# Patient Record
Sex: Female | Born: 1945
Health system: Southern US, Community
[De-identification: ages and names within clinical notes are randomized; demographics above are authoritative.]

## PROBLEM LIST (undated history)

## (undated) DIAGNOSIS — G709 Myoneural disorder, unspecified: Secondary | ICD-10-CM

## (undated) DIAGNOSIS — E785 Hyperlipidemia, unspecified: Secondary | ICD-10-CM

## (undated) DIAGNOSIS — I10 Essential (primary) hypertension: Secondary | ICD-10-CM

## (undated) DIAGNOSIS — Z87442 Personal history of urinary calculi: Secondary | ICD-10-CM

## (undated) DIAGNOSIS — K219 Gastro-esophageal reflux disease without esophagitis: Secondary | ICD-10-CM

## (undated) DIAGNOSIS — H269 Unspecified cataract: Secondary | ICD-10-CM

## (undated) DIAGNOSIS — R011 Cardiac murmur, unspecified: Secondary | ICD-10-CM

## (undated) DIAGNOSIS — M1990 Unspecified osteoarthritis, unspecified site: Secondary | ICD-10-CM

## (undated) DIAGNOSIS — D649 Anemia, unspecified: Secondary | ICD-10-CM

## (undated) DIAGNOSIS — E119 Type 2 diabetes mellitus without complications: Secondary | ICD-10-CM

## (undated) HISTORY — DX: Unspecified cataract: H26.9

## (undated) HISTORY — PX: COLONOSCOPY: SHX174

## (undated) HISTORY — DX: Cardiac murmur, unspecified: R01.1

## (undated) HISTORY — DX: Anemia, unspecified: D64.9

## (undated) HISTORY — PX: BLADDER REPAIR: SHX76

## (undated) HISTORY — DX: Type 2 diabetes mellitus without complications: E11.9

## (undated) HISTORY — DX: Myoneural disorder, unspecified: G70.9

## (undated) HISTORY — DX: Hyperlipidemia, unspecified: E78.5

## (undated) HISTORY — PX: EYE SURGERY: SHX253

## (undated) HISTORY — PX: TUBAL LIGATION: SHX77

## (undated) HISTORY — DX: Gastro-esophageal reflux disease without esophagitis: K21.9

## (undated) HISTORY — DX: Unspecified osteoarthritis, unspecified site: M19.90

## (undated) HISTORY — DX: Essential (primary) hypertension: I10

## (undated) HISTORY — PX: POLYPECTOMY: SHX149

## (undated) HISTORY — PX: CATARACT EXTRACTION W/ INTRAOCULAR LENS IMPLANT: SHX1309

---
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
## 1976-11-17 HISTORY — PX: APPENDECTOMY: SHX54

## 1976-11-17 HISTORY — PX: VAGINAL HYSTERECTOMY: SUR661

## 2000-10-15 ENCOUNTER — Other Ambulatory Visit: Admission: RE | Admit: 2000-10-15 | Discharge: 2000-10-15 | Payer: Self-pay | Admitting: Obstetrics and Gynecology

## 2002-01-20 ENCOUNTER — Other Ambulatory Visit: Admission: RE | Admit: 2002-01-20 | Discharge: 2002-01-20 | Payer: Self-pay | Admitting: Obstetrics and Gynecology

## 2009-04-26 ENCOUNTER — Encounter: Admission: RE | Admit: 2009-04-26 | Discharge: 2009-04-26 | Payer: Self-pay | Admitting: Obstetrics and Gynecology

## 2010-05-14 ENCOUNTER — Encounter: Admission: RE | Admit: 2010-05-14 | Discharge: 2010-05-14 | Payer: Self-pay | Admitting: Obstetrics and Gynecology

## 2011-06-03 ENCOUNTER — Encounter (HOSPITAL_COMMUNITY): Payer: Medicare Other

## 2011-06-03 ENCOUNTER — Other Ambulatory Visit: Payer: Self-pay | Admitting: Obstetrics and Gynecology

## 2011-06-03 LAB — CBC
HCT: 40.7 % (ref 36.0–46.0)
Hemoglobin: 13.4 g/dL (ref 12.0–15.0)
MCH: 28.4 pg (ref 26.0–34.0)
MCHC: 32.9 g/dL (ref 30.0–36.0)
MCV: 86.2 fL (ref 78.0–100.0)
Platelets: 321 10*3/uL (ref 150–400)
RBC: 4.72 MIL/uL (ref 3.87–5.11)
RDW: 13.1 % (ref 11.5–15.5)
WBC: 7.3 10*3/uL (ref 4.0–10.5)

## 2011-06-03 LAB — COMPREHENSIVE METABOLIC PANEL
AST: 11 U/L (ref 0–37)
Albumin: 3.4 g/dL — ABNORMAL LOW (ref 3.5–5.2)
GFR calc non Af Amer: >60 mL/min (ref >60)
Potassium: 4.2 mEq/L (ref 3.5–5.1)
Total Bilirubin: 0.3 mg/dL (ref 0.3–1.2)

## 2011-06-09 ENCOUNTER — Ambulatory Visit (HOSPITAL_COMMUNITY)
Admission: RE | Admit: 2011-06-09 | Discharge: 2011-06-10 | Disposition: A | Discharge status: Home / Self Care | Payer: Medicare Other | Source: Ambulatory Visit | Attending: Obstetrics and Gynecology | Admitting: Obstetrics and Gynecology

## 2011-06-09 DIAGNOSIS — F17200 Nicotine dependence, unspecified, uncomplicated: Secondary | ICD-10-CM | POA: Insufficient documentation

## 2011-06-09 DIAGNOSIS — N210 Calculus in bladder: Secondary | ICD-10-CM | POA: Insufficient documentation

## 2011-06-09 DIAGNOSIS — Z01812 Encounter for preprocedural laboratory examination: Secondary | ICD-10-CM | POA: Insufficient documentation

## 2011-06-09 DIAGNOSIS — N8111 Cystocele, midline: Secondary | ICD-10-CM | POA: Insufficient documentation

## 2011-06-09 DIAGNOSIS — N393 Stress incontinence (female) (male): Secondary | ICD-10-CM | POA: Insufficient documentation

## 2011-06-09 DIAGNOSIS — N816 Rectocele: Secondary | ICD-10-CM | POA: Insufficient documentation

## 2011-06-09 DIAGNOSIS — Z90710 Acquired absence of both cervix and uterus: Secondary | ICD-10-CM | POA: Insufficient documentation

## 2011-06-09 NOTE — H&P (Signed)
  NAMESANTANNA, Lindsay Villarreal NO.:  [id number]  MEDICAL RECORD NO.:  [id number]  LOCATION:  PADM                         FACILITY:  [facility]  PHYSICIAN:  Huel Cote, M.D. DATE OF BIRTH:  06-25-1946  DATE OF ADMISSION:  06/03/2011 DATE OF DISCHARGE:                             HISTORY & PHYSICAL   Surgery is taken place at the [facility] at 7:30 a.m. on June 09, 2011.  HISTORY OF PRESENT ILLNESS:  The patient is a 65 year old G3, P3, who is coming in for a scheduled anterior-posterior repair and placement of a suburethral sling for issues with a large cystocele and stress urinary incontinence.  The patient has been having these issues for years; however, recently stress urinary incontinence has become worse and the patient has to wear a pad daily.  She had a workup for this problem with Dr. Ihor Gully and he agrees that a sling placement is warranted.  She also has a very large cystocele causing pressure and symptoms, and is not sexually active nor plans to be in the future.  PAST MEDICAL HISTORY:  None.  PAST SURGICAL HISTORY:  Vaginal hysterectomy in 1978 for prolapse.  PAST OB HISTORY:  NSVD x3.  PAST GYN HISTORY: 1. No abnormal Pap smears. 2. Cystocele as stated.  FAMILY HISTORY:  Breast cancer in her sister in her 15s.  No colon cancer.  Mother had MI.  SOCIAL HISTORY:  She is a smoker of a half-a-pack a day and is divorced and not currently sexually active.  PHYSICAL EXAMINATION:  VITAL SIGNS:  On physical exam, her blood pressure is 130/80. CARDIAC EXAM:  Regular rate and rhythm. LUNGS:  Clear. ABDOMEN:  Soft and nontender. PELVIC EXAM:  She has a visible cystocele, bulging out of the vagina, grade 4.  External genitalia are normal.  There is some support at her vaginal cuff with mild prolapse there as well. RECTAL:  Shows a mild-to-moderate rectocele with no masses.  IMPRESSION/PLAN:  The patient was counseled as to the risks and  benefits of surgery including bleeding, infection, and possible damage to bowel or bladder.  She has had a separate workup for her stress urinary incontinence and will have a sling placed at the time of our procedure. We did discuss possible concurrent sacrospinous ligament fixation; however, given that she is not sexually active nor ever plans to be.  We discussed that we can probably be aggressive with her anterior repair and if the vagina is somewhat shortened, it will not be a problem for her and she is agreeable with this plan.  We will proceed with surgery as stated at [facility], 7:30 a.m. on June 09, 2011.     Huel Cote, M.D.     KR/MEDQ  D:  06/06/2011  T:  06/06/2011  Job:  [id number]  Electronically Signed by Huel Cote M.D. on 06/09/2011 05:37:24 PM

## 2011-06-09 NOTE — Op Note (Signed)
Lindsay Villarreal, BLOUGH NO.:  [id number]  MEDICAL RECORD NO.:  [id number]  LOCATION:  DAYL                         FACILITY:  [facility]  PHYSICIAN:  Huel Cote, M.D. DATE OF BIRTH:  1946/08/01  DATE OF PROCEDURE: DATE OF DISCHARGE:                              OPERATIVE REPORT   PREOPERATIVE DIAGNOSES: 1. Grade 4 cystocele. 2. Stress urinary incontinence. 3. Small rectocele.  POSTOPERATIVE DIAGNOSES: 1. Grade 4 cystocele. 2. Stress urinary incontinence. 3. Small rectocele.  PROCEDURE:  Anterior-posterior repair and cystoscopy, transobturator sling placement by Dr. Ihor Gully.  SURGEON:  Huel Cote, M.D. and Veverly Fells. Vernie Ammons, M.D.  ASSISTANT:  Malachi Pro. Ambrose Mantle, M.D.  ANESTHESIA:  General.  FINDINGS:  Grade 4 cystocele and a small rectocele.  SPECIMENS:  None.  COMPLICATIONS:  There were no known complications.  ESTIMATED BLOOD LOSS:  100 mL.  URINE OUTPUT:  Approximately 100 mL, clear urine.  IV FLUIDS:  Approximately 1800 mL LR.  PROCEDURE:  The patient was taken to the operating room where general anesthesia was obtained without difficulty.  She was then prepped and draped in the normal sterile fashion in dorsal lithotomy position.  A weighted speculum was then placed and attention was turned to the vaginal cuff which was grasped with Allis clamps x2.  Once these were placed, the midline was injected with a solution of 0.5% Marcaine with epinephrine all along the midline up to approximately 2 cm below the urethral meatus.  The Mayo scissors were then utilized to open a small portion of the vaginal mucosa at the vaginal cuff.  The Jorgenson scissors were then utilized to dissect along the midline of the vaginal mucosa and separate the vaginal mucosa from the underlying pubovesical fascia.  This was carried up to the level of the urethral meatus again approximately 2 cm below.  Once this was completed, the vaginal mucosa flaps were  reflected laterally and the pubovesical fascia dissected off them and the cystocele reduced medially.  With this, accomplished bilaterally.  0 Vicryl interrupted sutures were utilized in interrupted fashion in figure-of-eight formation to reapproximate the pubovesical fascia.  This was completed and the cystocele completely reduced.  The vaginal mucosa was then trimmed with the Mayo scissors and the midline closed with 2-0 Vicryl.  The first few stitches near the urethral meatus were a figure-of-eight in nature and then a running suture was placed for the posterior portion of the closure.  With this then completed, attention was turned to the posterior vaginal wall.  A very small rectocele was noted and the vaginal mucosa was grasped just inside the introitus with Allis clamps.  This was also injected with a dilute solution of 1% with epinephrine and Marcaine along the midline.  With this completed, the Mayo scissors were utilized to open a small portion of the vaginal mucosa and this was then dissected from the underlying rectovaginal fascia, carried up to the level of the vaginal cuff.  Once this was completed, the mucosa was reflected laterally and the rectovaginal fascia was dissected off it with the Jorgenson scissors. This was completed and the rectocele was reapproximated with interrupted figure-of-eight sutures of 0 Vicryl and a small  rectocele reduced.  Once this was completed, the rectal mucosa was closed with a running switched on 2-0 Vicryl.  At the conclusion of the closure, a rectal exam was performed and found to be negative with a good reduction of small cystorectocele.  The urine in the Foley was clear and at this point, the case was turned over to Dr. Ihor Gully for her obturator sling placement.     Huel Cote, M.D.     KR/MEDQ  D:  06/09/2011  T:  06/09/2011  Job:  [id number]  Electronically Signed by Huel Cote M.D. on 06/09/2011 05:37:26 PM

## 2011-06-10 LAB — CBC
HCT: 35.3 % — ABNORMAL LOW (ref 36.0–46.0)
Hemoglobin: 11.3 g/dL — ABNORMAL LOW (ref 12.0–15.0)
MCH: 27.9 pg (ref 26.0–34.0)
MCHC: 32 g/dL (ref 30.0–36.0)
MCV: 87.2 fL (ref 78.0–100.0)
Platelets: 279 10*3/uL (ref 150–400)
RBC: 4.05 MIL/uL (ref 3.87–5.11)
RDW: 13.2 % (ref 11.5–15.5)
WBC: 12.4 10*3/uL — ABNORMAL HIGH (ref 4.0–10.5)

## 2011-06-10 NOTE — Op Note (Signed)
NAMEBRINDLEY, Lindsay Villarreal                ACCOUNT NO.:  [id number]  MEDICAL RECORD NO.:  [id number]  LOCATION:  DAYL                         FACILITY:  [facility]  PHYSICIAN:  Pauline Trainer C. Vernie Ammons, M.D.  DATE OF BIRTH:  1946/07/01  DATE OF PROCEDURE:  06/09/2011 DATE OF DISCHARGE:                              OPERATIVE REPORT   PREOPERATIVE DIAGNOSES: 1. Stress urinary incontinence. 2. Bladder calculi.  POSTOPERATIVE DIAGNOSES: 1. Stress urinary incontinence. 2. Bladder calculi.  PROCEDURE: 1. Transobturator sling. 2. Cystoscopy with removal of bladder calculi.  SURGEON:  Abrial Arrighi C. Vernie Ammons, M.D.  ANESTHESIA:  General.  SPECIMENS:  Bladder calculi to patient.  DRAINS:  A 16-French Foley catheter.  BLOOD LOSS:  Minimal.  COMPLICATIONS:  None.  INDICATIONS:  The patient is a 65 year old female with significant cystocele and rectocele as well as stress urinary incontinence, documented by urodynamics.  I have discussed the procedure of transobturator sling with the patient as well as its risks, complications, and alternatives.  She understands and has elected to proceed.  DESCRIPTION OF OPERATION:  After informed consent, the patient was brought to the major OR, placed on table, administered general anesthesia and then moved to the dorsal lithotomy position.  Her genitalia and vagina was sterilely prepped and draped and a 16-French Foley catheter was placed in the bladder.  An official time-out was first performed by Dr. Senaida Ores and then she proceeded with her portion of the surgery which included anterior-posterior repair for a grade 4 cystocele and small rectocele.  This was dictated by her separately.  After she had completed her portion of the operation and closed the vaginal incision, I then proceeded with my portion of the procedure and a second time official time-out was then performed.  I injected 0.5% Marcaine with epinephrine submucosally in the midline after palpating  the midurethral region.  After allowing adequate time for epinephrine effect, a midline incision was then made over the midurethra and then the Metzenbaum scissors were used to dissect laterally to the urethra on each side.  I then was able to insinuate my index finger through the incision and could palpate the urethra with a Foley catheter in place as well as the undersurface of the symphysis pubis.  I then palpated the obturator ring and made a Keelin Neville at the medial aspect of the obturator fossa with a marking pen at the level of the clitoris. This was performed on right and left sides and then a stab incision was made in this location.  The bladder was then fully drained and the transobturator trocar was then passed through the skin incision, through the obturator fossa, and then back behind the symphysis pubis, hugging the symphysis and advanced under direct digital guidance out through the midurethral level.  This was performed first on the left and then on the right sides.  I then fixed the sling to the transobturator trocar and withdrew this back through the skin incision.  The Foley catheter was then removed and cystoscopy was performed.  The 22 French cystoscope with 70 degree lens was passed into the bladder and the bladder was fully and systematically inspected.  There were no tumors or inflammatory lesions identified.  Ureteral orifices were of normal configuration and position.  Multiple millimeters and submillimeter stones were seen on the floor of the bladder.  There is no evidence of foreign body or injury to the bladder from the anterior repair.  I used the Microvasive evacuator to then irrigate out all of the small stones.  Reinspection then revealed the bladder was free of all stones and as I inspected the bladder further, I noted no injury from the transobturator trocar and the urethra was also noted be normal, as the scope was removed.  The 16 French Foley catheter  was re-inserted in the bladder and I then placed a forceps beneath the urethra and removed the sheathing from the sling first on the left side and then on the right side.  As I removed the forceps, I noted no tension and the sling laid against the midurethra.  The wound was then irrigated with antibiotic solution and I closed the incision with a running 2-0 Vicryl suture.  A 2-inch iodoform gauze with Estrace cream was then used as a vaginal packing and the Foley catheter was connected to close system drainage.  The patient was awakened and taken to recovery room in stable and satisfactory condition.  She tolerated procedure well with no intraoperative complications.     Taressa Rauh C. Vernie Ammons, M.D.     MCO/MEDQ  D:  06/09/2011  T:  06/09/2011  Job:  [id number]  cc:   Huel Cote, M.D. Fax: [phone]  Electronically Signed by Ihor Gully M.D. on 06/10/2011 10:26:32 AM

## 2011-11-07 ENCOUNTER — Telehealth: Payer: Self-pay | Admitting: Hematology and Oncology

## 2011-11-07 NOTE — Telephone Encounter (Signed)
S/w pt re appt for 1/9 @ 1 pm w/LO.

## 2011-11-13 ENCOUNTER — Telehealth: Payer: Self-pay | Admitting: Hematology and Oncology

## 2011-11-13 NOTE — Telephone Encounter (Signed)
Referred by Dr. Rudi Heap Dx- Low Hgb

## 2011-11-18 HISTORY — PX: COLONOSCOPY: SHX174

## 2011-11-26 ENCOUNTER — Ambulatory Visit (HOSPITAL_BASED_OUTPATIENT_CLINIC_OR_DEPARTMENT_OTHER): Payer: Medicare Other

## 2011-11-26 ENCOUNTER — Telehealth: Payer: Self-pay | Admitting: Hematology and Oncology

## 2011-11-26 ENCOUNTER — Ambulatory Visit (HOSPITAL_BASED_OUTPATIENT_CLINIC_OR_DEPARTMENT_OTHER): Payer: Medicare Other | Admitting: Hematology and Oncology

## 2011-11-26 ENCOUNTER — Ambulatory Visit: Payer: Medicare Other

## 2011-11-26 ENCOUNTER — Other Ambulatory Visit: Payer: Self-pay | Admitting: Hematology and Oncology

## 2011-11-26 ENCOUNTER — Encounter: Payer: Self-pay | Admitting: Internal Medicine

## 2011-11-26 VITALS — BP 143/79 | HR 73 | Temp 98.2°F | Ht 64.5 in | Wt 168.9 lb

## 2011-11-26 DIAGNOSIS — M810 Age-related osteoporosis without current pathological fracture: Secondary | ICD-10-CM

## 2011-11-26 DIAGNOSIS — D649 Anemia, unspecified: Secondary | ICD-10-CM

## 2011-11-26 DIAGNOSIS — E785 Hyperlipidemia, unspecified: Secondary | ICD-10-CM

## 2011-11-26 DIAGNOSIS — E1169 Type 2 diabetes mellitus with other specified complication: Secondary | ICD-10-CM | POA: Insufficient documentation

## 2011-11-26 LAB — CBC WITH DIFFERENTIAL/PLATELET
Basophils Absolute: 0 10*3/uL (ref 0.0–0.1)
EOS%: 1 % (ref 0.0–7.0)
Eosinophils Absolute: 0.1 10*3/uL (ref 0.0–0.5)
MCHC: 32.2 g/dL (ref 31.5–36.0)
NEUT#: 4.9 10*3/uL (ref 1.5–6.5)
NEUT%: 64.4 % (ref 38.4–76.8)
RDW: 16.3 % — ABNORMAL HIGH (ref 11.2–14.5)
WBC: 7.6 10*3/uL (ref 3.9–10.3)

## 2011-11-26 LAB — COMPREHENSIVE METABOLIC PANEL
ALT: 12 U/L (ref 0–35)
Calcium: 9.3 mg/dL (ref 8.4–10.5)
Chloride: 101 mEq/L (ref 96–112)
Glucose, Bld: 100 mg/dL — ABNORMAL HIGH (ref 70–99)
Potassium: 3.5 mEq/L (ref 3.5–5.3)
Sodium: 137 mEq/L (ref 135–145)
Total Bilirubin: 0.2 mg/dL — ABNORMAL LOW (ref 0.3–1.2)

## 2011-11-26 LAB — URINALYSIS, MICROSCOPIC - CHCC
Bilirubin (Urine): NEGATIVE
Glucose: NEGATIVE g/dL
Leukocyte Esterase: NEGATIVE
pH: 6 (ref 4.6–8.0)

## 2011-11-26 LAB — MORPHOLOGY
PLT EST: ADEQUATE
RBC Comments: NORMAL

## 2011-11-26 NOTE — Telephone Encounter (Signed)
gv pt appt for 1/29. Per LO 1/29 @ 9:15 am w/RJ not 1/15. Also gv pt appts for colonoscopy and nurse visit @ [facility] for 1/14 and 1/24.

## 2011-11-26 NOTE — Progress Notes (Signed)
CC:   Lindsay Villarreal, M.D. Lindsay Floor, NP  REFERRING PHYSICIAN:  Ernestina Villarreal, M.D.  IDENTIFYING STATEMENT:  The patient is a 66 year old woman seen at request of Dr. Christell Constant with anemia.  HISTORY OF PRESENT ILLNESS:  The patient states that 6 weeks ago that she was told that she had anemia. On 10/29/2011, office visit notes reports a CBC results as follows: White blood cells 6.4, hemoglobin 10.6, hematocrit 34.2, and platelets 348.  The patient was subsequently placed on prescription iron which she has tolerated with very minimal complaints.  She has noted declining energy levels over the last year, but is able to carry on with her activities of daily living.  She denies weight loss.  She denies adenopathy.  She denies noting rectal bleeding or hematuria.  She states she eats a well-balanced diet.  Review of CBCs 9 months ago.  05/24/2011 white count was 7.3, hemoglobin 13.4, hematocrit 40.7, and platelets 321.  PAST MEDICAL HISTORY: 1. GERD. 2. History of hyperlipidemia. 3. History of right cataract surgery. 4. Status post hysterectomy.  ALLERGIES:  Codeine derivatives.  MEDICATIONS:  Omeprazole 40 mg daily, Lipitor 40 mg daily, and ferrous fumarate 1 tablets daily.  SOCIAL HISTORY:  The patient is divorced with 3 children.  She lives in [place], [place].  She is a smoker and smoked half-a-pack a day for 50 years.  She denies alcohol use.  She is a retired Arboriculturist.  FAMILY HISTORY:  The patient's father had stomach cancer.  The patient's sister had breast cancer.  There is no family history for colon cancer or anemia.  HEALTH MAINTENANCE:  Her last colonoscopy was roughly 6-7 years ago.  REVIEW OF SYSTEMS:  Constitutional: Denies fever, chills, night sweats, anorexia, weight loss, or pain.  GI: Denies nausea, vomiting, abdominal pain, diarrhea, melena, or hematochezia.  Cardiovascular: Denies chest pain, PND, orthopnea, or ankle swelling.   Respirations: Denies cough, hemoptysis, wheeze, or shortness of breath.  Skin: No bruising or bleeding.  Musculoskeletal: Denies joint aches or muscle pains. Neurologic: Denies headache, vision changes, or extremity weakness.  The rest of the review of systems negative.  PHYSICAL EXAM:  Vitals: Pulse 73, blood pressure 143/79, temperature 98.2, respirations 20, and weight 168 pounds.  HEENT: Head is atraumatic, normocephalic.  Extraocular muscles intact.  Sclerae anicteric.  Mouth: Moist without thrush.  Neck:  Supple.  Chest:  Clear to percussion and auscultation.  CVS: 1st and 2nd heart sounds present. No added sounds or murmurs.  Abdomen:  Soft, nontender.  No hepatosplenomegaly.  Bowel sounds present.  Lymph nodes: No adenopathy. Extremities: No calf tenderness.  Pulses present and symmetrical.  CNS: Nonfocal.  IMPRESSION AND PLAN:  Lindsay Villarreal is a pleasant 66 year old woman who was recently found to be anemic. She was placed on prescription iron 6 weeks ago.  Her last colonoscopy was 6 years ago.  She denies history for overt blood loss.  She is not that symptomatic.  She will receive an anemia workup which will include repeating her CBC and differential.  We will review morphology.  We will obtain iron studies.  We will rule out hemolysis with Coombs and haptoglobin.  We will rule out plasma cell dyscrasia.  She also requires a urinalysis.  She has not had a colonoscopy in a while. She has seen the Bowel/GI in the past and thus will assist her with a referral.  She is agreeable to this.  In the interim, I have asked that she continue  with iron which she is tolerating well.  She follows up to discuss results and any additional recommendations if needed.  She had a number of questions which I answered.  Spent more than half the time discussing and coordinating care.    ______________________________ Laurice Record, M.D. LIO/MEDQ  D:  11/26/2011  T:  11/26/2011  Job:   [id number]

## 2011-11-26 NOTE — Progress Notes (Signed)
Dr.     Rudi Heap      -      Primary @ [facility]  [facility]icine. Dr.     Huel Cote       -        GYN. Dr.    Raylene Miyamoto        -      Urologist.   [facility]    Pharmacy   In    [place].  Cell      Phone       [phone]  Leave  Messages  On   HOME   Voice  Mail      -    No  Cell    Voice   Mail.

## 2011-11-26 NOTE — Progress Notes (Signed)
This office note has been dictated.

## 2011-12-01 ENCOUNTER — Ambulatory Visit (AMBULATORY_SURGERY_CENTER): Payer: Medicare Other | Admitting: *Deleted

## 2011-12-01 ENCOUNTER — Telehealth: Payer: Self-pay | Admitting: *Deleted

## 2011-12-01 VITALS — Ht 64.0 in | Wt 170.0 lb

## 2011-12-01 DIAGNOSIS — Z1211 Encounter for screening for malignant neoplasm of colon: Secondary | ICD-10-CM | POA: Diagnosis not present

## 2011-12-01 LAB — SPEP & IFE WITH QIG
Albumin ELP: 54.4 % — ABNORMAL LOW (ref 55.8–66.1)
Alpha-2-Globulin: 14.9 % — ABNORMAL HIGH (ref 7.1–11.8)
Beta 2: 5.4 % (ref 3.2–6.5)
Beta Globulin: 6.9 % (ref 4.7–7.2)
Gamma Globulin: 14.2 % (ref 11.1–18.8)

## 2011-12-01 LAB — HAPTOGLOBIN: Haptoglobin: 261 mg/dL — ABNORMAL HIGH (ref 30–200)

## 2011-12-01 LAB — DIRECT ANTIGLOBULIN TEST (NOT AT ARMC)
DAT (Complement): NEGATIVE
DAT IgG: NEGATIVE

## 2011-12-01 MED ORDER — PEG-KCL-NACL-NASULF-NA ASC-C 100 G PO SOLR: ORAL | Status: DC

## 2011-12-01 NOTE — Telephone Encounter (Signed)
Pt instructed to stop Iron supplement 12/04/11

## 2011-12-11 ENCOUNTER — Encounter: Payer: Self-pay | Admitting: Internal Medicine

## 2011-12-11 ENCOUNTER — Ambulatory Visit (AMBULATORY_SURGERY_CENTER): Payer: Medicare Other | Admitting: Internal Medicine

## 2011-12-11 VITALS — BP 145/77 | HR 74 | Temp 96.5°F | Resp 96 | Ht 64.0 in | Wt 170.0 lb

## 2011-12-11 DIAGNOSIS — D126 Benign neoplasm of colon, unspecified: Secondary | ICD-10-CM | POA: Diagnosis not present

## 2011-12-11 DIAGNOSIS — Z1211 Encounter for screening for malignant neoplasm of colon: Secondary | ICD-10-CM | POA: Diagnosis not present

## 2011-12-11 MED ORDER — SODIUM CHLORIDE 0.9 % IV SOLN: 500.0000 mL | INTRAVENOUS | Status: DC

## 2011-12-11 NOTE — Patient Instructions (Signed)
Green and blue discharge instructions reviewed with patient and care partner.  Impression/recommendations:  Polyps (handout given) Hemorrhoids (handout given)  Repeat colonoscopy in 3 years.  Resume medications as you were taking them prior to your procedure.

## 2011-12-11 NOTE — Progress Notes (Signed)
Patient did not experience any of the following events: a burn prior to discharge; a fall within the facility; wrong site/side/patient/procedure/implant event; or a hospital transfer or hospital admission upon discharge from the facility. (G8907) Patient did not have preoperative order for IV antibiotic SSI prophylaxis. (G8918)  

## 2011-12-11 NOTE — Op Note (Signed)
[facility] [address] [place], [place]  [address]  COLONOSCOPY PROCEDURE REPORT  PATIENT:  Lindsay Villarreal, Lindsay Villarreal  MR#:  [id number] BIRTHDATE:  [birth year], 65 yrs. old  GENDER:  female ENDOSCOPIST:  Wilhemina Bonito. Eda Keys, MD REF. BY:  Arlan Organ, M.D. PROCEDURE DATE:  12/11/2011 PROCEDURE:  Colonoscopy with snare polypectomy x 8 ASA CLASS:  Class II INDICATIONS:  Routine Risk Screening (negative exam 2003); normocytic anemia MEDICATIONS:   MAC sedation, administered by CRNA, propofol (Diprivan) 420 mcg IV  DESCRIPTION OF PROCEDURE:   After the risks benefits and alternatives of the procedure were thoroughly explained, informed consent was obtained.  Digital rectal exam was performed and revealed no abnormalities.   The LB CF-H180AL [id number] endoscope was introduced through the anus and advanced to the cecum, which was identified by both the appendix and ileocecal valve, without limitations.  The quality of the prep was excellent, using MoviPrep.  The instrument was then slowly withdrawn as the colon was fully examined. <<PROCEDUREIMAGES>>  FINDINGS:  There were multiple polyps, all < 6mm, identified and removed from the ascending (1), transverse (5), descending (1) and sigmoid colon (1). Polyps were snared without cautery. Retrieval was successful.  Otherwise normal colonoscopy without other polyps, masses, vascular ectasias, or inflammatory changes. Retroflexed views in the rectum revealed internal hemorrhoids. The time to cecum =  3:03  minutes. The scope was then withdrawn in 21:35  minutes from the cecum and the procedure completed.  COMPLICATIONS:  None  ENDOSCOPIC IMPRESSION: 1) Polyps, multiple ascending colon to sigmoid colon - removed 2) Otherwise normal colonoscopy 3) Internal hemorrhoids  RECOMMENDATIONS: 1) Repeat Colonoscopy in 3 years.  ______________________________ Wilhemina Bonito. Eda Keys, MD  CC:  Arlan Organ, MD; Rudi Heap, MD; The  Patient  n. eSIGNED:   Wilhemina Bonito. Eda Keys at 12/11/2011 10:15 AM  Paulla Dolly, [phone]

## 2011-12-12 ENCOUNTER — Telehealth: Payer: Self-pay | Admitting: *Deleted

## 2011-12-12 NOTE — Telephone Encounter (Signed)

## 2011-12-16 ENCOUNTER — Ambulatory Visit (HOSPITAL_BASED_OUTPATIENT_CLINIC_OR_DEPARTMENT_OTHER): Payer: Medicare Other | Admitting: Lab

## 2011-12-16 ENCOUNTER — Ambulatory Visit (HOSPITAL_BASED_OUTPATIENT_CLINIC_OR_DEPARTMENT_OTHER): Payer: Medicare Other | Admitting: Physician Assistant

## 2011-12-16 ENCOUNTER — Encounter: Payer: Self-pay | Admitting: Internal Medicine

## 2011-12-16 VITALS — BP 139/77 | HR 77 | Temp 97.7°F | Ht 64.0 in | Wt 170.4 lb

## 2011-12-16 DIAGNOSIS — E785 Hyperlipidemia, unspecified: Secondary | ICD-10-CM | POA: Diagnosis not present

## 2011-12-16 DIAGNOSIS — D649 Anemia, unspecified: Secondary | ICD-10-CM

## 2011-12-16 DIAGNOSIS — F17200 Nicotine dependence, unspecified, uncomplicated: Secondary | ICD-10-CM | POA: Diagnosis not present

## 2011-12-16 LAB — IRON AND TIBC
%SAT: 13 % — ABNORMAL LOW (ref 20–55)
Iron: 49 ug/dL (ref 42–145)
UIBC: 334 ug/dL (ref 125–400)

## 2011-12-16 LAB — FERRITIN: Ferritin: 26 ng/mL (ref 10–291)

## 2011-12-16 NOTE — Progress Notes (Signed)
CC:   Ernestina Penna, M.D. Paulene Floor, NP  IDENTIFYING STATEMENT:  Lindsay Villarreal is a 66 year old white female with anemia who presents for followup.  INTERIM HISTORY:  Ms. Delira reports since her last clinic visit on July 27, 2011 she did have a colonoscopy under the care of Dr. Yancey Flemings.  This was on December 11, 2011.  The colonoscopy revealed multiple polyps in the ascending colon to sigmoid colon which were removed, otherwise normal colonoscopy.  There were also internal hemorrhoids. Recommendation was for repeat colonoscopy in 3 years time.  The patient states that she had to stop taking her oral iron for a week prior to this procedure, but she has resumed iron 1 tab p.o. daily and is tolerating this without any issues with nausea, abdominal pain or constipation.  She has not noted any rectal bleeding.  She does report overall normal energy level and has had no fevers, chills, or night sweats.  She does report occasional dyspnea with exertion, but states that she does continue to smoke approximately 1/2 pack of cigarettes per day.  She has had normal appetite, no dysuria, no frequency or hematuria.  No alteration in sensation or balance or swelling of extremities.  Current medications are reviewed and recorded.  PHYSICAL EXAM:  Temperature is 97.7, heart rate 77, respirations 20, blood pressure 139/77, weight 170.4 pounds.  General:  This is a well- developed, well-nourished white female, in no acute distress.  HEENT: Sclerae nonicteric.  There is no oral thrush or mucositis.  Skin:  No rashes or lesions.  Lymph:  No cervical, supraclavicular, axillary, or inguinal lymphadenopathy.  Cardiac:  Regular rate and rhythm without murmurs or gallops.  Peripheral pulses are 2+.  Chest:  Lungs clear to auscultation.  Abdomen:  Positive bowel sounds, soft, nontender, nondistended.  No organomegaly.  Extremities:  Without edema, cyanosis or calf tenderness.  Neurologic:  Alert and  oriented x3.  Strength, sensation, and coordination all grossly intact.  LABORATORY STUDIES:  Laboratory data from December 16, 2011 CBC with diff revealed white blood count of 7.6, hemoglobin 12.2, hematocrit 37.9, platelets of 346, ANC of 4.9, and MCV of 81.  Chemistries revealed a sodium of 137, potassium 3.5, chloride 101, BUN of 9, creatinine 0.67, glucose of 100, bilirubin 0.2, alkaline phosphatase 110, AST 13, ALT 12, total protein 7.3, albumin 3.7, and calcium of 9.3.  Quantitative immunoglobulins revealed an IgA of 264, IgG of 1150, IgM of 65.  M spike was not detected.  ISE interpretation:  No monoclonal protein identified.  Haptoglobin of 261.  IMPRESSION/PLAN: 1. Lindsay Villarreal is a 66 year old white female referred to our office     by Dr. Christell Constant for evaluation of anemia.  The patient has recently     had a colonoscopy which did not reveal any evidence of malignancy.     She is currently on oral iron 1 tab p.o. daily although she has     missed a week of this to prepare for her colonoscopy.  Per Dr.     Dalene Carrow, we will obtain a ferritin and iron IBC today and the     patient will be contacted with results.  She will also be advised     if she needs to take more iron than just 1 tab p.o. daily, but for     now, she is to continue on current dose. 2. Also per Dr. Dalene Carrow, the patient will be scheduled for followup in     6  months' time.  A few days before this we     will reassess CBC with diff, CMET, ferritin, iron IBC.  Patient is     advised to call in the interim if any questions or problems.     Patient is also counseled on benefit of cigarette smoking     cessation.    ______________________________ Michail Sermon, MSN, ANP, BC RH/MEDQ  D:  12/16/2011  T:  12/16/2011  Job:  [id number]

## 2011-12-16 NOTE — Progress Notes (Signed)
This office note has been dictated.

## 2011-12-29 DIAGNOSIS — J069 Acute upper respiratory infection, unspecified: Secondary | ICD-10-CM | POA: Diagnosis not present

## 2011-12-29 DIAGNOSIS — R070 Pain in throat: Secondary | ICD-10-CM | POA: Diagnosis not present

## 2012-02-02 DIAGNOSIS — T148 Other injury of unspecified body region: Secondary | ICD-10-CM | POA: Diagnosis not present

## 2012-02-02 DIAGNOSIS — T148XXA Other injury of unspecified body region, initial encounter: Secondary | ICD-10-CM | POA: Diagnosis not present

## 2012-02-02 DIAGNOSIS — W57XXXA Bitten or stung by nonvenomous insect and other nonvenomous arthropods, initial encounter: Secondary | ICD-10-CM | POA: Diagnosis not present

## 2012-04-26 DIAGNOSIS — Z961 Presence of intraocular lens: Secondary | ICD-10-CM | POA: Diagnosis not present

## 2012-04-26 DIAGNOSIS — H2510 Age-related nuclear cataract, unspecified eye: Secondary | ICD-10-CM | POA: Diagnosis not present

## 2012-05-06 DIAGNOSIS — H2510 Age-related nuclear cataract, unspecified eye: Secondary | ICD-10-CM | POA: Diagnosis not present

## 2012-05-24 DIAGNOSIS — H2510 Age-related nuclear cataract, unspecified eye: Secondary | ICD-10-CM | POA: Diagnosis not present

## 2012-05-24 DIAGNOSIS — IMO0002 Reserved for concepts with insufficient information to code with codable children: Secondary | ICD-10-CM | POA: Diagnosis not present

## 2012-06-04 ENCOUNTER — Other Ambulatory Visit: Payer: Medicare Other | Admitting: Lab

## 2012-06-04 ENCOUNTER — Telehealth: Payer: Self-pay | Admitting: Hematology and Oncology

## 2012-06-04 ENCOUNTER — Ambulatory Visit (HOSPITAL_BASED_OUTPATIENT_CLINIC_OR_DEPARTMENT_OTHER): Payer: Medicare Other | Admitting: Lab

## 2012-06-04 DIAGNOSIS — D649 Anemia, unspecified: Secondary | ICD-10-CM | POA: Diagnosis not present

## 2012-06-04 LAB — CBC WITH DIFFERENTIAL/PLATELET
BASO%: 1.1 % (ref 0.0–2.0)
EOS%: 1 % (ref 0.0–7.0)
MCH: 28.6 pg (ref 25.1–34.0)
MCHC: 33.2 g/dL (ref 31.5–36.0)
RDW: 13.7 % (ref 11.2–14.5)
lymph#: 1.9 10*3/uL (ref 0.9–3.3)

## 2012-06-04 LAB — BASIC METABOLIC PANEL
Chloride: 103 mEq/L (ref 96–112)
Creatinine, Ser: 0.69 mg/dL (ref 0.50–1.10)
Potassium: 4.4 mEq/L (ref 3.5–5.3)

## 2012-06-04 LAB — IRON AND TIBC
Iron: 69 ug/dL (ref 42–145)
UIBC: 313 ug/dL (ref 125–400)

## 2012-06-04 NOTE — Telephone Encounter (Signed)
Pt came in and a new sch was printed for her

## 2012-06-08 ENCOUNTER — Ambulatory Visit: Payer: Medicare Other | Admitting: Hematology and Oncology

## 2012-06-14 ENCOUNTER — Telehealth: Payer: Self-pay | Admitting: Hematology and Oncology

## 2012-06-14 NOTE — Telephone Encounter (Signed)
per email from thu moved f/u from 7/30 to 8/2. S/w pt she is aware.

## 2012-06-15 ENCOUNTER — Ambulatory Visit: Payer: Medicare Other | Admitting: Family

## 2012-06-15 ENCOUNTER — Other Ambulatory Visit: Payer: Medicare Other | Admitting: Lab

## 2012-06-18 ENCOUNTER — Ambulatory Visit (HOSPITAL_BASED_OUTPATIENT_CLINIC_OR_DEPARTMENT_OTHER): Payer: Medicare Other | Admitting: Family

## 2012-06-18 ENCOUNTER — Telehealth: Payer: Self-pay | Admitting: Hematology and Oncology

## 2012-06-18 ENCOUNTER — Encounter: Payer: Self-pay | Admitting: Family

## 2012-06-18 VITALS — BP 146/83 | HR 80 | Temp 98.9°F | Resp 18 | Ht 64.0 in | Wt 174.2 lb

## 2012-06-18 DIAGNOSIS — D649 Anemia, unspecified: Secondary | ICD-10-CM

## 2012-06-18 MED ORDER — FERROUS FUMARATE 325 (106 FE) MG PO TABS: 1.0000 | ORAL_TABLET | Freq: Every day | ORAL | Status: DC

## 2012-06-18 NOTE — Patient Instructions (Addendum)
Patient ID: Lindsay Villarreal,   DOB: 06-28-1946,  MRN: [id number]   [facility] Discharge Instructions  RECOMMENDATIONS MAD BY THE CONSULTANT AND ANY TEST RESULT(S) WILL BE FORWARDED TO YOU REFERRING DOCTOR   EXAM FINDINGS BY NURSE PRACTITIONER TODAY TO REPORT TO THE CLINIC OR PRIMARY PROVIDER: N/A   Your Current Medications Are: Current Outpatient Prescriptions  Medication Sig Dispense Refill  . ferrous fumarate (HEMOCYTE - 106 MG FE) 325 (106 FE) MG TABS Take 1 tablet by mouth daily.       Current Facility-Administered Medications  Medication Dose Route Frequency Provider Last Rate Last Dose  . 0.9 %  sodium chloride infusion  500 mL Intravenous Continuous Hilarie Fredrickson, MD         INSTRUCTIONS GIVEN, DISCUSSED AND FOLLOW-UP: Take all medications as prescribed Please follow up with Dr. Christell Constant or Paulene Floor, NP regarding your medications, a physical and possibly a chest x-ray. Work on smoking cessation please.  I acknowledge that I have been informed and understand all the instructions given to me and have received a copy.  I do not have any further questions at this time, but I understand that I may call the [facility] at [phone] during business hours should I have any further questions or need assistance in obtaining follow-up care.   06/18/2012, 1:41 PM

## 2012-06-18 NOTE — Progress Notes (Signed)
Patient ID: Lindsay Villarreal, female   DOB: 21-Nov-1945, 66 y.o.   MRN: [id number] CSN: [id number]  Identifying Statement: Lindsay Villarreal is a 66 y.o. Caucasian female who presents for anemia follow-up.  CC: Lindsay Villarreal, M.D.  Lindsay Floor, NP  Interval History: Lindsay Villarreal reports since her last clinic visit on December 16, 2011 she has experienced and increase in energy.  The patient states that she has not had any unusual bleeding or bruising.  When reviewing the patient's medications with her, she stated that she has not taken her iron pills in 3 months because she ran out of the medication.  The patient has been non-compliant with other medications as well that were prescribed by her PCP.  The patient states that she has had a mammogram and a colonoscopy this year, both of which were negative. (NOTE:  Colonoscopy results are listed below, mammogram results have not been received).  The patient also states that she received both the pneumonia and influenza vaccines in 2012.  The patient stated that she had right eye cataract surgery in July, 2013.  The patient and I discussed smoking cessation and I let the patient know that there are numerous resources available to her when she is ready to quit smoking.  The patient denies any other symptomatology or complaints.   Medications: Current Outpatient Prescriptions  Medication Sig Dispense Refill  . ferrous fumarate (HEMOCYTE - 106 MG FE) 325 (106 FE) MG TABS Take 1 tablet (106 mg of iron total) by mouth daily.  30 each  6  . DISCONTD: ferrous fumarate (HEMOCYTE - 106 MG FE) 325 (106 FE) MG TABS Take 1 tablet by mouth daily.       Current Facility-Administered Medications  Medication Dose Route Frequency Provider Last Rate Last Dose  . 0.9 %  sodium chloride infusion  500 mL Intravenous Continuous Hilarie Fredrickson, MD        Allergies: Allergies  Allergen Reactions  . Codeine Other (See Comments)    Hallucinations.     Family History: Family  History  Problem Relation Age of Onset  . Stomach cancer Father   . Pancreatic cancer Father   . Liver cancer Father   . Other Sister 45    Cancer of Bile Duct  . Colon cancer Neg Hx   . Breast cancer Sister     Social History: History  Substance Use Topics  . Smoking status: Current Everyday Smoker -- 0.5 packs/day  . Smokeless tobacco: Never Used  . Alcohol Use: No    Review of Systems: 10 point review of systems was completed and is negative except as noted above.   Physical Exam: Blood pressure 146/83, pulse 80, temperature 98.9 F (37.2 C), temperature source Oral, resp. rate 18, height 5\' 4"  (1.626 m), weight 174 lb 3.2 oz (79.017 kg).  General appearance: Alert, cooperative, well nourished, well developed, no apparent distress Head: Normocephalic, without obvious abnormality, atraumatic Eyes: Conjunctivae/corneas clear,PERRLA, EOMI  Nose: Nares, septum and mucosa are normal, no drainage or sinus tenderness Neck: No adenopathy, supple, symmetrical, trachea midline, skin around thyroid area has adipose, but thyroid is not enlarged, no tenderness Resp: Diminished breath sounds LLL, CTA Cardio: Regular rate and rhythm, S1, S2 normal, no murmur, click, rub or gallop GI: Soft, non-tender, distended, hypoactive bowel sounds, no organomegaly Extremities: Extremities normal, atraumatic, no cyanosis or edema   Laboratory Data: Sodium 138, potassium 4.4, chloride 103, CO2 27, BUN 10, creatinine 0.69, calcium 9.3, glucose 172,  iron 69, U. IBC 313, TIBC 382, percent sat 18 ferritin 47, WBC 7.0, RBCs 4.79, hemoglobin 13.7, hematocrit 41.3, MCV 86.1, MCH 28.6, MCHC 33.2, RDW 13.7, platelets 295, ANC 4.6  Impression/Plan: Lindsay Villarreal is a 66 year old Caucasian female referred to our office by Dr. Christell Constant for evaluation of anemia. The patient has recently had a colonoscopy which did not reveal any evidence of malignancy, but polyps were removed from the ascend, transverse, descending  and sigmoid colon.  All of the polyps were less than 6 mm. The patient was given a prescription for oral iron 1 tab p.o. Daily.  The patient was asked to take an OTC stool softener and/or Miralax as directed if she experiences constipation while on PO iron supplementation.  The patient has been asked to visit her PCP for a physical, to discuss her running out of her medications and to get a chest x-ray.  The patient is scheduled to follow up in our office in 6 months' time and we will reassess CBC with diff, BMP, Ferritin, Iron/TIBC at that time.  The patient is advised to call in the interim if any questions or problems. Lindsay Villarreal was also counseled on benefit of cigarette smoking cessation.   Lindsay Selway  NP-C 06/18/2012, 1:59 PM

## 2012-06-18 NOTE — Telephone Encounter (Signed)
appts made and printed for pt aom °

## 2012-07-28 DIAGNOSIS — R7989 Other specified abnormal findings of blood chemistry: Secondary | ICD-10-CM | POA: Diagnosis not present

## 2012-07-28 DIAGNOSIS — D510 Vitamin B12 deficiency anemia due to intrinsic factor deficiency: Secondary | ICD-10-CM | POA: Diagnosis not present

## 2012-07-28 DIAGNOSIS — I10 Essential (primary) hypertension: Secondary | ICD-10-CM | POA: Diagnosis not present

## 2012-07-28 DIAGNOSIS — D649 Anemia, unspecified: Secondary | ICD-10-CM | POA: Diagnosis not present

## 2012-07-28 DIAGNOSIS — Z124 Encounter for screening for malignant neoplasm of cervix: Secondary | ICD-10-CM | POA: Diagnosis not present

## 2012-07-28 DIAGNOSIS — E785 Hyperlipidemia, unspecified: Secondary | ICD-10-CM | POA: Diagnosis not present

## 2012-07-28 DIAGNOSIS — Z0000 Encounter for general adult medical examination without abnormal findings: Secondary | ICD-10-CM | POA: Diagnosis not present

## 2012-08-05 DIAGNOSIS — J019 Acute sinusitis, unspecified: Secondary | ICD-10-CM | POA: Diagnosis not present

## 2012-08-05 DIAGNOSIS — E119 Type 2 diabetes mellitus without complications: Secondary | ICD-10-CM | POA: Diagnosis not present

## 2012-09-20 DIAGNOSIS — M545 Low back pain: Secondary | ICD-10-CM | POA: Diagnosis not present

## 2012-10-21 DIAGNOSIS — J209 Acute bronchitis, unspecified: Secondary | ICD-10-CM | POA: Diagnosis not present

## 2012-11-01 DIAGNOSIS — E785 Hyperlipidemia, unspecified: Secondary | ICD-10-CM | POA: Diagnosis not present

## 2012-11-01 DIAGNOSIS — E119 Type 2 diabetes mellitus without complications: Secondary | ICD-10-CM | POA: Diagnosis not present

## 2012-11-20 ENCOUNTER — Telehealth: Payer: Self-pay | Admitting: *Deleted

## 2012-11-20 NOTE — Telephone Encounter (Signed)
Per patient reassignment, I have called and spoke to the patient. Patient aware that Dr. Dalene Carrow will no longer be with the practice. I have explained to the patient that Dr. Dalene Carrow recommands that Dr. Cyndie Chime takes over her care. Patient agrees to see Dr. Cyndie Chime. Appts made and given to the patient. Old appts canceled. JMW

## 2012-11-22 ENCOUNTER — Encounter: Payer: Self-pay | Admitting: Oncology

## 2012-12-01 DIAGNOSIS — M810 Age-related osteoporosis without current pathological fracture: Secondary | ICD-10-CM | POA: Diagnosis not present

## 2012-12-01 LAB — HM DEXA SCAN

## 2012-12-03 DIAGNOSIS — Z1231 Encounter for screening mammogram for malignant neoplasm of breast: Secondary | ICD-10-CM | POA: Diagnosis not present

## 2012-12-21 ENCOUNTER — Other Ambulatory Visit (HOSPITAL_BASED_OUTPATIENT_CLINIC_OR_DEPARTMENT_OTHER): Payer: Medicare Other | Admitting: Lab

## 2012-12-21 ENCOUNTER — Ambulatory Visit (HOSPITAL_BASED_OUTPATIENT_CLINIC_OR_DEPARTMENT_OTHER): Payer: Medicare Other | Admitting: Oncology

## 2012-12-21 VITALS — BP 128/67 | HR 76 | Temp 97.7°F | Resp 20 | Ht 64.0 in | Wt 157.9 lb

## 2012-12-21 DIAGNOSIS — D649 Anemia, unspecified: Secondary | ICD-10-CM

## 2012-12-21 LAB — CBC WITH DIFFERENTIAL/PLATELET
BASO%: 0.6 % (ref 0.0–2.0)
EOS%: 0.8 % (ref 0.0–7.0)
HGB: 13.1 g/dL (ref 11.6–15.9)
MCH: 28.3 pg (ref 25.1–34.0)
MCHC: 32.9 g/dL (ref 31.5–36.0)
MONO#: 0.6 10*3/uL (ref 0.1–0.9)
RDW: 13.7 % (ref 11.2–14.5)
WBC: 8.9 10*3/uL (ref 3.9–10.3)
lymph#: 2.7 10*3/uL (ref 0.9–3.3)

## 2012-12-21 LAB — IRON AND TIBC
Iron: 96 ug/dL (ref 42–145)
TIBC: 374 ug/dL (ref 250–470)
UIBC: 278 ug/dL (ref 125–400)

## 2012-12-21 LAB — BASIC METABOLIC PANEL (CC13)
BUN: 14.4 mg/dL (ref 7.0–26.0)
Calcium: 9.6 mg/dL (ref 8.4–10.4)
Creatinine: 0.8 mg/dL (ref 0.6–1.1)
Glucose: 99 mg/dl (ref 70–99)

## 2012-12-21 NOTE — Progress Notes (Signed)
Hematology and Oncology Follow Up Visit  Lindsay Villarreal [id number] [birth year] 67 y.o. 12/21/2012 6:06 PM   Principle Diagnosis: Encounter Diagnosis  Name Primary?  Lindsay Villarreal Anemia, unspecified Yes     Interim History:  Followup visit for this pleasant 67 year old woman formerly followed in this office by Dr.Odogwu for normochromic anemia felt to be iron deficiency. Her baseline hemoglobin recorded on 06/03/2011 was 13.4 with MCV 86.2. Hemoglobin fell to 10.7 g on 10/14/2011 with repeat value of 10.6,MCV 83.6 on 10/29/2011. Stools were negative for occult blood. She first saw my partner on 12/16/2011. A number of additional studies were done. Serum iron was normal at 49, TIBC 383, percent saturation 13, and ferritin was low normal at 26. Serum protein electrophoresis showed normal quantitative immunoglobulins and no M protein. There was trace blood in the urine. There was no evidence for a hemolytic process. Coombs test was negative. Haptoglobin 261. She was started on iron supplements and referred for a colonoscopy which was done on 12/11/2011. She had multiple benign tubular adenomas which were removed. Internal hemorrhoids which were not bleeding. No other pathology. Followup labs after 6 months on iron replacement done 06/04/2012 now showed hemoglobin 13.7, hematocrit 41.3, MCV 86. Blood counts today 12/21/2012 hemoglobin 13.1, hematocrit 39.7, MCV 85.9, white count 8900, 62 neutrophils, 30 lymphocytes, 6 monocytes, platelet count 328,000. Serum iron 96 with a ferritin of 122.  Abbreviated past history: She has had a previous hysterectomy without oophorectomy. She has type 2 diabetes. Hyperlipidemia.  Social history: She worked for many years with [employer]. She had extensive exposure to insecticides [place] farm at a time when insecticides were sprayed by airplane. She is an ongoing cigarette smoker and admits that she enjoys smoking and will probably not stopp. She has 3 sisters. One  died of bile duct cancer. She has 2 children 2 sons one with diabetes and one daughter.   Medications: reviewed  Allergies:  Allergies  Allergen Reactions  . Codeine Other (See Comments)    Hallucinations.    Review of Systems: Constitutional:   No constitutional symptoms Respiratory: No cough or dyspnea Cardiovascular:  No chest pain or palpitations Gastrointestinal: No abdominal pain, no hematochezia, no melena Genito-Urinary: No hematuria, no vaginal bleeding Musculoskeletal: No muscle bone or joint pain, Neurologic: No headache or change in vision, no paresthesias Skin: No rash or ecchymosis Remaining ROS negative.  Physical Exam: Blood pressure 128/67, pulse 76, temperature 97.7 F (36.5 C), temperature source Oral, resp. rate 20, height 5\' 4"  (1.626 m), weight 157 lb 14.4 oz (71.623 kg). Wt Readings from Last 3 Encounters:  12/21/12 157 lb 14.4 oz (71.623 kg)  06/18/12 174 lb 3.2 oz (79.017 kg)  12/16/11 170 lb 6.4 oz (77.293 kg)     General appearance: Well-nourished Caucasian woman HENNT: Pharynx no erythema or exudate Lymph nodes: No lymphadenopathy Breasts: Lungs: Clear to auscultation resonant to percussion Heart: Regular rhythm no murmur Abdomen: Soft, nontender, no mass, no organomegaly Extremities: No edema, no calf tenderness Vascular: No cyanosis Neurologic: No focal deficit, sensation intact to vibration by tuning fork exam over the fingertips Skin: No rash or ecchymosis   Lab Results: Lab Results  Component Value Date   WBC 8.9 12/21/2012   HGB 13.1 12/21/2012   HCT 39.7 12/21/2012   MCV 85.9 12/21/2012   PLT 328 12/21/2012     Chemistry      Component Value Date/Time   NA 140 12/21/2012 1128   NA 138 06/04/2012 0909  K 4.0 12/21/2012 1128   K 4.4 06/04/2012 0909   CL 103 12/21/2012 1128   CL 103 06/04/2012 0909   CO2 26 12/21/2012 1128   CO2 27 06/04/2012 0909   BUN 14.4 12/21/2012 1128   BUN 10 06/04/2012 0909   CREATININE 0.8 12/21/2012 1128   CREATININE  0.69 06/04/2012 0909      Component Value Date/Time   CALCIUM 9.6 12/21/2012 1128   CALCIUM 9.3 06/04/2012 0909   ALKPHOS 110 11/26/2011 1456   AST 13 11/26/2011 1456   ALT 12 11/26/2011 1456   BILITOT 0.2* 11/26/2011 1456     Impression and Plan: Chronic, mild, stable, normochromic anemia with transient fall in hemoglobin due to to iron depletion and now corrected with oral iron supplements. No additional pathology.  Recommendation: I told her to stay on one iron tablet daily. She was at a multivitamin and this is fine. She has not been a formal followup visit in this office.    CC:. Dr. Rudi Heap   Levert Feinstein, MD 2/4/20146:06 PM

## 2012-12-21 NOTE — Patient Instructions (Signed)
Stay on 1 iron pill daily OK to take a multivitamin Return appointment only as needed if blood counts change in the future

## 2012-12-22 ENCOUNTER — Other Ambulatory Visit: Payer: Self-pay | Admitting: *Deleted

## 2012-12-22 ENCOUNTER — Other Ambulatory Visit: Payer: Medicare Other | Admitting: Lab

## 2012-12-22 DIAGNOSIS — D649 Anemia, unspecified: Secondary | ICD-10-CM

## 2012-12-22 DIAGNOSIS — M810 Age-related osteoporosis without current pathological fracture: Secondary | ICD-10-CM | POA: Diagnosis not present

## 2012-12-22 MED ORDER — POLYSACCHARIDE IRON COMPLEX 150 MG PO CAPS: 150.0000 mg | ORAL_CAPSULE | Freq: Every day | ORAL | Status: DC

## 2012-12-24 ENCOUNTER — Ambulatory Visit: Payer: Medicare Other | Admitting: Hematology and Oncology

## 2013-02-18 ENCOUNTER — Telehealth: Payer: Self-pay | Admitting: Nurse Practitioner

## 2013-02-18 ENCOUNTER — Other Ambulatory Visit: Payer: Self-pay | Admitting: Nurse Practitioner

## 2013-02-18 NOTE — Telephone Encounter (Signed)
PLEASE ADVISE.

## 2013-02-18 NOTE — Telephone Encounter (Signed)
Need chart

## 2013-03-17 ENCOUNTER — Other Ambulatory Visit: Payer: Self-pay | Admitting: Nurse Practitioner

## 2013-03-18 NOTE — Telephone Encounter (Signed)
LAST OV 12/13. LAST AIC 6.7

## 2013-03-31 NOTE — Telephone Encounter (Signed)
rx approved per Paulene Floor NP on 02/18/13

## 2013-04-22 ENCOUNTER — Other Ambulatory Visit: Payer: Self-pay | Admitting: Family Medicine

## 2013-05-05 DIAGNOSIS — L989 Disorder of the skin and subcutaneous tissue, unspecified: Secondary | ICD-10-CM | POA: Diagnosis not present

## 2013-05-05 DIAGNOSIS — L919 Hypertrophic disorder of the skin, unspecified: Secondary | ICD-10-CM | POA: Diagnosis not present

## 2013-05-05 DIAGNOSIS — L909 Atrophic disorder of skin, unspecified: Secondary | ICD-10-CM | POA: Diagnosis not present

## 2013-05-05 DIAGNOSIS — D239 Other benign neoplasm of skin, unspecified: Secondary | ICD-10-CM | POA: Diagnosis not present

## 2013-05-05 DIAGNOSIS — I781 Nevus, non-neoplastic: Secondary | ICD-10-CM | POA: Diagnosis not present

## 2013-05-05 DIAGNOSIS — L821 Other seborrheic keratosis: Secondary | ICD-10-CM | POA: Diagnosis not present

## 2013-05-05 DIAGNOSIS — D485 Neoplasm of uncertain behavior of skin: Secondary | ICD-10-CM | POA: Diagnosis not present

## 2013-05-06 DIAGNOSIS — D2370 Other benign neoplasm of skin of unspecified lower limb, including hip: Secondary | ICD-10-CM | POA: Diagnosis not present

## 2013-05-06 DIAGNOSIS — L821 Other seborrheic keratosis: Secondary | ICD-10-CM | POA: Diagnosis not present

## 2013-05-30 ENCOUNTER — Other Ambulatory Visit: Payer: Self-pay | Admitting: *Deleted

## 2013-05-30 MED ORDER — METFORMIN HCL 500 MG PO TABS: 500.0000 mg | ORAL_TABLET | Freq: Two times a day (BID) | ORAL | Status: DC

## 2013-05-30 MED ORDER — SIMVASTATIN 20 MG PO TABS: 20.0000 mg | ORAL_TABLET | Freq: Every day | ORAL | Status: DC

## 2013-05-30 MED ORDER — LISINOPRIL 5 MG PO TABS: 5.0000 mg | ORAL_TABLET | Freq: Every day | ORAL | Status: DC

## 2013-05-30 NOTE — Telephone Encounter (Signed)
LAST OV 10/21/12. LAST LABS 11/01/12. NTBS

## 2013-05-31 ENCOUNTER — Telehealth: Payer: Self-pay | Admitting: Nurse Practitioner

## 2013-05-31 MED ORDER — SIMVASTATIN 20 MG PO TABS: 20.0000 mg | ORAL_TABLET | Freq: Every day | ORAL | Status: DC

## 2013-05-31 MED ORDER — METFORMIN HCL 500 MG PO TABS: 500.0000 mg | ORAL_TABLET | Freq: Two times a day (BID) | ORAL | Status: DC

## 2013-05-31 MED ORDER — LISINOPRIL 5 MG PO TABS: 5.0000 mg | ORAL_TABLET | Freq: Every day | ORAL | Status: DC

## 2013-05-31 NOTE — Telephone Encounter (Signed)
Ok to rf for #30- has apt this thurs. With mmm

## 2013-06-02 ENCOUNTER — Encounter: Payer: Self-pay | Admitting: Nurse Practitioner

## 2013-06-02 ENCOUNTER — Ambulatory Visit (INDEPENDENT_AMBULATORY_CARE_PROVIDER_SITE_OTHER): Payer: Medicare Other | Admitting: Nurse Practitioner

## 2013-06-02 VITALS — BP 131/80 | HR 65 | Temp 97.0°F | Ht 64.0 in | Wt 152.0 lb

## 2013-06-02 DIAGNOSIS — E785 Hyperlipidemia, unspecified: Secondary | ICD-10-CM | POA: Diagnosis not present

## 2013-06-02 DIAGNOSIS — I10 Essential (primary) hypertension: Secondary | ICD-10-CM

## 2013-06-02 DIAGNOSIS — E119 Type 2 diabetes mellitus without complications: Secondary | ICD-10-CM | POA: Insufficient documentation

## 2013-06-02 DIAGNOSIS — E1169 Type 2 diabetes mellitus with other specified complication: Secondary | ICD-10-CM | POA: Insufficient documentation

## 2013-06-02 DIAGNOSIS — D649 Anemia, unspecified: Secondary | ICD-10-CM | POA: Diagnosis not present

## 2013-06-02 DIAGNOSIS — E118 Type 2 diabetes mellitus with unspecified complications: Secondary | ICD-10-CM | POA: Insufficient documentation

## 2013-06-02 LAB — COMPLETE METABOLIC PANEL WITH GFR
ALT: 10 U/L (ref 0–35)
AST: 11 U/L (ref 0–37)
Creat: 0.73 mg/dL (ref 0.50–1.10)
GFR, Est African American: >89 mL/min
Total Bilirubin: 0.3 mg/dL (ref 0.3–1.2)

## 2013-06-02 LAB — POCT CBC
HCT, POC: 37.7 % (ref 37.7–47.9)
Hemoglobin: 13.3 g/dL (ref 12.2–16.2)
MCH, POC: 29.9 pg (ref 27–31.2)
MCHC: 35.2 g/dL (ref 31.8–35.4)
POC Granulocyte: 6.2 (ref 2–6.9)

## 2013-06-02 LAB — POCT UA - MICROALBUMIN: Microalbumin Ur, POC: 20 mg/L

## 2013-06-02 LAB — POCT GLYCOSYLATED HEMOGLOBIN (HGB A1C): Hemoglobin A1C: 5.8

## 2013-06-02 MED ORDER — POLYSACCHARIDE IRON COMPLEX 150 MG PO CAPS: 150.0000 mg | ORAL_CAPSULE | Freq: Every day | ORAL | Status: DC

## 2013-06-02 MED ORDER — SIMVASTATIN 20 MG PO TABS: 20.0000 mg | ORAL_TABLET | Freq: Every day | ORAL | Status: DC

## 2013-06-02 MED ORDER — LISINOPRIL 5 MG PO TABS: 5.0000 mg | ORAL_TABLET | Freq: Every day | ORAL | Status: DC

## 2013-06-02 MED ORDER — METFORMIN HCL 500 MG PO TABS: 500.0000 mg | ORAL_TABLET | Freq: Two times a day (BID) | ORAL | Status: DC

## 2013-06-02 NOTE — Addendum Note (Signed)
Addended by: Tommas Olp on: 06/02/2013 10:31 AM   Modules accepted: Orders

## 2013-06-02 NOTE — Progress Notes (Signed)
Subjective:    Patient ID: Lindsay Villarreal, female    DOB: 1946-01-24, 67 y.o.   MRN: [id number]  Hyperlipidemia This is a chronic problem. The problem is controlled. Recent lipid tests were reviewed and are high. Exacerbating diseases include diabetes. There are no known factors aggravating her hyperlipidemia. Pertinent negatives include no focal sensory loss, focal weakness, leg pain or shortness of breath. Current antihyperlipidemic treatment includes statins. The current treatment provides moderate improvement of lipids. There are no compliance problems.  Risk factors for coronary artery disease include diabetes mellitus, post-menopausal and hypertension.  Diabetes She presents for her follow-up diabetic visit. She has type 2 diabetes mellitus. No MedicAlert identification noted. The initial diagnosis of diabetes was made 8 months ago. Her disease course has been fluctuating. There are no hypoglycemic associated symptoms. Pertinent negatives for hypoglycemia include no headaches or sweats. There are no diabetic associated symptoms. Pertinent negatives for diabetes include no blurred vision. There are no hypoglycemic complications. Symptoms are improving. There are no diabetic complications. Risk factors for coronary artery disease include dyslipidemia, hypertension and post-menopausal. Current diabetic treatment includes oral agent (monotherapy). She is compliant with treatment most of the time. Her weight is stable. She is following a diabetic diet. When asked about meal planning, she reported none. She has not had a previous visit with a dietician. She participates in exercise every other day. Her home blood glucose trend is decreasing rapidly. Her breakfast blood glucose is taken between 8-9 am. Her breakfast blood glucose range is generally 90-110 mg/dl. Her highest blood glucose is 110-130 mg/dl. Her overall blood glucose range is 90-110 mg/dl. An ACE inhibitor/angiotensin II receptor blocker is being  taken. She does not see a podiatrist.Eye exam is current (6 months ago).  Hypertension This is a chronic problem. The current episode started more than 1 year ago. The problem has been resolved since onset. The problem is controlled. Pertinent negatives include no anxiety, blurred vision, headaches, neck pain, orthopnea, palpitations, peripheral edema, shortness of breath or sweats. There are no associated agents to hypertension. Risk factors for coronary artery disease include diabetes mellitus, post-menopausal state and dyslipidemia. Past treatments include ACE inhibitors. The current treatment provides significant improvement. There are no compliance problems.   anemia Currently on nuiron- was released by hematologist- just said for [place] to keep watch on l;abs.   Review of Systems  HENT: Negative for neck pain.   Eyes: Negative for blurred vision.  Respiratory: Negative for shortness of breath.   Cardiovascular: Negative for palpitations and orthopnea.  Neurological: Negative for focal weakness and headaches.  All other systems reviewed and are negative.       Objective:   Physical Exam  Constitutional: She is oriented to person, place, and time. She appears well-developed and well-nourished.  HENT:  Nose: Nose normal.  Mouth/Throat: Oropharynx is clear and moist.  Eyes: EOM are normal.  Neck: Trachea normal, normal range of motion and full passive range of motion without pain. Neck supple. No JVD present. Carotid bruit is not present. No thyromegaly present.  Cardiovascular: Normal rate, regular rhythm, normal heart sounds and intact distal pulses.  Exam reveals no gallop and no friction rub.   No murmur heard. Pulmonary/Chest: Effort normal and breath sounds normal.  Abdominal: Soft. Bowel sounds are normal. She exhibits no distension and no mass. There is no tenderness.  Musculoskeletal: Normal range of motion.  Lymphadenopathy:    She has no cervical adenopathy.  Neurological:  She is alert and oriented to  person, place, and time. She has normal reflexes.  Skin: Skin is warm and dry.  Psychiatric: She has a normal mood and affect. Her behavior is normal. Judgment and thought content normal.    BP 131/80  Pulse 65  Temp(Src) 97 F (36.1 C) (Oral)  Ht 5\' 4"  (1.626 m)  Wt 152 lb (68.947 kg)  BMI 26.08 kg/m2 Results for orders placed in visit on 06/02/13  POCT GLYCOSYLATED HEMOGLOBIN (HGB A1C)      Result Value Range   Hemoglobin A1C 5.8%           Assessment & Plan:  1. Other and unspecified hyperlipidemia Low fat diet and exercise - NMR Lipoprofile with Lipids - simvastatin (ZOCOR) 20 MG tablet; Take 1 tablet (20 mg total) by mouth daily.  Dispense: 30 tablet; Refill: 5  2. Type II or unspecified type diabetes mellitus without mention of complication, not stated as uncontrolled Count carbs - POCT glycosylated hemoglobin (Hb A1C) - metFORMIN (GLUCOPHAGE) 500 MG tablet; Take 1 tablet (500 mg total) by mouth 2 (two) times daily.  Dispense: 60 tablet; Refill: 5  3. Hypertension Low NA+ diet - COMPLETE METABOLIC PANEL WITH GFR - lisinopril (PRINIVIL,ZESTRIL) 5 MG tablet; Take 1 tablet (5 mg total) by mouth daily.  Dispense: 30 tablet; Refill: 5  4. Anemia, unspecified Continue iron tablets - iron polysaccharides (NU-IRON) 150 MG capsule; Take 1 capsule (150 mg total) by mouth daily.  Dispense: 30 capsule; Refill: 11  Mary-Margaret Daphine Deutscher, FNP

## 2013-06-02 NOTE — Patient Instructions (Signed)
Diets for Diabetes, Food Labeling Look at food labels to help you decide how much of a product you can eat. You will want to check the amount of total carbohydrate in a serving to see how the food fits into your meal plan. In the list of ingredients, the ingredient present in the largest amount by weight must be listed first, followed by the other ingredients in descending order. STANDARD OF IDENTITY Most products have a list of ingredients. However, foods that the Food and Drug Administration (FDA) has given a standard of identity do not need a list of ingredients. A standard of identity means that a food must contain certain ingredients if it is called a particular name. Examples are mayonnaise, peanut butter, ketchup, jelly, and cheese. LABELING TERMS There are many terms found on food labels. Some of these terms have specific definitions. Some terms are regulated by the FDA, and the FDA has clearly specified how they can be used. Others are not regulated or well-defined and can be misleading and confusing. SPECIFICALLY DEFINED TERMS Nutritive Sweetener.  A sweetener that contains calories,such as table sugar or honey. Nonnutritive Sweetener.  A sweetener with few or no calories,such as saccharin, aspartame, sucralose, and cyclamate. LABELING TERMS REGULATED BY THE FDA Free.  The product contains only a tiny or small amount of fat, cholesterol, sodium, sugar, or calories. For example, a "fat-free" product will contain less than 0.5 g of fat per serving. Low.  A food described as "low" in fat, saturated fat, cholesterol, sodium, or calories could be eaten fairly often without exceeding dietary guidelines. For example, "low in fat" means no more than 3 g of fat per serving. Lean.  "Lean" and "extra lean" are U.S. Department of Agriculture (USDA) terms for use on meat and poultry products. "Lean" means the product contains less than 10 g of fat, 4 g of saturated fat, and 95 mg of cholesterol  per serving. "Lean" is not as low in fat as a product labeled "low." Extra Lean.  "Extra lean" means the product contains less than 5 g of fat, 2 g of saturated fat, and 95 mg of cholesterol per serving. While "extra lean" has less fat than "lean," it is still higher in fat than a product labeled "low." Reduced, Less, Fewer.  A diet product that contains 25% less of a nutrient or calories than the regular version. For example, hot dogs might be labeled "25% less fat than our regular hot dogs." Light/Lite.  A diet product that contains  fewer calories or  the fat of the original. For example, "light in sodium" means a product with  the usual sodium. More.  One serving contains at least 10% more of the daily value of a vitamin, mineral, or fiber than usual. Good Source Of.  One serving contains 10% to 19% of the daily value for a particular vitamin, mineral, or fiber. Excellent Source Of.  One serving contains 20% or more of the daily value for a particular nutrient. Other terms used might be "high in" or "rich in." Enriched or Fortified.  The product contains added vitamins, minerals, or protein. Nutrition labeling must be used on enriched or fortified foods. Imitation.  The product has been altered so that it is lower in protein, vitamins, or minerals than the usual food,such as imitation peanut butter. Total Fat.  The number listed is the total of all fat found in a serving of the product. Under total fat, food labels must list saturated fat and   trans fat, which are associated with raising bad cholesterol and an increased risk of heart blood vessel disease. Saturated Fat.  Mainly fats from animal-based sources. Some examples are red meat, cheese, cream, whole milk, and coconut oil. Trans Fat.  Found in some fried snack foods, packaged foods, and fried restaurant foods. It is recommended you eat as close to 0 g of trans fat as possible, since it raises bad cholesterol and lowers  good cholesterol. Polyunsaturated and Monounsaturated Fats.  More healthful fats. These fats are from plant sources. Total Carbohydrate.  The number of carbohydrate grams in a serving of the product. Under total carbohydrate are listed the other carbohydrate sources, such as dietary fiber and sugars. Dietary Fiber.  A carbohydrate from plant sources. Sugars.  Sugars listed on the label contain all naturally occurring sugars as well as added sugars. LABELING TERMS NOT REGULATED BY THE FDA Sugarless.  Table sugar (sucrose) has not been added. However, the manufacturer may use another form of sugar in place of sucrose to sweeten the product. For example, sugar alcohols are used to sweeten foods. Sugar alcohols are a form of sugar but are not table sugar. If a product contains sugar alcohols in place of sucrose, it can still be labeled "sugarless." Low Salt, Salt-Free, Unsalted, No Salt, No Salt Added, Without Added Salt.  Food that is usually processed with salt has been made without salt. However, the food may contain sodium-containing additives, such as preservatives, leavening agents, or flavorings. Natural.  This term has no legal meaning. Organic.  Foods that are certified as organic have been inspected and approved by the USDA to ensure they are produced without pesticides, fertilizers containing synthetic ingredients, bioengineering, or ionizing radiation. Document Released: 11/06/2003 Document Revised: 01/26/2012 Document Reviewed: 05/24/2009 [employer] Patient Information 2014 [employer].  

## 2013-06-03 LAB — NMR LIPOPROFILE WITH LIPIDS
Cholesterol, Total: 134 mg/dL (ref <200)
HDL Particle Number: 28.3 umol/L — ABNORMAL LOW (ref >=30.5)
LDL (calc): 74 mg/dL (ref <100)
LDL Particle Number: 1190 nmol/L — ABNORMAL HIGH (ref <1000)
LDL Size: 20 nm — ABNORMAL LOW (ref >20.5)
LP-IR Score: 71 — ABNORMAL HIGH (ref <=45)
Large VLDL-P: 3.2 nmol/L — ABNORMAL HIGH (ref <=2.7)
Small LDL Particle Number: 827 nmol/L — ABNORMAL HIGH (ref <=527)
VLDL Size: 47.7 nm — ABNORMAL HIGH (ref <=46.6)

## 2013-06-03 LAB — MICROALBUMIN, URINE: Microalb, Ur: 2.08 mg/dL — ABNORMAL HIGH (ref 0.00–1.89)

## 2013-07-06 ENCOUNTER — Telehealth: Payer: Self-pay | Admitting: Pharmacist

## 2013-07-06 NOTE — Telephone Encounter (Signed)
prolia injection due appt made for 07/13/13. Patient aware

## 2013-07-07 NOTE — Telephone Encounter (Signed)
Prolia ordered. I will let you know when it arrives.

## 2013-07-08 NOTE — Telephone Encounter (Signed)
Prolia in lab fridge

## 2013-07-11 DIAGNOSIS — E119 Type 2 diabetes mellitus without complications: Secondary | ICD-10-CM | POA: Diagnosis not present

## 2013-07-11 DIAGNOSIS — Z961 Presence of intraocular lens: Secondary | ICD-10-CM | POA: Diagnosis not present

## 2013-07-11 DIAGNOSIS — H5200 Hypermetropia, unspecified eye: Secondary | ICD-10-CM | POA: Diagnosis not present

## 2013-07-11 DIAGNOSIS — H5210 Myopia, unspecified eye: Secondary | ICD-10-CM | POA: Diagnosis not present

## 2013-07-11 LAB — HM DIABETES EYE EXAM

## 2013-07-13 ENCOUNTER — Ambulatory Visit (INDEPENDENT_AMBULATORY_CARE_PROVIDER_SITE_OTHER): Payer: Medicare Other | Admitting: Pharmacist

## 2013-07-13 DIAGNOSIS — M810 Age-related osteoporosis without current pathological fracture: Secondary | ICD-10-CM | POA: Diagnosis not present

## 2013-07-13 MED ORDER — DENOSUMAB 60 MG/ML ~~LOC~~ SOLN
60.0000 mg | Freq: Once | SUBCUTANEOUS | Status: AC
  Administered 2013-07-13: 60 mg via SUBCUTANEOUS

## 2013-07-13 NOTE — Progress Notes (Signed)
Patient ID: Lindsay Villarreal, female   DOB: [birth year], 67 y.o.   MRN: [id number] [facility]   HPI: Patient with osteoporosis here today for Prolia injection . She has had 1 Prolia injection previously with no adverse effects noted.                                                               Calcium Assessment Calcium Intake  # of servings/day  Calcium mg  Milk (8 oz) 0  x  300  = 0  Yogurt (4 oz) 1 x  200 = 200  Cheese (1 oz) 0 x  200 = 0  Other Calcium sources   250mg   Ca supplement 0 = 0   Estimated calcium intake per day 450mg      Assessment: osteoporosis  Recommendations: 1. Prolia 60mg  given SQ in left arm. 2.  recommend calcium 1200mg  daily through supplementation or diet.  3.  recommend weight bearing exercise - 30 minutes at least 4 days  per week.   4.  Counseled and educated about fall risk and prevention.  Recheck DEXA:  1 year  Time spent counseling patient:  20 minutes

## 2013-08-31 ENCOUNTER — Other Ambulatory Visit: Payer: Self-pay | Admitting: Family Medicine

## 2013-09-09 ENCOUNTER — Encounter: Payer: Self-pay | Admitting: Nurse Practitioner

## 2013-09-09 ENCOUNTER — Ambulatory Visit (INDEPENDENT_AMBULATORY_CARE_PROVIDER_SITE_OTHER): Payer: Medicare Other | Admitting: Nurse Practitioner

## 2013-09-09 VITALS — BP 133/76 | HR 68 | Temp 97.4°F | Ht 64.0 in | Wt 150.0 lb

## 2013-09-09 DIAGNOSIS — E538 Deficiency of other specified B group vitamins: Secondary | ICD-10-CM | POA: Diagnosis not present

## 2013-09-09 DIAGNOSIS — I10 Essential (primary) hypertension: Secondary | ICD-10-CM

## 2013-09-09 DIAGNOSIS — Z23 Encounter for immunization: Secondary | ICD-10-CM

## 2013-09-09 DIAGNOSIS — M810 Age-related osteoporosis without current pathological fracture: Secondary | ICD-10-CM | POA: Diagnosis not present

## 2013-09-09 DIAGNOSIS — E785 Hyperlipidemia, unspecified: Secondary | ICD-10-CM

## 2013-09-09 DIAGNOSIS — E119 Type 2 diabetes mellitus without complications: Secondary | ICD-10-CM | POA: Diagnosis not present

## 2013-09-09 DIAGNOSIS — D649 Anemia, unspecified: Secondary | ICD-10-CM | POA: Diagnosis not present

## 2013-09-09 LAB — POCT GLYCOSYLATED HEMOGLOBIN (HGB A1C): Hemoglobin A1C: 6

## 2013-09-09 NOTE — Progress Notes (Signed)
Subjective:    Patient ID: Lindsay Villarreal, female    DOB: [birth year], 67 y.o.   MRN: [id number]  Hyperlipidemia This is a chronic problem. The problem is controlled. Recent lipid tests were reviewed and are normal. Exacerbating diseases include diabetes. Factors aggravating her hyperlipidemia include smoking. Pertinent negatives include no focal sensory loss, focal weakness, leg pain or shortness of breath. Current antihyperlipidemic treatment includes statins. The current treatment provides moderate improvement of lipids. There are no compliance problems.  Risk factors for coronary artery disease include diabetes mellitus, post-menopausal and hypertension.  Diabetes She presents for her follow-up diabetic visit. She has type 2 diabetes mellitus. No MedicAlert identification noted. The initial diagnosis of diabetes was made 8 months ago. Her disease course has been fluctuating. There are no hypoglycemic associated symptoms. Pertinent negatives for hypoglycemia include no headaches or sweats. There are no diabetic associated symptoms. Pertinent negatives for diabetes include no blurred vision. There are no hypoglycemic complications. Symptoms are improving. There are no diabetic complications. Risk factors for coronary artery disease include dyslipidemia, hypertension and post-menopausal. Current diabetic treatment includes oral agent (monotherapy). She is compliant with treatment all of the time. Her weight is stable. She is following a diabetic diet. When asked about meal planning, she reported none. She has not had a previous visit with a dietician. She participates in exercise every other day. Her home blood glucose trend is fluctuating minimally. Her breakfast blood glucose is taken between 8-9 am. Her breakfast blood glucose range is generally 90-110 mg/dl. Her highest blood glucose is 110-130 mg/dl. Her overall blood glucose range is 90-110 mg/dl. An ACE inhibitor/angiotensin II receptor blocker is being  taken. She does not see a podiatrist.Eye exam is current (6 months ago).  Hypertension This is a chronic problem. The current episode started more than 1 year ago. The problem has been resolved since onset. The problem is controlled. Pertinent negatives include no anxiety, blurred vision, headaches, neck pain, orthopnea, palpitations, peripheral edema, shortness of breath or sweats. There are no associated agents to hypertension. Risk factors for coronary artery disease include diabetes mellitus, post-menopausal state, dyslipidemia and smoking/tobacco exposure. Past treatments include ACE inhibitors. The current treatment provides significant improvement. There are no compliance problems.  There is no history of a thyroid problem. There is no history of sleep apnea.  Anemia Currently on nuiron- was released by hematologist- just said for [place] to keep watch on labs. Pt denies fatigue. Occasionally constipation.    Review of Systems  Eyes: Negative for blurred vision.  Respiratory: Negative for shortness of breath.   Cardiovascular: Negative for palpitations and orthopnea.  Musculoskeletal: Negative for neck pain.  Neurological: Negative for focal weakness and headaches.  All other systems reviewed and are negative.       Objective:   Physical Exam  Vitals reviewed. Constitutional: She is oriented to person, place, and time. She appears well-developed and well-nourished.  HENT:  Nose: Nose normal.  Mouth/Throat: Oropharynx is clear and moist.  Eyes: EOM are normal.  Neck: Trachea normal, normal range of motion and full passive range of motion without pain. Neck supple. No JVD present. Carotid bruit is not present. No thyromegaly present.  Cardiovascular: Normal rate, regular rhythm, normal heart sounds and intact distal pulses.  Exam reveals no gallop and no friction rub.   No murmur heard. Pulmonary/Chest: Effort normal and breath sounds normal.  Abdominal: Soft. Bowel sounds are normal.  She exhibits no distension and no mass. There is no tenderness.  Musculoskeletal:  Normal range of motion.  Lymphadenopathy:    She has no cervical adenopathy.  Neurological: She is alert and oriented to person, place, and time. She has normal reflexes.  Skin: Skin is warm and dry.  Psychiatric: She has a normal mood and affect. Her behavior is normal. Judgment and thought content normal.    BP 133/76  Pulse 68  Temp(Src) 97.4 F (36.3 C) (Oral)  Ht 5\' 4"  (1.626 m)  Wt 150 lb (68.04 kg)  BMI 25.73 kg/m2 Results for orders placed in visit on 09/09/13  POCT GLYCOSYLATED HEMOGLOBIN (HGB A1C)      Result Value Range   Hemoglobin A1C 6.0           Assessment & Plan:   1. Osteoporosis   2. Hyperlipidemia   3. Anemia, unspecified   4. Type II or unspecified type diabetes mellitus without mention of complication, not stated as uncontrolled   5. Hypertension    Orders Placed This Encounter  Procedures  . CMP14+EGFR  . NMR, lipoprofile  . Anemia Profile B  . POCT glycosylated hemoglobin (Hb A1C)   No orders of the defined types were placed in this encounter.    Continue all meds Labs pending Diet and exercise encouraged Health maintenance reviewed Follow up in 3 months Flu shot today  Mary-Margaret Daphine Deutscher, FNP

## 2013-09-09 NOTE — Patient Instructions (Signed)

## 2013-09-10 LAB — ANEMIA PROFILE B
Basophils Absolute: 0 10*3/uL (ref 0.0–0.2)
Eosinophils Absolute: 0.1 10*3/uL (ref 0.0–0.4)
Ferritin: 68 ng/mL (ref 15–150)
Immature Granulocytes: 0 %
Iron Saturation: 19 % (ref 15–55)
Iron: 64 ug/dL (ref 35–155)
Lymphocytes Absolute: 2.4 10*3/uL (ref 0.7–3.1)
MCH: 28.3 pg (ref 26.6–33.0)
MCHC: 32.9 g/dL (ref 31.5–35.7)
MCV: 86 fL (ref 79–97)
Monocytes Absolute: 0.5 10*3/uL (ref 0.1–0.9)
Monocytes: 7 %
Neutrophils Absolute: 4.2 10*3/uL (ref 1.4–7.0)
Neutrophils Relative %: 58 %
Platelets: 294 10*3/uL (ref 150–379)
RBC: 4.49 x10E6/uL (ref 3.77–5.28)
RDW: 13.7 % (ref 12.3–15.4)
TIBC: 344 ug/dL (ref 250–450)
UIBC: 280 ug/dL (ref 150–375)
WBC: 7.2 10*3/uL (ref 3.4–10.8)

## 2013-09-10 LAB — CMP14+EGFR
ALT: 9 IU/L (ref 0–32)
AST: 10 IU/L (ref 0–40)
Albumin: 4.1 g/dL (ref 3.6–4.8)
BUN: 17 mg/dL (ref 8–27)
CO2: 26 mmol/L (ref 18–29)
Calcium: 9 mg/dL (ref 8.6–10.2)
Chloride: 102 mmol/L (ref 97–108)
Creatinine, Ser: 0.67 mg/dL (ref 0.57–1.00)
GFR calc Af Amer: 105 mL/min/{1.73_m2} (ref >59)
Globulin, Total: 1.9 g/dL (ref 1.5–4.5)
Potassium: 4.3 mmol/L (ref 3.5–5.2)
Sodium: 142 mmol/L (ref 134–144)
Total Bilirubin: 0.3 mg/dL (ref 0.0–1.2)

## 2013-09-10 LAB — NMR, LIPOPROFILE
HDL Cholesterol by NMR: 37 mg/dL — ABNORMAL LOW (ref >=40)
HDL Particle Number: 32 umol/L (ref >=30.5)
LDL Particle Number: 891 nmol/L (ref <1000)
LDLC SERPL CALC-MCNC: 43 mg/dL (ref <100)

## 2013-09-15 ENCOUNTER — Other Ambulatory Visit (INDEPENDENT_AMBULATORY_CARE_PROVIDER_SITE_OTHER): Payer: Self-pay

## 2013-09-15 DIAGNOSIS — Z1212 Encounter for screening for malignant neoplasm of rectum: Secondary | ICD-10-CM

## 2013-12-21 ENCOUNTER — Telehealth: Payer: Self-pay | Admitting: Nurse Practitioner

## 2013-12-21 NOTE — Telephone Encounter (Signed)
prolia not due until 01/13/14.  We are waiting on insurance verification before scheduling appt.  Will call patient when insurance info known.

## 2013-12-29 ENCOUNTER — Other Ambulatory Visit: Payer: Self-pay | Admitting: Oncology

## 2014-01-11 ENCOUNTER — Ambulatory Visit (INDEPENDENT_AMBULATORY_CARE_PROVIDER_SITE_OTHER): Payer: Medicare Other | Admitting: Nurse Practitioner

## 2014-01-11 ENCOUNTER — Encounter: Payer: Self-pay | Admitting: Nurse Practitioner

## 2014-01-11 VITALS — BP 135/76 | HR 76 | Temp 97.0°F | Ht 64.0 in | Wt 152.0 lb

## 2014-01-11 DIAGNOSIS — I10 Essential (primary) hypertension: Secondary | ICD-10-CM | POA: Diagnosis not present

## 2014-01-11 DIAGNOSIS — D649 Anemia, unspecified: Secondary | ICD-10-CM | POA: Diagnosis not present

## 2014-01-11 DIAGNOSIS — E531 Pyridoxine deficiency: Secondary | ICD-10-CM | POA: Diagnosis not present

## 2014-01-11 DIAGNOSIS — E538 Deficiency of other specified B group vitamins: Secondary | ICD-10-CM | POA: Diagnosis not present

## 2014-01-11 DIAGNOSIS — E119 Type 2 diabetes mellitus without complications: Secondary | ICD-10-CM | POA: Diagnosis not present

## 2014-01-11 DIAGNOSIS — E785 Hyperlipidemia, unspecified: Secondary | ICD-10-CM

## 2014-01-11 DIAGNOSIS — Z79899 Other long term (current) drug therapy: Secondary | ICD-10-CM | POA: Diagnosis not present

## 2014-01-11 LAB — POCT GLYCOSYLATED HEMOGLOBIN (HGB A1C)

## 2014-01-11 MED ORDER — LISINOPRIL 5 MG PO TABS: 5.0000 mg | ORAL_TABLET | Freq: Every day | ORAL | Status: DC

## 2014-01-11 MED ORDER — SIMVASTATIN 20 MG PO TABS: 20.0000 mg | ORAL_TABLET | Freq: Every day | ORAL | Status: DC

## 2014-01-11 MED ORDER — METFORMIN HCL 500 MG PO TABS: 500.0000 mg | ORAL_TABLET | Freq: Two times a day (BID) | ORAL | Status: DC

## 2014-01-11 MED ORDER — POLYSACCHARIDE IRON COMPLEX 150 MG PO CAPS: 150.0000 mg | ORAL_CAPSULE | Freq: Every day | ORAL | Status: DC

## 2014-01-11 NOTE — Patient Instructions (Signed)

## 2014-01-11 NOTE — Progress Notes (Signed)
Subjective:    Patient ID: Lindsay Villarreal, female    DOB: 1946-11-11, 68 y.o.   MRN: [id number]  Patient here today for follow up of chronic medical problems- no complaints- no changes  Hyperlipidemia This is a chronic problem. The problem is controlled. Recent lipid tests were reviewed and are normal. Exacerbating diseases include diabetes. Factors aggravating her hyperlipidemia include smoking. Pertinent negatives include no focal sensory loss, focal weakness, leg pain or shortness of breath. Current antihyperlipidemic treatment includes statins. The current treatment provides moderate improvement of lipids. There are no compliance problems.  Risk factors for coronary artery disease include diabetes mellitus, post-menopausal and hypertension.  Diabetes She presents for her follow-up diabetic visit. She has type 2 diabetes mellitus. No MedicAlert identification noted. The initial diagnosis of diabetes was made 8 months ago. Her disease course has been fluctuating. There are no hypoglycemic associated symptoms. Pertinent negatives for hypoglycemia include no headaches or sweats. There are no diabetic associated symptoms. Pertinent negatives for diabetes include no blurred vision. There are no hypoglycemic complications. Symptoms are improving. There are no diabetic complications. Risk factors for coronary artery disease include dyslipidemia, hypertension and post-menopausal. Current diabetic treatment includes oral agent (monotherapy). She is compliant with treatment all of the time. Her weight is stable. She is following a diabetic diet. When asked about meal planning, she reported none. She has not had a previous visit with a dietician. She participates in exercise every other day. Her home blood glucose trend is fluctuating minimally. Her breakfast blood glucose is taken between 8-9 am. Her breakfast blood glucose range is generally 90-110 mg/dl. Her highest blood glucose is 110-130 mg/dl. Her overall  blood glucose range is 90-110 mg/dl. An ACE inhibitor/angiotensin II receptor blocker is being taken. She does not see a podiatrist.Eye exam is current (6 months ago).  Hypertension This is a chronic problem. The current episode started more than 1 year ago. The problem has been resolved since onset. The problem is controlled. Pertinent negatives include no anxiety, blurred vision, headaches, neck pain, orthopnea, palpitations, peripheral edema, shortness of breath or sweats. There are no associated agents to hypertension. Risk factors for coronary artery disease include diabetes mellitus, post-menopausal state, dyslipidemia and smoking/tobacco exposure. Past treatments include ACE inhibitors. The current treatment provides significant improvement. There are no compliance problems.  There is no history of a thyroid problem. There is no history of sleep apnea.  Anemia Currently on nuiron- was released by hematologist- just said for [place] to keep watch on labs. Pt denies fatigue. Occasionally constipation.    Review of Systems  Eyes: Negative for blurred vision.  Respiratory: Negative for shortness of breath.   Cardiovascular: Negative for palpitations and orthopnea.  Musculoskeletal: Negative for neck pain.  Neurological: Negative for focal weakness and headaches.  All other systems reviewed and are negative.       Objective:   Physical Exam  Vitals reviewed. Constitutional: She is oriented to person, place, and time. She appears well-developed and well-nourished.  HENT:  Nose: Nose normal.  Mouth/Throat: Oropharynx is clear and moist.  Eyes: EOM are normal.  Neck: Trachea normal, normal range of motion and full passive range of motion without pain. Neck supple. No JVD present. Carotid bruit is not present. No thyromegaly present.  Cardiovascular: Normal rate, regular rhythm and intact distal pulses.  Exam reveals no gallop and no friction rub.   Murmur (2/6 systolic heard loudest at pulmonic  valve) heard. Pulmonary/Chest: Effort normal and breath sounds normal.  Abdominal:  Soft. Bowel sounds are normal. She exhibits no distension and no mass. There is no tenderness.  Musculoskeletal: Normal range of motion.  Lymphadenopathy:    She has no cervical adenopathy.  Neurological: She is alert and oriented to person, place, and time. She has normal reflexes.  Skin: Skin is warm and dry.  Psychiatric: She has a normal mood and affect. Her behavior is normal. Judgment and thought content normal.   BP 135/76  Pulse 76  Temp(Src) 97 F (36.1 C) (Oral)  Ht '5\' 4"'  (1.626 m)  Wt 152 lb (68.947 kg)  BMI 26.08 kg/m2 Results for orders placed in visit on 01/11/14  POCT GLYCOSYLATED HEMOGLOBIN (HGB A1C)      Result Value Ref Range   Hemoglobin A1C 6.3%           Assessment & Plan:    1. Hyperlipidemia   2. Type II or unspecified type diabetes mellitus without mention of complication, not stated as uncontrolled   3. Hypertension   4. Anemia   5. Anemia, unspecified   6. Other and unspecified hyperlipidemia    Orders Placed This Encounter  Procedures  . CMP14+EGFR  . NMR, lipoprofile  . Anemia Profile B  . POCT glycosylated hemoglobin (Hb A1C)   Meds ordered this encounter  Medications  . aspirin 81 MG tablet    Sig: Take 81 mg by mouth daily.  . iron polysaccharides (NU-IRON) 150 MG capsule    Sig: Take 1 capsule (150 mg total) by mouth daily.    Dispense:  30 capsule    Refill:  11    Order Specific Question:  Supervising Provider    Answer:  Chipper Herb [[id number]]  . lisinopril (PRINIVIL,ZESTRIL) 5 MG tablet    Sig: Take 1 tablet (5 mg total) by mouth daily.    Dispense:  30 tablet    Refill:  5    Order Specific Question:  Supervising Provider    Answer:  Chipper Herb [[id number]]  . metFORMIN (GLUCOPHAGE) 500 MG tablet    Sig: Take 1 tablet (500 mg total) by mouth 2 (two) times daily.    Dispense:  60 tablet    Refill:  5    Order Specific Question:   Supervising Provider    Answer:  Chipper Herb [[id number]]  . simvastatin (ZOCOR) 20 MG tablet    Sig: Take 1 tablet (20 mg total) by mouth daily.    Dispense:  30 tablet    Refill:  5    Order Specific Question:  Supervising Provider    Answer:  Chipper Herb [[id number]]    Labs pending Health maintenance reviewed Diet and exercise encouraged Continue all meds Follow up  In 3 months   [place], FNP

## 2014-01-13 LAB — ANEMIA PROFILE B
BASOS ABS: 0 10*3/uL (ref 0.0–0.2)
Basos: 0 %
EOS: 1 %
Eosinophils Absolute: 0.1 10*3/uL (ref 0.0–0.4)
FERRITIN: 73 ng/mL (ref 15–150)
Folate: 15.8 ng/mL (ref >3.0)
HEMATOCRIT: 40.3 % (ref 34.0–46.6)
Hemoglobin: 13.5 g/dL (ref 11.1–15.9)
IMMATURE GRANULOCYTES: 0 %
Immature Grans (Abs): 0 10*3/uL (ref 0.0–0.1)
Iron Saturation: 20 % (ref 15–55)
Iron: 74 ug/dL (ref 35–155)
LYMPHS ABS: 2.9 10*3/uL (ref 0.7–3.1)
Lymphs: 35 %
MCH: 28.8 pg (ref 26.6–33.0)
MCHC: 33.5 g/dL (ref 31.5–35.7)
MCV: 86 fL (ref 79–97)
MONOCYTES: 7 %
Monocytes Absolute: 0.5 10*3/uL (ref 0.1–0.9)
Neutrophils Absolute: 4.7 10*3/uL (ref 1.4–7.0)
Neutrophils Relative %: 57 %
PLATELETS: 332 10*3/uL (ref 150–379)
RBC: 4.68 x10E6/uL (ref 3.77–5.28)
RDW: 13.6 % (ref 12.3–15.4)
RETIC CT PCT: 0.8 % (ref 0.6–2.6)
TIBC: 366 ug/dL (ref 250–450)
UIBC: 292 ug/dL (ref 150–375)
Vitamin B-12: 172 pg/mL — ABNORMAL LOW (ref 211–946)
WBC: 8.2 10*3/uL (ref 3.4–10.8)

## 2014-01-13 LAB — CMP14+EGFR
ALT: 11 IU/L (ref 0–32)
AST: 10 IU/L (ref 0–40)
Albumin/Globulin Ratio: 2.1 (ref 1.1–2.5)
Albumin: 4.5 g/dL (ref 3.6–4.8)
Alkaline Phosphatase: 66 IU/L (ref 39–117)
BILIRUBIN TOTAL: 0.3 mg/dL (ref 0.0–1.2)
BUN/Creatinine Ratio: 27 — ABNORMAL HIGH (ref 11–26)
BUN: 20 mg/dL (ref 8–27)
CALCIUM: 10.4 mg/dL — AB (ref 8.7–10.3)
CO2: 25 mmol/L (ref 18–29)
Chloride: 97 mmol/L (ref 97–108)
Creatinine, Ser: 0.73 mg/dL (ref 0.57–1.00)
GFR calc non Af Amer: 85 mL/min/{1.73_m2} (ref >59)
GFR, EST AFRICAN AMERICAN: 99 mL/min/{1.73_m2} (ref >59)
GLUCOSE: 102 mg/dL — AB (ref 65–99)
Globulin, Total: 2.1 g/dL (ref 1.5–4.5)
POTASSIUM: 4.6 mmol/L (ref 3.5–5.2)
Sodium: 139 mmol/L (ref 134–144)
Total Protein: 6.6 g/dL (ref 6.0–8.5)

## 2014-01-13 LAB — NMR, LIPOPROFILE
Cholesterol: 122 mg/dL (ref <200)
HDL Cholesterol by NMR: 41 mg/dL (ref >=40)
HDL Particle Number: 32 umol/L (ref >=30.5)
LDL PARTICLE NUMBER: 921 nmol/L (ref <1000)
LDL SIZE: 20 nm — AB (ref >20.5)
LDLC SERPL CALC-MCNC: 42 mg/dL (ref <100)
LP-IR Score: 85 — ABNORMAL HIGH (ref <=45)
Small LDL Particle Number: 653 nmol/L — ABNORMAL HIGH (ref <=527)
Triglycerides by NMR: 193 mg/dL — ABNORMAL HIGH (ref <150)

## 2014-01-26 ENCOUNTER — Ambulatory Visit (INDEPENDENT_AMBULATORY_CARE_PROVIDER_SITE_OTHER): Payer: Medicare Other | Admitting: Pharmacist

## 2014-01-26 DIAGNOSIS — M810 Age-related osteoporosis without current pathological fracture: Secondary | ICD-10-CM | POA: Diagnosis not present

## 2014-01-26 DIAGNOSIS — E538 Deficiency of other specified B group vitamins: Secondary | ICD-10-CM

## 2014-01-26 MED ORDER — DENOSUMAB 60 MG/ML ~~LOC~~ SOLN
60.0000 mg | Freq: Once | SUBCUTANEOUS | Status: AC
  Administered 2014-01-26: 60 mg via SUBCUTANEOUS

## 2014-01-26 NOTE — Progress Notes (Signed)
Patient ID: Lindsay Villarreal, female   DOB: [birth year], 68 y.o.   MRN: [id number]  [facility]   HPI: Patient with osteoporosis here today for Prolia injection . She has had 2 Prolia injection previously with no adverse effects noted.   Last prolia injection 06/2013   Calcium Assessment Calcium Intake  # of servings/day  Calcium mg  Milk (8 oz) 0  x  300  = 0  Yogurt (4 oz) 1 x  200 = 200  Cheese (1 oz) 0 x  200 = 0  Other Calcium sources   250mg   Ca supplement 1000mg  = 1000mg    Estimated calcium intake per day 1450mg      Assessment: osteoporosis  Recommendations: 1. Prolia 60mg  given SQ in left arm. 2.  Continue calcium 1200mg  daily through supplementation or diet.  3.  recommend weight bearing exercise - 30 minutes at least 4 days  per week.   4.  Counseled and educated about fall risk and prevention.  Recheck DEXA:  1 year  Time spent counseling patient:  20 minutes  Cherre Robins, PharmD, CPP

## 2014-02-06 DIAGNOSIS — Z1231 Encounter for screening mammogram for malignant neoplasm of breast: Secondary | ICD-10-CM | POA: Diagnosis not present

## 2014-03-08 ENCOUNTER — Ambulatory Visit: Payer: Medicare Other

## 2014-03-08 ENCOUNTER — Other Ambulatory Visit (INDEPENDENT_AMBULATORY_CARE_PROVIDER_SITE_OTHER): Payer: Medicare Other

## 2014-03-08 DIAGNOSIS — E531 Pyridoxine deficiency: Secondary | ICD-10-CM | POA: Diagnosis not present

## 2014-03-08 DIAGNOSIS — E538 Deficiency of other specified B group vitamins: Secondary | ICD-10-CM | POA: Diagnosis not present

## 2014-03-08 NOTE — Progress Notes (Signed)
Pt came in for lab  only 

## 2014-03-09 LAB — ANEMIA PROFILE B
BASOS: 0 %
Basophils Absolute: 0 10*3/uL (ref 0.0–0.2)
EOS: 1 %
Eosinophils Absolute: 0.1 10*3/uL (ref 0.0–0.4)
Ferritin: 55 ng/mL (ref 15–150)
Folate: 11 ng/mL (ref >3.0)
HEMATOCRIT: 39.2 % (ref 34.0–46.6)
HEMOGLOBIN: 13.1 g/dL (ref 11.1–15.9)
Immature Grans (Abs): 0 10*3/uL (ref 0.0–0.1)
Immature Granulocytes: 0 %
Iron Saturation: 17 % (ref 15–55)
Iron: 56 ug/dL (ref 35–155)
LYMPHS ABS: 2.6 10*3/uL (ref 0.7–3.1)
Lymphs: 39 %
MCH: 28.7 pg (ref 26.6–33.0)
MCHC: 33.4 g/dL (ref 31.5–35.7)
MCV: 86 fL (ref 79–97)
MONOCYTES: 6 %
MONOS ABS: 0.4 10*3/uL (ref 0.1–0.9)
NEUTROS ABS: 3.6 10*3/uL (ref 1.4–7.0)
Neutrophils Relative %: 54 %
Platelets: 269 10*3/uL (ref 150–379)
RBC: 4.57 x10E6/uL (ref 3.77–5.28)
RDW: 13.5 % (ref 12.3–15.4)
Retic Ct Pct: 0.8 % (ref 0.6–2.6)
TIBC: 332 ug/dL (ref 250–450)
UIBC: 276 ug/dL (ref 150–375)
Vitamin B-12: 887 pg/mL (ref 211–946)
WBC: 6.6 10*3/uL (ref 3.4–10.8)

## 2014-03-15 ENCOUNTER — Encounter: Payer: Self-pay | Admitting: Pharmacist

## 2014-05-12 DIAGNOSIS — L820 Inflamed seborrheic keratosis: Secondary | ICD-10-CM | POA: Diagnosis not present

## 2014-05-12 DIAGNOSIS — L821 Other seborrheic keratosis: Secondary | ICD-10-CM | POA: Diagnosis not present

## 2014-05-12 DIAGNOSIS — D485 Neoplasm of uncertain behavior of skin: Secondary | ICD-10-CM | POA: Diagnosis not present

## 2014-05-12 DIAGNOSIS — D239 Other benign neoplasm of skin, unspecified: Secondary | ICD-10-CM | POA: Diagnosis not present

## 2014-05-12 DIAGNOSIS — L819 Disorder of pigmentation, unspecified: Secondary | ICD-10-CM | POA: Diagnosis not present

## 2014-05-15 DIAGNOSIS — L819 Disorder of pigmentation, unspecified: Secondary | ICD-10-CM | POA: Diagnosis not present

## 2014-07-17 ENCOUNTER — Other Ambulatory Visit: Payer: Self-pay | Admitting: Nurse Practitioner

## 2014-07-21 ENCOUNTER — Ambulatory Visit (INDEPENDENT_AMBULATORY_CARE_PROVIDER_SITE_OTHER): Payer: Medicare Other | Admitting: Nurse Practitioner

## 2014-07-21 ENCOUNTER — Encounter: Payer: Self-pay | Admitting: Nurse Practitioner

## 2014-07-21 VITALS — BP 153/75 | HR 64 | Temp 96.9°F | Ht 64.0 in | Wt 155.0 lb

## 2014-07-21 DIAGNOSIS — Z6826 Body mass index (BMI) 26.0-26.9, adult: Secondary | ICD-10-CM

## 2014-07-21 DIAGNOSIS — M810 Age-related osteoporosis without current pathological fracture: Secondary | ICD-10-CM

## 2014-07-21 DIAGNOSIS — E785 Hyperlipidemia, unspecified: Secondary | ICD-10-CM | POA: Diagnosis not present

## 2014-07-21 DIAGNOSIS — D649 Anemia, unspecified: Secondary | ICD-10-CM

## 2014-07-21 DIAGNOSIS — E1142 Type 2 diabetes mellitus with diabetic polyneuropathy: Secondary | ICD-10-CM

## 2014-07-21 DIAGNOSIS — Z713 Dietary counseling and surveillance: Secondary | ICD-10-CM

## 2014-07-21 DIAGNOSIS — E1149 Type 2 diabetes mellitus with other diabetic neurological complication: Secondary | ICD-10-CM

## 2014-07-21 DIAGNOSIS — I10 Essential (primary) hypertension: Secondary | ICD-10-CM | POA: Diagnosis not present

## 2014-07-21 LAB — POCT GLYCOSYLATED HEMOGLOBIN (HGB A1C): Hemoglobin A1C: 6.3

## 2014-07-21 LAB — POCT UA - MICROALBUMIN: Microalbumin Ur, POC: 20 mg/L

## 2014-07-21 MED ORDER — GLUCOSE BLOOD VI STRP: ORAL_STRIP | Status: DC

## 2014-07-21 MED ORDER — GABAPENTIN 100 MG PO CAPS: 100.0000 mg | ORAL_CAPSULE | Freq: Three times a day (TID) | ORAL | Status: DC

## 2014-07-21 MED ORDER — ONETOUCH DELICA LANCETS 33G MISC: Status: DC

## 2014-07-21 NOTE — Progress Notes (Signed)
Subjective:    Patient ID: Lindsay Villarreal, female    DOB: March 01, 1946, 68 y.o.   MRN: [id number]  Patient here today for follow up of chronic medical problems- Her only complaint today is burning of her feet- so bad sometimes that she cannot stand it.  Hyperlipidemia This is a chronic problem. The problem is controlled. Recent lipid tests were reviewed and are normal. Exacerbating diseases include diabetes. Factors aggravating her hyperlipidemia include smoking. Pertinent negatives include no focal sensory loss, focal weakness, leg pain or shortness of breath. Current antihyperlipidemic treatment includes statins. The current treatment provides moderate improvement of lipids. There are no compliance problems.  Risk factors for coronary artery disease include diabetes mellitus, post-menopausal and hypertension.  Diabetes She presents for her follow-up diabetic visit. She has type 2 diabetes mellitus. No MedicAlert identification noted. The initial diagnosis of diabetes was made 8 months ago. Her disease course has been fluctuating. There are no hypoglycemic associated symptoms. Pertinent negatives for hypoglycemia include no headaches or sweats. There are no diabetic associated symptoms. Pertinent negatives for diabetes include no blurred vision. There are no hypoglycemic complications. Symptoms are improving. There are no diabetic complications. Risk factors for coronary artery disease include dyslipidemia, hypertension and post-menopausal. Current diabetic treatment includes oral agent (monotherapy). She is compliant with treatment all of the time. Her weight is stable. She is following a diabetic diet. When asked about meal planning, she reported none. She has not had a previous visit with a dietician. She participates in exercise every other day. Her home blood glucose trend is fluctuating minimally. Her breakfast blood glucose is taken between 8-9 am. Her breakfast blood glucose range is generally 90-110  mg/dl. Her highest blood glucose is 110-130 mg/dl. Her overall blood glucose range is 90-110 mg/dl. An ACE inhibitor/angiotensin II receptor blocker is being taken. She does not see a podiatrist.Eye exam is current (6 months ago).  Hypertension This is a chronic problem. The current episode started more than 1 year ago. The problem has been resolved since onset. The problem is controlled. Pertinent negatives include no anxiety, blurred vision, headaches, neck pain, orthopnea, palpitations, peripheral edema, shortness of breath or sweats. There are no associated agents to hypertension. Risk factors for coronary artery disease include diabetes mellitus, post-menopausal state, dyslipidemia and smoking/tobacco exposure. Past treatments include ACE inhibitors. The current treatment provides significant improvement. There are no compliance problems.  There is no history of a thyroid problem. There is no history of sleep apnea.  Anemia Currently on nuiron- was released by hematologist- just said for [place] to keep watch on labs. Pt denies fatigue. Occasionally constipation.  Osteoporosis Prolia every 6 months- no side effects     Review of Systems  Eyes: Negative for blurred vision.  Respiratory: Negative for shortness of breath.   Cardiovascular: Negative for palpitations and orthopnea.  Musculoskeletal: Negative for neck pain.  Neurological: Negative for focal weakness and headaches.  All other systems reviewed and are negative.      Objective:   Physical Exam  Vitals reviewed. Constitutional: She is oriented to person, place, and time. She appears well-developed and well-nourished.  HENT:  Nose: Nose normal.  Mouth/Throat: Oropharynx is clear and moist.  Eyes: EOM are normal.  Neck: Trachea normal, normal range of motion and full passive range of motion without pain. Neck supple. No JVD present. Carotid bruit is not present. No thyromegaly present.  Cardiovascular: Normal rate, regular rhythm  and intact distal pulses.  Exam reveals no gallop and no  friction rub.   Murmur (2/6 systolic heard loudest at pulmonic valve) heard. Pulmonary/Chest: Effort normal and breath sounds normal.  Abdominal: Soft. Bowel sounds are normal. She exhibits no distension and no mass. There is no tenderness.  Musculoskeletal: Normal range of motion.  Lymphadenopathy:    She has no cervical adenopathy.  Neurological: She is alert and oriented to person, place, and time. She has normal reflexes.  Skin: Skin is warm and dry.  Psychiatric: She has a normal mood and affect. Her behavior is normal. Judgment and thought content normal.   BP 153/75  Pulse 64  Temp(Src) 96.9 F (36.1 C) (Oral)  Ht '5\' 4"'  (1.626 m)  Wt 155 lb (70.308 kg)  BMI 26.59 kg/m2        Assessment & Plan:    1. Hyperlipidemia   2. Essential hypertension   3. Type 2 diabetes mellitus with diabetic polyneuropathy   4. Osteoporosis   5. Anemia, unspecified   6. Diabetic polyneuropathy associated with type 2 diabetes mellitus   7. BMI 26.0-26.9,adult   8. Weight loss counseling, encounter for     Orders Placed This Encounter  Procedures  . CMP14+EGFR  . NMR, lipoprofile  . POCT glycosylated hemoglobin (Hb A1C)  . POCT UA - Microalbumin   Meds ordered this encounter  Medications  . cholecalciferol (VITAMIN D) 1000 UNITS tablet    Sig: Take 1,000 Units by mouth daily.  [name] thiamine (VITAMIN B-1) 100 MG tablet    Sig: Take 100 mg by mouth daily.  . calcium carbonate 200 MG capsule    Sig: Take 250 mg by mouth 2 (two) times daily with a meal.  . denosumab (PROLIA) 60 MG/ML SOLN injection    Sig: Inject 60 mg into the skin every 6 (six) months. Administer in upper arm, thigh, or abdomen  . ONETOUCH DELICA LANCETS 54D MISC    Sig: USE TO TEST BLOOD GLUCOSE EVERY DAY OR AS DIRECTED    Dispense:  100 each    Refill:  2    Dx 250.00    Order Specific Question:  Supervising Provider    Answer:  Chipper Herb [[id number]]  .  glucose blood (ONE TOUCH ULTRA TEST) test strip    Sig: USE ONE  EVERY DAY AS DIRECTED    Dispense:  100 each    Refill:  2    Dx 250.00    Order Specific Question:  Supervising Provider    Answer:  Chipper Herb [[id number]]  . gabapentin (NEURONTIN) 100 MG capsule    Sig: Take 1 capsule (100 mg total) by mouth 3 (three) times daily.    Dispense:  90 capsule    Refill:  3    Order Specific Question:  Supervising Provider    Answer:  Chipper Herb [[id number]]   Added gabapentin 110m TID Discussed weight management for patient with BMI> 25 Labs pending Health maintenance reviewed Diet and exercise encouraged Continue all meds Follow up  In 3 months   [place] FNP

## 2014-07-21 NOTE — Patient Instructions (Signed)
Diabetes and Foot Care Diabetes may cause you to have problems because of poor blood supply (circulation) to your feet and legs. This may cause the skin on your feet to become thinner, break easier, and heal more slowly. Your skin may become dry, and the skin may peel and crack. You may also have nerve damage in your legs and feet causing decreased feeling in them. You may not notice minor injuries to your feet that could lead to infections or more serious problems. Taking care of your feet is one of the most important things you can do for yourself.  HOME CARE INSTRUCTIONS  Wear shoes at all times, even in the house. Do not go barefoot. Bare feet are easily injured.  Check your feet daily for blisters, cuts, and redness. If you cannot see the bottom of your feet, use a mirror or ask someone for help.  Wash your feet with warm water (do not use hot water) and mild soap. Then pat your feet and the areas between your toes until they are completely dry. Do not soak your feet as this can dry your skin.  Apply a moisturizing lotion or petroleum jelly (that does not contain alcohol and is unscented) to the skin on your feet and to dry, brittle toenails. Do not apply lotion between your toes.  Trim your toenails straight across. Do not dig under them or around the cuticle. File the edges of your nails with an emery board or nail file.  Do not cut corns or calluses or try to remove them with medicine.  Wear clean socks or stockings every day. Make sure they are not too tight. Do not wear knee-high stockings since they may decrease blood flow to your legs.  Wear shoes that fit properly and have enough cushioning. To break in new shoes, wear them for just a few hours a day. This prevents you from injuring your feet. Always look in your shoes before you put them on to be sure there are no objects inside.  Do not cross your legs. This may decrease the blood flow to your feet.  If you find a minor scrape,  cut, or break in the skin on your feet, keep it and the skin around it clean and dry. These areas may be cleansed with mild soap and water. Do not cleanse the area with peroxide, alcohol, or iodine.  When you remove an adhesive bandage, be sure not to damage the skin around it.  If you have a wound, look at it several times a day to make sure it is healing.  Do not use heating pads or hot water bottles. They may burn your skin. If you have lost feeling in your feet or legs, you may not know it is happening until it is too late.  Make sure your health care provider performs a complete foot exam at least annually or more often if you have foot problems. Report any cuts, sores, or bruises to your health care provider immediately. SEEK MEDICAL CARE IF:   You have an injury that is not healing.  You have cuts or breaks in the skin.  You have an ingrown nail.  You notice redness on your legs or feet.  You feel burning or tingling in your legs or feet.  You have pain or cramps in your legs and feet.  Your legs or feet are numb.  Your feet always feel cold. SEEK IMMEDIATE MEDICAL CARE IF:   There is increasing redness,   swelling, or pain in or around a wound.  There is a red line that goes up your leg.  Pus is coming from a wound.  You develop a fever or as directed by your health care provider.  You notice a bad smell coming from an ulcer or wound. Document Released: 10/31/2000 Document Revised: 07/06/2013 Document Reviewed: 04/12/2013 Exit[employer] Patient Information 2015 [employer]. This information is not intended to replace advice given to you by your health care provider. Make sure you discuss any questions you have with your health care provider.  

## 2014-07-21 NOTE — Addendum Note (Signed)
Addended by: Earlene Plater on: 07/21/2014 10:17 AM   Modules accepted: Orders

## 2014-07-22 LAB — NMR, LIPOPROFILE
Cholesterol: 114 mg/dL (ref 100–199)
HDL CHOLESTEROL BY NMR: 35 mg/dL — AB (ref >39)
HDL PARTICLE NUMBER: 30.6 umol/L (ref >=30.5)
LDL Particle Number: 713 nmol/L (ref <1000)
LDL Size: 19.9 nm (ref >20.5)
LDLC SERPL CALC-MCNC: 46 mg/dL (ref 0–99)
LP-IR Score: 76 — ABNORMAL HIGH (ref <=45)
SMALL LDL PARTICLE NUMBER: 507 nmol/L (ref <=527)
Triglycerides by NMR: 165 mg/dL — ABNORMAL HIGH (ref 0–149)

## 2014-07-22 LAB — CMP14+EGFR
ALK PHOS: 59 IU/L (ref 39–117)
ALT: 12 IU/L (ref 0–32)
AST: 11 IU/L (ref 0–40)
Albumin/Globulin Ratio: 1.9 (ref 1.1–2.5)
Albumin: 4 g/dL (ref 3.6–4.8)
BUN / CREAT RATIO: 20 (ref 11–26)
BUN: 15 mg/dL (ref 8–27)
CALCIUM: 9.6 mg/dL (ref 8.7–10.3)
CO2: 26 mmol/L (ref 18–29)
CREATININE: 0.76 mg/dL (ref 0.57–1.00)
Chloride: 99 mmol/L (ref 97–108)
GFR, EST AFRICAN AMERICAN: 93 mL/min/{1.73_m2} (ref >59)
GFR, EST NON AFRICAN AMERICAN: 81 mL/min/{1.73_m2} (ref >59)
GLOBULIN, TOTAL: 2.1 g/dL (ref 1.5–4.5)
Glucose: 132 mg/dL — ABNORMAL HIGH (ref 65–99)
Potassium: 4.3 mmol/L (ref 3.5–5.2)
SODIUM: 139 mmol/L (ref 134–144)
Total Bilirubin: 0.2 mg/dL (ref 0.0–1.2)
Total Protein: 6.1 g/dL (ref 6.0–8.5)

## 2014-07-22 LAB — MICROALBUMIN, URINE: Microalbumin, Urine: 10.4 ug/mL (ref 0.0–17.0)

## 2014-08-09 ENCOUNTER — Telehealth: Payer: Self-pay | Admitting: Pharmacist

## 2014-08-09 NOTE — Telephone Encounter (Signed)
Patient is due prolia - ordered.   appt made for 08/14/14 at 3:40pm.  Tried to call to notify patient - no answer. Left message to call office.

## 2014-08-11 NOTE — Telephone Encounter (Signed)
Pt aware if prolia appt on 08/14/14 at 3:40 pm.  rs

## 2014-08-14 ENCOUNTER — Ambulatory Visit (INDEPENDENT_AMBULATORY_CARE_PROVIDER_SITE_OTHER): Payer: Medicare Other | Admitting: Pharmacist

## 2014-08-14 ENCOUNTER — Encounter: Payer: Self-pay | Admitting: Pharmacist

## 2014-08-14 VITALS — Ht 64.0 in | Wt 154.5 lb

## 2014-08-14 DIAGNOSIS — M810 Age-related osteoporosis without current pathological fracture: Secondary | ICD-10-CM | POA: Diagnosis not present

## 2014-08-14 DIAGNOSIS — Z23 Encounter for immunization: Secondary | ICD-10-CM | POA: Diagnosis not present

## 2014-08-14 MED ORDER — DENOSUMAB 60 MG/ML ~~LOC~~ SOLN
60.0000 mg | Freq: Once | SUBCUTANEOUS | Status: AC
  Administered 2014-08-14: 60 mg via SUBCUTANEOUS

## 2014-08-14 NOTE — Progress Notes (Signed)
Patient ID: Lindsay Villarreal, female   DOB: 08/12/1946, 68 y.o.   MRN: [id number]  [facility]   HPI: Patient with osteoporosis here today for Prolia injection . She has had 3 Prolia injectiond previously with no adverse effects noted.   Last prolia injection 01/2014   Calcium Assessment Calcium Intake  # of servings/day  Calcium mg  Milk (8 oz) 0  x  300  = 0  Yogurt (4 oz) 1 x  200 = 200  Cheese (1 oz) 0 x  200 = 0  Other Calcium sources   250mg   Ca supplement 1000mg  = 1000mg    Estimated calcium intake per day 1450mg    Last Dexa 12/01/2012 Lowest T-Score was -2.9 at [facility] spine  Assessment: osteoporosis  Recommendations: 1. Prolia 60mg  given SQ in left arm. 2.  Continue calcium 1200mg  daily through supplementation or diet.  3.  recommend weight bearing exercise - 30 minutes at least 4 days  per week.   4.  Counseled and educated about fall risk and prevention. 5.   Influenza vaccine given in office today   Recheck DEXA:  January 2016  Time spent counseling patient:  20 minutes  Cherre Robins, PharmD, CPP

## 2014-10-30 ENCOUNTER — Ambulatory Visit (INDEPENDENT_AMBULATORY_CARE_PROVIDER_SITE_OTHER): Payer: Medicare Other | Admitting: Nurse Practitioner

## 2014-10-30 ENCOUNTER — Encounter: Payer: Self-pay | Admitting: Nurse Practitioner

## 2014-10-30 VITALS — BP 124/71 | HR 75 | Temp 97.2°F | Ht 64.0 in | Wt 152.0 lb

## 2014-10-30 DIAGNOSIS — E785 Hyperlipidemia, unspecified: Secondary | ICD-10-CM

## 2014-10-30 DIAGNOSIS — E118 Type 2 diabetes mellitus with unspecified complications: Secondary | ICD-10-CM

## 2014-10-30 DIAGNOSIS — M810 Age-related osteoporosis without current pathological fracture: Secondary | ICD-10-CM

## 2014-10-30 DIAGNOSIS — I10 Essential (primary) hypertension: Secondary | ICD-10-CM | POA: Diagnosis not present

## 2014-10-30 DIAGNOSIS — D649 Anemia, unspecified: Secondary | ICD-10-CM

## 2014-10-30 DIAGNOSIS — E1142 Type 2 diabetes mellitus with diabetic polyneuropathy: Secondary | ICD-10-CM

## 2014-10-30 DIAGNOSIS — Z23 Encounter for immunization: Secondary | ICD-10-CM | POA: Diagnosis not present

## 2014-10-30 LAB — POCT GLYCOSYLATED HEMOGLOBIN (HGB A1C): HEMOGLOBIN A1C: 6.5

## 2014-10-30 NOTE — Addendum Note (Signed)
Addended by: Rolena Infante on: 10/30/2014 01:01 PM   Modules accepted: Orders

## 2014-10-30 NOTE — Patient Instructions (Signed)

## 2014-10-30 NOTE — Progress Notes (Signed)
Subjective:    Patient ID: Lindsay Villarreal, female    DOB: 25-Apr-1946, 68 y.o.   MRN: [id number]  Patient here today for follow up of chronic medical problems-   Hyperlipidemia This is a chronic problem. The current episode started more than 1 year ago. The problem is controlled. Recent lipid tests were reviewed and are normal. Exacerbating diseases include diabetes. She has no history of hypothyroidism or obesity. Pertinent negatives include no shortness of breath. Current antihyperlipidemic treatment includes statins. The current treatment provides moderate improvement of lipids. There are no compliance problems.  Risk factors for coronary artery disease include diabetes mellitus, dyslipidemia, hypertension and post-menopausal.  Diabetes She presents for her follow-up diabetic visit. She has type 2 diabetes mellitus. No MedicAlert identification noted. Her disease course has been stable. Pertinent negatives for hypoglycemia include no headaches. There are no hypoglycemic complications. Symptoms are stable. Current diabetic treatment includes oral agent (monotherapy). Her weight is stable. She is following a diabetic diet. When asked about meal planning, she reported none. She has not had a previous visit with a dietitian. She rarely participates in exercise. Her breakfast blood glucose is taken between 9-10 am. Her breakfast blood glucose range is generally 110-130 mg/dl. Her overall blood glucose range is 110-130 mg/dl. An ACE inhibitor/angiotensin II receptor blocker is being taken. She does not see a podiatrist.Eye exam is not current.  Hypertension This is a chronic problem. The current episode started more than 1 year ago. The problem is controlled. Pertinent negatives include no headaches, neck pain, palpitations or shortness of breath. Risk factors for coronary artery disease include diabetes mellitus, dyslipidemia and post-menopausal state. Past treatments include ACE inhibitors. The current  treatment provides significant improvement. There are no compliance problems.   Anemia Currently on nuiron- was released by hematologist- just said for [place] to keep watch on labs. Pt denies fatigue. Occasionally constipation.  Osteoporosis Prolia every 6 months- no side effects Diabetic neuropathy neurotin really helping a lot with the burning sensation bil feet    Review of Systems  Constitutional: Negative.   HENT: Negative.   Respiratory: Negative for shortness of breath.   Cardiovascular: Negative for palpitations.  Musculoskeletal: Negative for neck pain.  Neurological: Negative for headaches.  All other systems reviewed and are negative.      Objective:   Physical Exam  Constitutional: She is oriented to person, place, and time. She appears well-developed and well-nourished.  HENT:  Nose: Nose normal.  Mouth/Throat: Oropharynx is clear and moist.  Eyes: EOM are normal.  Neck: Trachea normal, normal range of motion and full passive range of motion without pain. Neck supple. No JVD present. Carotid bruit is not present. No thyromegaly present.  Cardiovascular: Normal rate, regular rhythm, normal heart sounds and intact distal pulses.  Exam reveals no gallop and no friction rub.   No murmur heard. Pulmonary/Chest: Effort normal and breath sounds normal.  Abdominal: Soft. Bowel sounds are normal. She exhibits no distension and no mass. There is no tenderness.  Musculoskeletal: Normal range of motion.  Lymphadenopathy:    She has no cervical adenopathy.  Neurological: She is alert and oriented to person, place, and time. She has normal reflexes.  Skin: Skin is warm and dry.  Psychiatric: She has a normal mood and affect. Her behavior is normal. Judgment and thought content normal.   BP 124/71 mmHg  Pulse 75  Temp(Src) 97.2 F (36.2 C) (Oral)  Ht '5\' 4"'  (1.626 m)  Wt 152 lb (68.947 kg)  BMI 26.08 kg/m2   Results for orders placed or performed in visit on 10/30/14  POCT  glycosylated hemoglobin (Hb A1C)  Result Value Ref Range   Hemoglobin A1C 6.5%         Assessment & Plan:   1. Hyperlipidemia   2. Essential hypertension   3. Diabetic polyneuropathy associated with type 2 diabetes mellitus   4. Diabetes mellitus type 2 with complications   5. Osteoporosis   6. Anemia, unspecified anemia type    Orders Placed This Encounter  Procedures  . CMP14+EGFR  . NMR, lipoprofile  . Anemia Profile B  . POCT glycosylated hemoglobin (Hb A1C)   Health maintenance reviewed Low fta diet and erxercise Follow up in 3 months  [place], FNP

## 2014-10-31 LAB — NMR, LIPOPROFILE
Cholesterol: 120 mg/dL (ref 100–199)
HDL Cholesterol by NMR: 35 mg/dL — ABNORMAL LOW (ref >39)
HDL PARTICLE NUMBER: 32.3 umol/L (ref >=30.5)
LDL Particle Number: 677 nmol/L (ref <1000)
LDL SIZE: 19.6 nm (ref >20.5)
LDL-C: 10 mg/dL (ref 0–99)
LP-IR Score: 74 — ABNORMAL HIGH (ref <=45)
SMALL LDL PARTICLE NUMBER: 510 nmol/L (ref <=527)
TRIGLYCERIDES BY NMR: 373 mg/dL — AB (ref 0–149)

## 2014-10-31 LAB — ANEMIA PROFILE B
BASOS: 0 %
Basophils Absolute: 0 10*3/uL (ref 0.0–0.2)
EOS: 1 %
Eosinophils Absolute: 0.1 10*3/uL (ref 0.0–0.4)
FERRITIN: 72 ng/mL (ref 15–150)
Folate: 8.7 ng/mL (ref >3.0)
HEMATOCRIT: 39.3 % (ref 34.0–46.6)
HEMOGLOBIN: 13.1 g/dL (ref 11.1–15.9)
Immature Grans (Abs): 0 10*3/uL (ref 0.0–0.1)
Immature Granulocytes: 0 %
Iron Saturation: 29 % (ref 15–55)
Iron: 95 ug/dL (ref 35–155)
LYMPHS ABS: 2.9 10*3/uL (ref 0.7–3.1)
Lymphs: 36 %
MCH: 29.2 pg (ref 26.6–33.0)
MCHC: 33.3 g/dL (ref 31.5–35.7)
MCV: 88 fL (ref 79–97)
Monocytes Absolute: 0.6 10*3/uL (ref 0.1–0.9)
Monocytes: 8 %
Neutrophils Absolute: 4.3 10*3/uL (ref 1.4–7.0)
Neutrophils Relative %: 55 %
PLATELETS: 285 10*3/uL (ref 150–379)
RBC: 4.49 x10E6/uL (ref 3.77–5.28)
RDW: 13.6 % (ref 12.3–15.4)
Retic Ct Pct: 0.9 % (ref 0.6–2.6)
TIBC: 330 ug/dL (ref 250–450)
UIBC: 235 ug/dL (ref 150–375)
Vitamin B-12: >1999 pg/mL — ABNORMAL HIGH (ref 211–946)
WBC: 7.9 10*3/uL (ref 3.4–10.8)

## 2014-10-31 LAB — CMP14+EGFR
A/G RATIO: 2.3 (ref 1.1–2.5)
ALT: 10 IU/L (ref 0–32)
AST: 10 IU/L (ref 0–40)
Albumin: 4.1 g/dL (ref 3.6–4.8)
Alkaline Phosphatase: 53 IU/L (ref 39–117)
BUN/Creatinine Ratio: 27 — ABNORMAL HIGH (ref 11–26)
BUN: 16 mg/dL (ref 8–27)
CALCIUM: 9.3 mg/dL (ref 8.7–10.3)
CO2: 25 mmol/L (ref 18–29)
CREATININE: 0.6 mg/dL (ref 0.57–1.00)
Chloride: 105 mmol/L (ref 97–108)
GFR, EST AFRICAN AMERICAN: 108 mL/min/{1.73_m2} (ref >59)
GFR, EST NON AFRICAN AMERICAN: 94 mL/min/{1.73_m2} (ref >59)
GLOBULIN, TOTAL: 1.8 g/dL (ref 1.5–4.5)
Glucose: 103 mg/dL — ABNORMAL HIGH (ref 65–99)
Potassium: 4.4 mmol/L (ref 3.5–5.2)
SODIUM: 142 mmol/L (ref 134–144)
Total Protein: 5.9 g/dL — ABNORMAL LOW (ref 6.0–8.5)

## 2014-11-02 ENCOUNTER — Other Ambulatory Visit: Payer: Self-pay | Admitting: Nurse Practitioner

## 2014-11-02 MED ORDER — FENOFIBRATE 160 MG PO TABS: 160.0000 mg | ORAL_TABLET | Freq: Every day | ORAL | Status: DC

## 2014-12-06 ENCOUNTER — Ambulatory Visit: Payer: Medicare Other

## 2014-12-06 ENCOUNTER — Other Ambulatory Visit: Payer: Medicare Other

## 2014-12-30 ENCOUNTER — Other Ambulatory Visit: Payer: Self-pay | Admitting: Nurse Practitioner

## 2015-01-04 ENCOUNTER — Other Ambulatory Visit: Payer: Self-pay | Admitting: Nurse Practitioner

## 2015-01-10 ENCOUNTER — Ambulatory Visit (INDEPENDENT_AMBULATORY_CARE_PROVIDER_SITE_OTHER): Payer: Medicare Other

## 2015-01-10 ENCOUNTER — Ambulatory Visit (INDEPENDENT_AMBULATORY_CARE_PROVIDER_SITE_OTHER): Payer: Medicare Other | Admitting: Pharmacist

## 2015-01-10 ENCOUNTER — Encounter: Payer: Self-pay | Admitting: Pharmacist

## 2015-01-10 VITALS — Ht 64.0 in | Wt 151.0 lb

## 2015-01-10 DIAGNOSIS — M810 Age-related osteoporosis without current pathological fracture: Secondary | ICD-10-CM | POA: Diagnosis not present

## 2015-01-10 NOTE — Progress Notes (Signed)
Patient ID: Lindsay Villarreal, female   DOB: Apr 16, 1946, 69 y.o.   MRN: [id number]   [facility] Current Height: Height: 5\' 4"  (162.6 cm)      Max Lifetime Height:  5' 5.5" Current Weight: Weight: 151 lb (68.493 kg)       Ethnicity:Caucasian    HPI: Patient with osteoporosis.  Started Prolia injections 12/22/2012.  She has tolerated well.    Back Pain?  Yes       Kyphosis?  No Prior fracture?  No Med(s) for Osteoporosis/Osteopenia:  Prolia 60mg  injected SQ q 6 months Med(s) previously tried for Osteoporosis/Osteopenia:  Fosamax and actonel both made bones and legs hurt                                                             PMH: Age at menopause:  50;s Hysterectomy?  Yes Oophorectomy?  No HRT? Yes - Former.  Type/duration: premarin for 5 years Steroid Use?  No Thyroid med?  No History of cancer?  No History of digestive disorders (ie Crohn's)?  No Current or previous eating disorders?  No Last Vitamin D Result:  30 (11/2012 Last GFR Result:  94 (10/30/2014)   FH/SH: Family history of osteoporosis?  Yes - mother Parent with history of hip fracture?  No Family history of breast cancer?  Yes - sister Exercise?  Yes - walking when its warm Smoking?  Yes   Alcohol?  No    Calcium Assessment Calcium Intake  # of servings/day  Calcium mg  Milk (8 oz) 1  x  300  = 300mg   Yogurt (4 oz) 0 x  200 = 0  Cheese (1 oz) 0 x  200 = 0  Other Calcium sources   250mg   Ca supplement 1000mg  = 1000mg    Estimated calcium intake per day 1550mg     DEXA Results Date of Test T-Score for [facility] Sp[facility] L1-L4 T-Score for Total Left Hip T-Score for Total Right Hip  01/10/2015 -2.3 -0.5 -0.5  12/01/2012 -2.9 -0.8 -0.6  07/25/2009 -2.8 -0.6 -0.8        Assessment: Osteoporosis with improved BMD since starting Prolia  Recommendations: 1.  Continue  Prolia 60mg  SQ q 6 months - next due at end of March 2016 2.  continue calcium 1200mg  daily through supplementation or diet.  3.  recommend  weight bearing exercise - 30 minutes at least 4 days per week.  Discussed various exercises as well as [employer] and Balance Classes to the [facility].  Counseled and educated about fall risk and prevention.  Recheck DEXA:  2 years  Time spent counseling patient:  15 minutes   Cherre Robins, PharmD, CPP

## 2015-01-22 ENCOUNTER — Other Ambulatory Visit: Payer: Self-pay | Admitting: Nurse Practitioner

## 2015-02-08 ENCOUNTER — Telehealth: Payer: Self-pay | Admitting: *Deleted

## 2015-02-12 ENCOUNTER — Ambulatory Visit (INDEPENDENT_AMBULATORY_CARE_PROVIDER_SITE_OTHER): Payer: Medicare Other | Admitting: Pharmacist

## 2015-02-12 ENCOUNTER — Encounter: Payer: Self-pay | Admitting: Pharmacist

## 2015-02-12 VITALS — BP 104/70 | HR 75 | Ht 63.5 in | Wt 155.5 lb

## 2015-02-12 DIAGNOSIS — E1143 Type 2 diabetes mellitus with diabetic autonomic (poly)neuropathy: Secondary | ICD-10-CM

## 2015-02-12 DIAGNOSIS — M810 Age-related osteoporosis without current pathological fracture: Secondary | ICD-10-CM | POA: Diagnosis not present

## 2015-02-12 DIAGNOSIS — Z86010 Personal history of colonic polyps: Secondary | ICD-10-CM

## 2015-02-12 DIAGNOSIS — Z0000 Encounter for general adult medical examination without abnormal findings: Secondary | ICD-10-CM

## 2015-02-12 DIAGNOSIS — E1140 Type 2 diabetes mellitus with diabetic neuropathy, unspecified: Secondary | ICD-10-CM | POA: Insufficient documentation

## 2015-02-12 MED ORDER — DENOSUMAB 60 MG/ML ~~LOC~~ SOLN
60.0000 mg | Freq: Once | SUBCUTANEOUS | Status: AC
  Administered 2015-02-12: 60 mg via SUBCUTANEOUS

## 2015-02-12 NOTE — Progress Notes (Signed)
Patient ID: [name], female   DOB: [birth year], 69 y.o.   MRN: [id number]    Subjective:   MAKITA BLOW is a 69 y.o. female who presents for an Initial Medicare Annual Wellness Visit.  She also has osteoporosis and is due her Prolia injection today.  Her last Dexa was 01/10/2015.  Also when reviewing her medications she is not taking fenofibrate and was to sure what this medication was prescribed for.  I reviewed her labs from 10/2014 and it appears that fenofibrate was started 10/2014 after her last lipid panel showed elevated TG.  Patient is pretty sure she was not fasting when those labs were drawn.    Current Medications (verified) Outpatient Encounter Prescriptions as of 02/12/2015  Medication Sig  . aspirin 81 MG tablet Take 81 mg by mouth daily.  [name] b complex vitamins capsule Take 1 capsule by mouth daily.  . calcium carbonate (OS-CAL) 600 MG TABS tablet Take 600 mg by mouth daily with breakfast.  . denosumab (PROLIA) 60 MG/ML SOLN injection Inject 60 mg into the skin every 6 (six) months. Administer in upper arm, thigh, or abdomen  . ferrous sulfate 325 (65 FE) MG EC tablet Take 325 mg by mouth daily.  [name] gabapentin (NEURONTIN) 100 MG capsule TAKE ONE CAPSULE BY MOUTH THREE TIMES DAILY  . glucose blood (ONE TOUCH ULTRA TEST) test strip USE ONE  EVERY DAY AS DIRECTED  . lisinopril (PRINIVIL,ZESTRIL) 5 MG tablet TAKE ONE TABLET BY MOUTH ONCE DAILY  . metFORMIN (GLUCOPHAGE) 500 MG tablet TAKE ONE TABLET BY MOUTH TWICE DAILY  . ONETOUCH DELICA LANCETS 26O MISC USE TO TEST BLOOD GLUCOSE EVERY DAY OR AS DIRECTED  . simvastatin (ZOCOR) 20 MG tablet TAKE ONE TABLET BY MOUTH ONCE DAILY  . [DISCONTINUED] calcium carbonate 200 MG capsule Take 250 mg by mouth 2 (two) times daily with a meal.  . [DISCONTINUED] cholecalciferol (VITAMIN D) 1000 UNITS tablet Take 1,000 Units by mouth daily.  . fenofibrate 160 MG tablet Take 1 tablet (160 mg total) by mouth daily. (Patient not taking: Reported on  02/12/2015)  . [DISCONTINUED] iron polysaccharides (NU-IRON) 150 MG capsule Take 1 capsule (150 mg total) by mouth daily. (Patient not taking: Reported on 02/12/2015)  . [DISCONTINUED] thiamine (VITAMIN B-1) 100 MG tablet Take 100 mg by mouth daily.  . [EXPIRED] denosumab (PROLIA) injection 60 mg   . [DISCONTINUED] 0.9 %  sodium chloride infusion     Allergies (verified) Codeine   History: Past Medical History  Diagnosis Date  . Anemia   . Cataract   . GERD (gastroesophageal reflux disease)   . Hyperlipidemia   . Heart murmur   . Osteoporosis   . Diabetes mellitus without complication   . Neuromuscular disorder    Past Surgical History  Procedure Laterality Date  . Cataract extraction w/ intraocular lens implant  2010 (left), 2013 (right)  . Appendectomy  1978  . Eye surgery    . Tubal ligation    . Vaginal hysterectomy  1978  . Bladder repair     Family History  Problem Relation Age of Onset  . Stomach cancer Father   . Pancreatic cancer Father   . Liver cancer Father   . Other Sister 14    Cancer of Bile Duct  . Breast cancer Sister   . Diabetes Sister   . Colon cancer Neg Hx   . Brain cancer Sister 19  . Heart attack Mother   . Heart failure Mother   .  Sarcoidosis Sister    Social History   Occupational History  . Not on file.   Social History Main Topics  . Smoking status: Current Every Day Smoker -- 0.50 packs/day  . Smokeless tobacco: Never Used  . Alcohol Use: No  . Drug Use: No  . Sexual Activity: No    Do you feel safe at home?  Yes  Dietary issues and exercise activities: Current Exercise Habits:: Home exercise routine, Type of exercise: walking, Time (Minutes): 10, Frequency (Times/Week): 1, Weekly Exercise (Minutes/Week): 10, Intensity: Mild  Current Dietary habits:  Tries to limit sugar intake and limit CHOs in diet.   Objective:    Today's Vitals   02/12/15 1101  BP: 104/70  Pulse: 75  Height: 5' 3.5" (1.613 m)  Weight: 155 lb 8 oz  (70.534 kg)  PainSc: 1    Body mass index is 27.11 kg/(m^2).  Activities of Daily Living In your present state of health, do you have any difficulty performing the following activities: 02/12/2015 07/21/2014  Is the patient deaf or have difficulty hearing? N N  Hearing N N  Vision N N  Difficulty concentrating or making decisions N N  Walking or climbing stairs? N N  Doing errands, shopping? N N  Preparing Food and eating ? N -  Using the Toilet? N -  In the past six months, have you accidently leaked urine? N -  Do you have problems with loss of bowel control? N -  Managing your Medications? N -  Managing your Finances? N -  Housekeeping or managing your Housekeeping? N -    Are there smokers in your home (other than you)? No   Cardiac Risk Factors include: advanced age (>94men, >43 women);diabetes mellitus;dyslipidemia;sedentary lifestyle;smoking/ tobacco exposure   Depression Screen PHQ 2/9 Scores 02/12/2015 10/30/2014 07/21/2014 01/11/2014  PHQ - 2 Score 0 0 0 0    Fall Risk Fall Risk  02/12/2015 10/30/2014 07/21/2014 01/11/2014 09/09/2013  Falls in the past year? No No No No No    Cognitive Function: MMSE - Mini Mental State Exam 02/12/2015  Orientation to time 5  Orientation to Place 5  Registration 3  Attention/ Calculation 5  Recall 3  Language- name 2 objects 2  Language- repeat 1  Language- follow 3 step command 3  Language- read & follow direction 1  Write a sentence 1  Copy design 1  Total score 30    Immunizations and Health Maintenance Immunization History  Administered Date(s) Administered  . Influenza,inj,Quad PF,36+ Mos 09/09/2013, 08/14/2014  . Pneumococcal Conjugate-13 10/30/2014   Health Maintenance Due  Topic Date Due  . OPHTHALMOLOGY EXAM  12/03/2013  . LIPID PANEL  01/29/2015    Patient Care Team: [name], FNP as PCP - General (Nurse Practitioner) Okey Regal, [place] as Consulting Physician (Optometry) Annia Belt, MD as  Consulting Physician (Hematology) Irene Shipper, MD as Consulting Physician (Gastroenterology) Patient also goes to [facility] on [address] but was unable to recall physician she sees there.   Indicate any recent Medical Services you may have received from other than [facility] providers in the past year (date may be approximate).    Assessment:    Annual Wellness Visit  Osteoporosis  Screening Tests Health Maintenance  Topic Date Due  . OPHTHALMOLOGY EXAM  12/03/2013  . LIPID PANEL  01/29/2015  . ZOSTAVAX  06/18/2015 (Originally 02/21/2006)  . COLON CANCER SCREENING ANNUAL FOBT  01/29/2016 (Originally 08/20/2014)  . HEMOGLOBIN A1C  05/01/2015  .  INFLUENZA VACCINE  06/18/2015  . URINE MICROALBUMIN  07/22/2015  . FOOT EXAM  10/31/2015  . PNA vac Low Risk Adult (2 of 2 - PPSV23) 10/31/2015  . MAMMOGRAM  02/07/2016  . DEXA SCAN  01/10/2017  . COLONOSCOPY  12/10/2021  . TETANUS/TDAP  07/18/2022        Plan:   During the course of the visit Amellia was educated and counseled about the following appropriate screening and preventive services:   Vaccines to include Pneumoccal, Influenza,  Td, Zostavax - all vaccines UTD except Zostavax.  Checked price - was over $200 so patient decided to postpone.  Colorectal cancer screening - due recheck - referral sent to Dr Henrene Pastor  Cardiovascular disease screening -UTD  Due to have labs rechecked next week.  Recommend to not start fenofibrate until lipids rechecked.  Reminded patient it is important to fast.    Bone Denisty / Osteoporosis Screening - UTD  Prolia 60mg  SQ administered in office today  Mammogram - appt made in office today  PAP - no longer required.  Patient denies bleeding or vaginal pain.  Glaucoma screening / Diabetic Eye Exam - patient due.  Patient reminded to make appt and about how important yearly eye checks are for diabetics  Nutrition counseling - discussed dietary calcium and CHO intake  Advanced  Directives - information packet given   Patient Instructions (the written plan) were given to the patient.   Cherre Robins, [facility]   02/12/2015

## 2015-02-12 NOTE — Patient Instructions (Signed)
Health Maintenance Summary     OPHTHALMOLOGY EXAM Overdue 12/03/2013     LIPID PANEL Overdue 01/29/2015 Will get nest week    ZOSTAVAX Postponed 06/18/2015 Cost was $230.00 today    HEMOGLOBIN A1C Next Due 05/01/2015     INFLUENZA VACCINE Next Due 06/18/2015     URINE MICROALBUMIN Next Due 07/22/2015     FOOT EXAM Next Due 10/31/2015     Pneumonia vaccine Next Due 10/31/2015     MAMMOGRAM Next Due 02/07/2015     DEXA SCAN Next Due 01/10/2017     COLONOSCOPY Next Due 12/10/2014 Referral sent    TETANUS/TDAP Next Due 07/18/2022       Mammogram appointment - Monday, April 25th 2016 at 9:45am - [facility] A healthy lifestyle and preventive care can promote health and wellness. Preventive health guidelines for women include the following key practices.  A routine yearly physical is a good way to check with your health care provider about your health and preventive screening. It is a chance to share any concerns and updates on your health and to receive a thorough exam.  Visit your dentist for a routine exam and preventive care every 6 months. Brush your teeth twice a day and floss once a day. Good oral hygiene prevents tooth decay and gum disease.  The frequency of eye exams is based on your age, health, family medical history, use of contact lenses, and other factors. Follow your health care provider's recommendations for frequency of eye exams.  Eat a healthy diet. Foods like vegetables, fruits, whole grains, low-fat dairy products, and lean protein foods contain the nutrients you need without too many calories. Decrease your intake of foods high in solid fats, added sugars, and salt. Eat the right amount of calories for you.Get information about a proper diet from your health care provider, if necessary.  Regular physical exercise is one of the most important things you can do for your health. Most adults should get at least 150  minutes of moderate-intensity exercise (any activity that increases your heart rate and causes you to sweat) each week. In addition, most adults need muscle-strengthening exercises on 2 or more days a week.  Maintain a healthy weight. The body mass index (BMI) is a screening tool to identify possible weight problems. It provides an estimate of body fat based on height and weight. Your health care provider can find your BMI and can help you achieve or maintain a healthy weight.For adults 20 years and older:  A BMI below 18.5 is considered underweight.  A BMI of 18.5 to 24.9 is normal.  A BMI of 25 to 29.9 is considered overweight.  A BMI of 30 and above is considered obese.  Maintain normal blood lipids and cholesterol levels by exercising and minimizing your intake of saturated fat. Eat a balanced diet with plenty of fruit and vegetables. Blood tests for lipids and cholesterol should begin at age 7 and be repeated every 5 years. If your lipid or cholesterol levels are high, you are over 50, or you are at high risk for heart disease, you may need your cholesterol levels checked more frequently.Ongoing high lipid and cholesterol levels should be treated with medicines if diet and exercise are not working.  If you smoke, find out from your health care provider how to quit. If you do not use tobacco, do not start.  Lung cancer screening is recommended for adults  aged 41-80 years who are at high risk for developing lung cancer because of a history of smoking. A yearly low-dose CT scan of the lungs is recommended for people who have at least a 30-pack-year history of smoking and are a current smoker or have quit within the past 15 years. A pack year of smoking is smoking an average of 1 pack of cigarettes a day for 1 year (for example: 1 pack a day for 30 years or 2 packs a day for 15 years). Yearly screening should continue until the smoker has stopped smoking for at least 15 years. Yearly screening  should be stopped for people who develop a health problem that would prevent them from having lung cancer treatment.  If you are pregnant, do not drink alcohol. If you are breastfeeding, be very cautious about drinking alcohol. If you are not pregnant and choose to drink alcohol, do not have more than 1 drink per day. One drink is considered to be 12 ounces (355 mL) of beer, 5 ounces (148 mL) of wine, or 1.5 ounces (44 mL) of liquor.  Avoid use of street drugs. Do not share needles with anyone. Ask for help if you need support or instructions about stopping the use of drugs.  High blood pressure causes heart disease and increases the risk of stroke. Your blood pressure should be checked at least every 1 to 2 years. Ongoing high blood pressure should be treated with medicines if weight loss and exercise do not work.  If you are 73-25 years old, ask your health care provider if you should take aspirin to prevent strokes.  Diabetes screening involves taking a blood sample to check your fasting blood sugar level. This should be done once every 3 years, after age 44, if you are within normal weight and without risk factors for diabetes. Testing should be considered at a younger age or be carried out more frequently if you are overweight and have at least 1 risk factor for diabetes.  Breast cancer screening is essential preventive care for women. You should practice "breast self-awareness." This means understanding the normal appearance and feel of your breasts and may include breast self-examination. Any changes detected, no matter how small, should be reported to a health care provider. Women in their 41s and 30s should have a clinical breast exam (CBE) by a health care provider as part of a regular health exam every 1 to 3 years. After age 46, women should have a CBE every year. Starting at age 73, women should consider having a mammogram (breast X-ray test) every year. Women who have a family history of  breast cancer should talk to their health care provider about genetic screening. Women at a high risk of breast cancer should talk to their health care providers about having an MRI and a mammogram every year.  Breast cancer gene (BRCA)-related cancer risk assessment is recommended for women who have family members with BRCA-related cancers. BRCA-related cancers include breast, ovarian, tubal, and peritoneal cancers. Having family members with these cancers may be associated with an increased risk for harmful changes (mutations) in the breast cancer genes BRCA1 and BRCA2. Results of the assessment will determine the need for genetic counseling and BRCA1 and BRCA2 testing.  Routine pelvic exams to screen for cancer are no longer recommended for nonpregnant women who are considered low risk for cancer of the pelvic organs (ovaries, uterus, and vagina) and who do not have symptoms. Ask your health care provider if a  screening pelvic exam is right for you.  If you have had past treatment for cervical cancer or a condition that could lead to cancer, you need Pap tests and screening for cancer for at least 20 years after your treatment. If Pap tests have been discontinued, your risk factors (such as having a new sexual partner) need to be reassessed to determine if screening should be resumed. Some women have medical problems that increase the chance of getting cervical cancer. In these cases, your health care provider may recommend more frequent screening and Pap tests.  The HPV test is an additional test that may be used for cervical cancer screening. The HPV test looks for the virus that can cause the cell changes on the cervix. The cells collected during the Pap test can be tested for HPV. The HPV test could be used to screen women aged 5 years and older, and should be used in women of any age who have unclear Pap test results. After the age of 63, women should have HPV testing at the same frequency as a Pap  test.  Colorectal cancer can be detected and often prevented. Most routine colorectal cancer screening begins at the age of 1 years and continues through age 19 years. However, your health care provider may recommend screening at an earlier age if you have risk factors for colon cancer. On a yearly basis, your health care provider may provide home test kits to check for hidden blood in the stool. Use of a small camera at the end of a tube, to directly examine the colon (sigmoidoscopy or colonoscopy), can detect the earliest forms of colorectal cancer. Talk to your health care provider about this at age 42, when routine screening begins. Direct exam of the colon should be repeated every 5-10 years through age 41 years, unless early forms of pre-cancerous polyps or small growths are found.  People who are at an increased risk for hepatitis B should be screened for this virus. You are considered at high risk for hepatitis B if:  You were born in a country where hepatitis B occurs often. Talk with your health care provider about which countries are considered high risk.  Your parents were born in a high-risk country and you have not received a shot to protect against hepatitis B (hepatitis B vaccine).  You have HIV or AIDS.  You use needles to inject street drugs.  You live with, or have sex with, someone who has hepatitis B.  You get hemodialysis treatment.  You take certain medicines for conditions like cancer, organ transplantation, and autoimmune conditions.  Hepatitis C blood testing is recommended for all people born from [birth year] through 1965 and any individual with known risks for hepatitis C.  Practice safe sex. Use condoms and avoid high-risk sexual practices to reduce the spread of sexually transmitted infections (STIs). STIs include gonorrhea, chlamydia, syphilis, trichomonas, herpes, HPV, and human immunodeficiency virus (HIV). Herpes, HIV, and HPV are viral illnesses that have no cure.  They can result in disability, cancer, and death.  You should be screened for sexually transmitted illnesses (STIs) including gonorrhea and chlamydia if:  You are sexually active and are younger than 24 years.  You are older than 24 years and your health care provider tells you that you are at risk for this type of infection.  Your sexual activity has changed since you were last screened and you are at an increased risk for chlamydia or gonorrhea. Ask your health care provider  if you are at risk.  If you are at risk of being infected with HIV, it is recommended that you take a prescription medicine daily to prevent HIV infection. This is called preexposure prophylaxis (PrEP). You are considered at risk if:  You are a heterosexual woman, are sexually active, and are at increased risk for HIV infection.  You take drugs by injection.  You are sexually active with a partner who has HIV.  Talk with your health care provider about whether you are at high risk of being infected with HIV. If you choose to begin PrEP, you should first be tested for HIV. You should then be tested every 3 months for as long as you are taking PrEP.  Osteoporosis is a disease in which the bones lose minerals and strength with aging. This can result in serious bone fractures or breaks. The risk of osteoporosis can be identified using a bone density scan. Women ages 37 years and over and women at risk for fractures or osteoporosis should discuss screening with their health care providers. Ask your health care provider whether you should take a calcium supplement or vitamin D to reduce the rate of osteoporosis.  Menopause can be associated with physical symptoms and risks. Hormone replacement therapy is available to decrease symptoms and risks. You should talk to your health care provider about whether hormone replacement therapy is right for you.  Use sunscreen. Apply sunscreen liberally and repeatedly throughout the day.  You should seek shade when your shadow is shorter than you. Protect yourself by wearing long sleeves, pants, a wide-brimmed hat, and sunglasses year round, whenever you are outdoors.  Once a month, do a whole body skin exam, using a mirror to look at the skin on your back. Tell your health care provider of new moles, moles that have irregular borders, moles that are larger than a pencil eraser, or moles that have changed in shape or color.  Stay current with required vaccines (immunizations).  Influenza vaccine. All adults should be immunized every year.  Tetanus, diphtheria, and acellular pertussis (Td, Tdap) vaccine. Pregnant women should receive 1 dose of Tdap vaccine during each pregnancy. The dose should be obtained regardless of the length of time since the last dose. Immunization is preferred during the 27th-36th week of gestation. An adult who has not previously received Tdap or who does not know her vaccine status should receive 1 dose of Tdap. This initial dose should be followed by tetanus and diphtheria toxoids (Td) booster doses every 10 years. Adults with an unknown or incomplete history of completing a 3-dose immunization series with Td-containing vaccines should begin or complete a primary immunization series including a Tdap dose. Adults should receive a Td booster every 10 years.  Varicella vaccine. An adult without evidence of immunity to varicella should receive 2 doses or a second dose if she has previously received 1 dose. Pregnant females who do not have evidence of immunity should receive the first dose after pregnancy. This first dose should be obtained before leaving the health care facility. The second dose should be obtained 4-8 weeks after the first dose.  Human papillomavirus (HPV) vaccine. Females aged 13-26 years who have not received the vaccine previously should obtain the 3-dose series. The vaccine is not recommended for use in pregnant females. However, pregnancy  testing is not needed before receiving a dose. If a female is found to be pregnant after receiving a dose, no treatment is needed. In that case, the remaining doses  should be delayed until after the pregnancy. Immunization is recommended for any person with an immunocompromised condition through the age of 38 years if she did not get any or all doses earlier. During the 3-dose series, the second dose should be obtained 4-8 weeks after the first dose. The third dose should be obtained 24 weeks after the first dose and 16 weeks after the second dose.  Zoster vaccine. One dose is recommended for adults aged 33 years or older unless certain conditions are present.  Measles, mumps, and rubella (MMR) vaccine. Adults born before [birth year] generally are considered immune to measles and mumps. Adults born in [birth year] or later should have 1 or more doses of MMR vaccine unless there is a contraindication to the vaccine or there is laboratory evidence of immunity to each of the three diseases. A routine second dose of MMR vaccine should be obtained at least 28 days after the first dose for students attending postsecondary schools, health care workers, or international travelers. People who received inactivated measles vaccine or an unknown type of measles vaccine during 1963-1967 should receive 2 doses of MMR vaccine. People who received inactivated mumps vaccine or an unknown type of mumps vaccine before 1979 and are at high risk for mumps infection should consider immunization with 2 doses of MMR vaccine. For females of childbearing age, rubella immunity should be determined. If there is no evidence of immunity, females who are not pregnant should be vaccinated. If there is no evidence of immunity, females who are pregnant should delay immunization until after pregnancy. Unvaccinated health care workers born before [birth year] who lack laboratory evidence of measles, mumps, or rubella immunity or laboratory confirmation of disease should  consider measles and mumps immunization with 2 doses of MMR vaccine or rubella immunization with 1 dose of MMR vaccine.  Pneumococcal 13-valent conjugate (PCV13) vaccine. When indicated, a person who is uncertain of her immunization history and has no record of immunization should receive the PCV13 vaccine. An adult aged 32 years or older who has certain medical conditions and has not been previously immunized should receive 1 dose of PCV13 vaccine. This PCV13 should be followed with a dose of pneumococcal polysaccharide (PPSV23) vaccine. The PPSV23 vaccine dose should be obtained at least 8 weeks after the dose of PCV13 vaccine. An adult aged 62 years or older who has certain medical conditions and previously received 1 or more doses of PPSV23 vaccine should receive 1 dose of PCV13. The PCV13 vaccine dose should be obtained 1 or more years after the last PPSV23 vaccine dose.  Pneumococcal polysaccharide (PPSV23) vaccine. When PCV13 is also indicated, PCV13 should be obtained first. All adults aged 93 years and older should be immunized. An adult younger than age 51 years who has certain medical conditions should be immunized. Any person who resides in a nursing home or long-term care facility should be immunized. An adult smoker should be immunized. People with an immunocompromised condition and certain other conditions should receive both PCV13 and PPSV23 vaccines. People with human immunodeficiency virus (HIV) infection should be immunized as soon as possible after diagnosis. Immunization during chemotherapy or radiation therapy should be avoided. Routine use of PPSV23 vaccine is not recommended for American Indians, [place] Natives, or people younger than 65 years unless there are medical conditions that require PPSV23 vaccine. When indicated, people who have unknown immunization and have no record of immunization should receive PPSV23 vaccine. One-time revaccination 5 years after the first dose of PPSV23 is  recommended for  people aged 19-64 years who have chronic kidney failure, nephrotic syndrome, asplenia, or immunocompromised conditions. People who received 1-2 doses of PPSV23 before age 75 years should receive another dose of PPSV23 vaccine at age 60 years or later if at least 5 years have passed since the previous dose. Doses of PPSV23 are not needed for people immunized with PPSV23 at or after age 38 years.  Meningococcal vaccine. Adults with asplenia or persistent complement component deficiencies should receive 2 doses of quadrivalent meningococcal conjugate (MenACWY-D) vaccine. The doses should be obtained at least 2 months apart. Microbiologists working with certain meningococcal bacteria, [employer] recruits, people at risk during an outbreak, and people who travel to or live in countries with a high rate of meningitis should be immunized. A first-year college student up through age 26 years who is living in a residence hall should receive a dose if she did not receive a dose on or after her 16th birthday. Adults who have certain high-risk conditions should receive one or more doses of vaccine.  Hepatitis A vaccine. Adults who wish to be protected from this disease, have certain high-risk conditions, work with hepatitis A-infected animals, work in hepatitis A research labs, or travel to or work in countries with a high rate of hepatitis A should be immunized. Adults who were previously unvaccinated and who anticipate close contact with an international adoptee during the first 60 days after arrival in the [place] from a country with a high rate of hepatitis A should be immunized.  Hepatitis B vaccine. Adults who wish to be protected from this disease, have certain high-risk conditions, may be exposed to blood or other infectious body fluids, are household contacts or sex partners of hepatitis B positive people, are clients or workers in certain care facilities, or travel to or work in countries  with a high rate of hepatitis B should be immunized.  Haemophilus influenzae type b (Hib) vaccine. A previously unvaccinated person with asplenia or sickle cell disease or having a scheduled splenectomy should receive 1 dose of Hib vaccine. Regardless of previous immunization, a recipient of a hematopoietic stem cell transplant should receive a 3-dose series 6-12 months after her successful transplant. Hib vaccine is not recommended for adults with HIV infection. Preventive Services / Frequency Ages 76 to 48 years  Blood pressure check.** / Every 1 to 2 years.  Lipid and cholesterol check.** / Every 5 years beginning at age 83.  Clinical breast exam.** / Every 3 years for women in their 67s and 47s.  BRCA-related cancer risk assessment.** / For women who have family members with a BRCA-related cancer (breast, ovarian, tubal, or peritoneal cancers).  Pap test.** / Every 2 years from ages 11 through 52. Every 3 years starting at age 64 through age 110 or 41 with a history of 3 consecutive normal Pap tests.  HPV screening.** / Every 3 years from ages 53 through ages 10 to 60 with a history of 3 consecutive normal Pap tests.  Hepatitis C blood test.** / For any individual with known risks for hepatitis C.  Skin self-exam. / Monthly.  Influenza vaccine. / Every year.  Tetanus, diphtheria, and acellular pertussis (Tdap, Td) vaccine.** / Consult your health care provider. Pregnant women should receive 1 dose of Tdap vaccine during each pregnancy. 1 dose of Td every 10 years.  Varicella vaccine.** / Consult your health care provider. Pregnant females who do not have evidence of immunity should receive the first dose after pregnancy.  HPV vaccine. /  3 doses over 6 months, if 26 and younger. The vaccine is not recommended for use in pregnant females. However, pregnancy testing is not needed before receiving a dose.  Measles, mumps, rubella (MMR) vaccine.** / You need at least 1 dose of MMR if you  were born in 1957 or later. You may also need a 2nd dose. For females of childbearing age, rubella immunity should be determined. If there is no evidence of immunity, females who are not pregnant should be vaccinated. If there is no evidence of immunity, females who are pregnant should delay immunization until after pregnancy.  Pneumococcal 13-valent conjugate (PCV13) vaccine.** / Consult your health care provider.  Pneumococcal polysaccharide (PPSV23) vaccine.** / 1 to 2 doses if you smoke cigarettes or if you have certain conditions.  Meningococcal vaccine.** / 1 dose if you are age 70 to 37 years and a Market researcher living in a residence hall, or have one of several medical conditions, you need to get vaccinated against meningococcal disease. You may also need additional booster doses.  Hepatitis A vaccine.** / Consult your health care provider.  Hepatitis B vaccine.** / Consult your health care provider.  Haemophilus influenzae type b (Hib) vaccine.** / Consult your health care provider. Ages 60 to 44 years  Blood pressure check.** / Every 1 to 2 years.  Lipid and cholesterol check.** / Every 5 years beginning at age 88 years.  Lung cancer screening. / Every year if you are aged 81-80 years and have a 30-pack-year history of smoking and currently smoke or have quit within the past 15 years. Yearly screening is stopped once you have quit smoking for at least 15 years or develop a health problem that would prevent you from having lung cancer treatment.  Clinical breast exam.** / Every year after age 43 years.  BRCA-related cancer risk assessment.** / For women who have family members with a BRCA-related cancer (breast, ovarian, tubal, or peritoneal cancers).  Mammogram.** / Every year beginning at age 88 years and continuing for as long as you are in good health. Consult with your health care provider.  Pap test.** / Every 3 years starting at age 76 years through age 29 or  67 years with a history of 3 consecutive normal Pap tests.  HPV screening.** / Every 3 years from ages 15 years through ages 21 to 69 years with a history of 3 consecutive normal Pap tests.  Fecal occult blood test (FOBT) of stool. / Every year beginning at age 53 years and continuing until age 89 years. You may not need to do this test if you get a colonoscopy every 10 years.  Flexible sigmoidoscopy or colonoscopy.** / Every 5 years for a flexible sigmoidoscopy or every 10 years for a colonoscopy beginning at age 70 years and continuing until age 41 years.  Hepatitis C blood test.** / For all people born from [birth year] through 1965 and any individual with known risks for hepatitis C.  Skin self-exam. / Monthly.  Influenza vaccine. / Every year.  Tetanus, diphtheria, and acellular pertussis (Tdap/Td) vaccine.** / Consult your health care provider. Pregnant women should receive 1 dose of Tdap vaccine during each pregnancy. 1 dose of Td every 10 years.  Varicella vaccine.** / Consult your health care provider. Pregnant females who do not have evidence of immunity should receive the first dose after pregnancy.  Zoster vaccine.** / 1 dose for adults aged 48 years or older.  Measles, mumps, rubella (MMR) vaccine.** / You need at least  1 dose of MMR if you were born in 1957 or later. You may also need a 2nd dose. For females of childbearing age, rubella immunity should be determined. If there is no evidence of immunity, females who are not pregnant should be vaccinated. If there is no evidence of immunity, females who are pregnant should delay immunization until after pregnancy.  Pneumococcal 13-valent conjugate (PCV13) vaccine.** / Consult your health care provider.  Pneumococcal polysaccharide (PPSV23) vaccine.** / 1 to 2 doses if you smoke cigarettes or if you have certain conditions.  Meningococcal vaccine.** / Consult your health care provider.  Hepatitis A vaccine.** / Consult your health care  provider.  Hepatitis B vaccine.** / Consult your health care provider.  Haemophilus influenzae type b (Hib) vaccine.** / Consult your health care provider. Ages 29 years and over  Blood pressure check.** / Every 1 to 2 years.  Lipid and cholesterol check.** / Every 5 years beginning at age 30 years.  Lung cancer screening. / Every year if you are aged 71-80 years and have a 30-pack-year history of smoking and currently smoke or have quit within the past 15 years. Yearly screening is stopped once you have quit smoking for at least 15 years or develop a health problem that would prevent you from having lung cancer treatment.  Clinical breast exam.** / Every year after age 23 years.  BRCA-related cancer risk assessment.** / For women who have family members with a BRCA-related cancer (breast, ovarian, tubal, or peritoneal cancers).  Mammogram.** / Every year beginning at age 70 years and continuing for as long as you are in good health. Consult with your health care provider.  Pap test.** / Every 3 years starting at age 105 years through age 73 or 57 years with 3 consecutive normal Pap tests. Testing can be stopped between 65 and 70 years with 3 consecutive normal Pap tests and no abnormal Pap or HPV tests in the past 10 years.  HPV screening.** / Every 3 years from ages 23 years through ages 8 or 12 years with a history of 3 consecutive normal Pap tests. Testing can be stopped between 65 and 70 years with 3 consecutive normal Pap tests and no abnormal Pap or HPV tests in the past 10 years.  Fecal occult blood test (FOBT) of stool. / Every year beginning at age 87 years and continuing until age 50 years. You may not need to do this test if you get a colonoscopy every 10 years.  Flexible sigmoidoscopy or colonoscopy.** / Every 5 years for a flexible sigmoidoscopy or every 10 years for a colonoscopy beginning at age 66 years and continuing until age 60 years.  Hepatitis C blood test.** / For  all people born from [birth year] through 1965 and any individual with known risks for hepatitis C.  Osteoporosis screening.** / A one-time screening for women ages 39 years and over and women at risk for fractures or osteoporosis.  Skin self-exam. / Monthly.  Influenza vaccine. / Every year.  Tetanus, diphtheria, and acellular pertussis (Tdap/Td) vaccine.** / 1 dose of Td every 10 years.  Varicella vaccine.** / Consult your health care provider.  Zoster vaccine.** / 1 dose for adults aged 65 years or older.  Pneumococcal 13-valent conjugate (PCV13) vaccine.** / Consult your health care provider.  Pneumococcal polysaccharide (PPSV23) vaccine.** / 1 dose for all adults aged 12 years and older.  Meningococcal vaccine.** / Consult your health care provider.  Hepatitis A vaccine.** / Consult your health care provider.  Hepatitis B vaccine.** / Consult your health care provider.  Haemophilus influenzae type b (Hib) vaccine.** / Consult your health care provider. ** Family history and personal history of risk and conditions may change your health care provider's recommendations. Document Released: 12/30/2001 Document Revised: 03/20/2014 Document Reviewed: 03/31/2011 [facility] Patient Information 2015 [place], [place]. This information is not intended to replace advice given to you by your health care provider. Make sure you discuss any questions you have with your health care provider.               Exercise for Strong Bones  Exercise is important to build and maintain strong bones / bone density.  There are 2 types of exercises that are important to building and maintaining strong bones:  Weight- bearing and muscle-stregthening.  Weight-bearing Exercises  These exercises include activities that make you move against gravity while staying upright. Weight-bearing exercises can be high-impact or low-impact.  High-impact weight-bearing exercises help build bones and keep them strong. If you have  broken a bone due to osteoporosis or are at risk of breaking a bone, you may need to avoid high-impact exercises. If you're not sure, you should check with your healthcare provider.  Examples of high-impact weight-bearing exercises are: Dancing  Doing high-impact aerobics  Hiking  Jogging/running  Jumping Rope  Stair climbing  Tennis  Low-impact weight-bearing exercises can also help keep bones strong and are a safe alternative if you cannot do high-impact exercises.   Examples of low-impact weight-bearing exercises are: Using elliptical training machines  Doing low-impact aerobics  Using stair-step machines  Fast walking on a treadmill or outside   Muscle-Strengthening Exercises These exercises include activities where you move your body, a weight or some other resistance against gravity. They are also known as resistance exercises and include: Lifting weights  Using elastic exercise bands  Using weight machines  Lifting your own body weight  Functional movements, such as standing and rising up on your toes  Yoga and Pilates can also improve strength, balance and flexibility. However, certain positions may not be safe for people with osteoporosis or those at increased risk of broken bones. For example, exercises that have you bend forward may increase the chance of breaking a bone in the spine.   Non-Impact Exercises There are other types of exercises that can help prevent falls.  Non-impact exercises can help you to improve balance, posture and how well you move in everyday activities. Some of these exercises include: Balance exercises that strengthen your legs and test your balance, such as Tai Chi, can decrease your risk of falls.  Posture exercises that improve your posture and reduce rounded or "sloping" shoulders can help you decrease the chance of breaking a bone, especially in the spine.  Functional exercises that improve how well you move can help you with everyday  activities and decrease your chance of falling and breaking a bone. For example, if you have trouble getting up from a chair or climbing stairs, you should do these activities as exercises.   **A physical therapist can teach you balance, posture and functional exercises. He/she can also help you learn which exercises are safe and appropriate for you.  [facility] has a physical therapy office in [place] in front of our office and referrals can be made for assessments and treatment as needed and strength and balance training.  If you would like to have an assessment with [facility] and our physical therapy team please let a nurse or provider know.

## 2015-02-19 ENCOUNTER — Ambulatory Visit (INDEPENDENT_AMBULATORY_CARE_PROVIDER_SITE_OTHER): Payer: Medicare Other | Admitting: Nurse Practitioner

## 2015-02-19 ENCOUNTER — Encounter: Payer: Self-pay | Admitting: Nurse Practitioner

## 2015-02-19 VITALS — BP 143/80 | HR 66 | Temp 96.8°F | Ht 63.0 in | Wt 152.0 lb

## 2015-02-19 DIAGNOSIS — I10 Essential (primary) hypertension: Secondary | ICD-10-CM | POA: Diagnosis not present

## 2015-02-19 DIAGNOSIS — E118 Type 2 diabetes mellitus with unspecified complications: Secondary | ICD-10-CM

## 2015-02-19 DIAGNOSIS — D649 Anemia, unspecified: Secondary | ICD-10-CM | POA: Diagnosis not present

## 2015-02-19 DIAGNOSIS — E785 Hyperlipidemia, unspecified: Secondary | ICD-10-CM

## 2015-02-19 LAB — POCT GLYCOSYLATED HEMOGLOBIN (HGB A1C): HEMOGLOBIN A1C: 6.2

## 2015-02-19 MED ORDER — FERROUS SULFATE 325 (65 FE) MG PO TBEC: 325.0000 mg | DELAYED_RELEASE_TABLET | Freq: Every day | ORAL | Status: DC

## 2015-02-19 MED ORDER — GABAPENTIN 100 MG PO CAPS: 100.0000 mg | ORAL_CAPSULE | Freq: Three times a day (TID) | ORAL | Status: DC

## 2015-02-19 MED ORDER — SIMVASTATIN 20 MG PO TABS: 20.0000 mg | ORAL_TABLET | Freq: Every day | ORAL | Status: DC

## 2015-02-19 MED ORDER — LISINOPRIL 5 MG PO TABS: 5.0000 mg | ORAL_TABLET | Freq: Every day | ORAL | Status: DC

## 2015-02-19 MED ORDER — METFORMIN HCL 500 MG PO TABS: 500.0000 mg | ORAL_TABLET | Freq: Two times a day (BID) | ORAL | Status: DC

## 2015-02-19 NOTE — Progress Notes (Signed)
Subjective:    Patient ID: Lindsay Villarreal, female    DOB: 1946/03/14, 69 y.o.   MRN: [id number]  Patient here today for follow up of chronic medical problems- No complaints today  Hyperlipidemia This is a chronic problem. The current episode started more than 1 year ago. The problem is controlled. Recent lipid tests were reviewed and are normal. Exacerbating diseases include diabetes. She has no history of hypothyroidism or obesity. Pertinent negatives include no shortness of breath. Current antihyperlipidemic treatment includes statins. The current treatment provides moderate improvement of lipids. There are no compliance problems.  Risk factors for coronary artery disease include diabetes mellitus, dyslipidemia, hypertension and post-menopausal.  Diabetes She presents for her follow-up diabetic visit. She has type 2 diabetes mellitus. No MedicAlert identification noted. Her disease course has been stable. Pertinent negatives for hypoglycemia include no headaches. There are no hypoglycemic complications. Symptoms are stable. Current diabetic treatment includes oral agent (monotherapy). Her weight is stable. She is following a diabetic diet. When asked about meal planning, she reported none. She has not had a previous visit with a dietitian. She rarely participates in exercise. Her breakfast blood glucose is taken between 9-10 am. Her breakfast blood glucose range is generally 110-130 mg/dl. Her overall blood glucose range is 110-130 mg/dl. An ACE inhibitor/angiotensin II receptor blocker is being taken. She does not see a podiatrist.Eye exam is not current.  Hypertension This is a chronic problem. The current episode started more than 1 year ago. The problem is controlled. Pertinent negatives include no headaches, neck pain, palpitations or shortness of breath. Risk factors for coronary artery disease include diabetes mellitus, dyslipidemia and post-menopausal state. Past treatments include ACE  inhibitors. The current treatment provides significant improvement. There are no compliance problems.   Anemia Currently on nuiron- was released by hematologist- just said for [place] to keep watch on labs. Pt denies fatigue. Occasionally constipation.  Osteoporosis Prolia every 6 months- no side effects Diabetic neuropathy neurotin really helping a lot with the burning sensation bil feet    Review of Systems  Constitutional: Negative.   HENT: Negative.   Respiratory: Negative for shortness of breath.   Cardiovascular: Negative for palpitations.  Musculoskeletal: Negative for neck pain.  Neurological: Negative for headaches.  All other systems reviewed and are negative.      Objective:   Physical Exam  Constitutional: She is oriented to person, place, and time. She appears well-developed and well-nourished.  HENT:  Nose: Nose normal.  Mouth/Throat: Oropharynx is clear and moist.  Eyes: EOM are normal.  Neck: Trachea normal, normal range of motion and full passive range of motion without pain. Neck supple. No JVD present. Carotid bruit is not present. No thyromegaly present.  Cardiovascular: Normal rate, regular rhythm, normal heart sounds and intact distal pulses.  Exam reveals no gallop and no friction rub.   No murmur heard. Pulmonary/Chest: Effort normal and breath sounds normal.  Abdominal: Soft. Bowel sounds are normal. She exhibits no distension and no mass. There is no tenderness.  Musculoskeletal: Normal range of motion.  Lymphadenopathy:    She has no cervical adenopathy.  Neurological: She is alert and oriented to person, place, and time. She has normal reflexes.  Skin: Skin is warm and dry.  Psychiatric: She has a normal mood and affect. Her behavior is normal. Judgment and thought content normal.   BP 143/80 mmHg  Pulse 66  Temp(Src) 96.8 F (36 C) (Oral)  Ht '5\' 3"'  (1.6 m)  Wt 152 lb (68.947  kg)  BMI 26.93 kg/m2   Results for orders placed or performed in visit  on 02/19/15  POCT glycosylated hemoglobin (Hb A1C)  Result Value Ref Range   Hemoglobin A1C 6.2%         Assessment & Plan:   1. Hyperlipidemia Low fta diet - NMR, lipoprofile - simvastatin (ZOCOR) 20 MG tablet; Take 1 tablet (20 mg total) by mouth daily.  Dispense: 30 tablet; Refill: 5  2. Essential hypertension Do not add salt to diet - CMP14+EGFR - lisinopril (PRINIVIL,ZESTRIL) 5 MG tablet; Take 1 tablet (5 mg total) by mouth daily.  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  Dispense: 90 capsule; Refill: 5 - metFORMIN (GLUCOPHAGE) 500 MG tablet; Take 1 tablet (500 mg total) by mouth 2 (two) times daily.  Dispense: 60 tablet; Refill: 5  4. Anemia, unspecified anemia type - ferrous sulfate 325 (65 FE) MG EC tablet; Take 1 tablet (325 mg total) by mouth daily.  Dispense: 30 tablet; Refill: 5    Labs pending Health maintenance reviewed Diet and exercise encouraged Continue all meds Follow up  In 3 months   [place], FNP

## 2015-02-19 NOTE — Patient Instructions (Signed)
Fat and Cholesterol Control Diet Fat and cholesterol levels in your blood and organs are influenced by your diet. High levels of fat and cholesterol may lead to diseases of the heart, small and large blood vessels, gallbladder, liver, and pancreas. CONTROLLING FAT AND CHOLESTEROL WITH DIET Although exercise and lifestyle factors are important, your diet is key. That is because certain foods are known to raise cholesterol and others to lower it. The goal is to balance foods for their effect on cholesterol and more importantly, to replace saturated and trans fat with other types of fat, such as monounsaturated fat, polyunsaturated fat, and omega-3 fatty acids. On average, a person should consume no more than 15 to 17 g of saturated fat daily. Saturated and trans fats are considered "bad" fats, and they will raise LDL cholesterol. Saturated fats are primarily found in animal products such as meats, butter, and cream. However, that does not mean you need to give up all your favorite foods. Today, there are good tasting, low-fat, low-cholesterol substitutes for most of the things you like to eat. Choose low-fat or nonfat alternatives. Choose round or loin cuts of red meat. These types of cuts are lowest in fat and cholesterol. Chicken (without the skin), fish, veal, and ground turkey breast are great choices. Eliminate fatty meats, such as hot dogs and salami. Even shellfish have little or no saturated fat. Have a 3 oz (85 g) portion when you eat lean meat, poultry, or fish. Trans fats are also called "partially hydrogenated oils." They are oils that have been scientifically manipulated so that they are solid at room temperature resulting in a longer shelf life and improved taste and texture of foods in which they are added. Trans fats are found in stick margarine, some tub margarines, cookies, crackers, and baked goods.  When baking and cooking, oils are a great substitute for butter. The monounsaturated oils are  especially beneficial since it is believed they lower LDL and raise HDL. The oils you should avoid entirely are saturated tropical oils, such as coconut and palm.  Remember to eat a lot from food groups that are naturally free of saturated and trans fat, including fish, fruit, vegetables, beans, grains (barley, rice, couscous, bulgur wheat), and pasta (without cream sauces).  IDENTIFYING FOODS THAT LOWER FAT AND CHOLESTEROL  Soluble fiber may lower your cholesterol. This type of fiber is found in fruits such as apples, vegetables such as broccoli, potatoes, and carrots, legumes such as beans, peas, and lentils, and grains such as barley. Foods fortified with plant sterols (phytosterol) may also lower cholesterol. You should eat at least 2 g per day of these foods for a cholesterol lowering effect.  Read package labels to identify low-saturated fats, trans fat free, and low-fat foods at the supermarket. Select cheeses that have only 2 to 3 g saturated fat per ounce. Use a heart-healthy tub margarine that is free of trans fats or partially hydrogenated oil. When buying baked goods (cookies, crackers), avoid partially hydrogenated oils. Breads and muffins should be made from whole grains (whole-wheat or whole oat flour, instead of "flour" or "enriched flour"). Buy non-creamy canned soups with reduced salt and no added fats.  FOOD PREPARATION TECHNIQUES  Never deep-fry. If you must fry, either stir-fry, which uses very little fat, or use non-stick cooking sprays. When possible, broil, bake, or roast meats, and steam vegetables. Instead of putting butter or margarine on vegetables, use lemon and herbs, applesauce, and cinnamon (for squash and sweet potatoes). Use nonfat   yogurt, salsa, and low-fat dressings for salads.  LOW-SATURATED FAT / LOW-FAT FOOD SUBSTITUTES Meats / Saturated Fat (g)  Avoid: Steak, marbled (3 oz/85 g) / 11 g  Choose: Steak, lean (3 oz/85 g) / 4 g  Avoid: Hamburger (3 oz/85 g) / 7  g  Choose: Hamburger, lean (3 oz/85 g) / 5 g  Avoid: Ham (3 oz/85 g) / 6 g  Choose: Ham, lean cut (3 oz/85 g) / 2.4 g  Avoid: Chicken, with skin, dark meat (3 oz/85 g) / 4 g  Choose: Chicken, skin removed, dark meat (3 oz/85 g) / 2 g  Avoid: Chicken, with skin, light meat (3 oz/85 g) / 2.5 g  Choose: Chicken, skin removed, light meat (3 oz/85 g) / 1 g Dairy / Saturated Fat (g)  Avoid: Whole milk (1 cup) / 5 g  Choose: Low-fat milk, 2% (1 cup) / 3 g  Choose: Low-fat milk, 1% (1 cup) / 1.5 g  Choose: Skim milk (1 cup) / 0.3 g  Avoid: Hard cheese (1 oz/28 g) / 6 g  Choose: Skim milk cheese (1 oz/28 g) / 2 to 3 g  Avoid: Cottage cheese, 4% fat (1 cup) / 6.5 g  Choose: Low-fat cottage cheese, 1% fat (1 cup) / 1.5 g  Avoid: Ice cream (1 cup) / 9 g  Choose: Sherbet (1 cup) / 2.5 g  Choose: Nonfat frozen yogurt (1 cup) / 0.3 g  Choose: Frozen fruit bar / trace  Avoid: Whipped cream (1 tbs) / 3.5 g  Choose: Nondairy whipped topping (1 tbs) / 1 g Condiments / Saturated Fat (g)  Avoid: Mayonnaise (1 tbs) / 2 g  Choose: Low-fat mayonnaise (1 tbs) / 1 g  Avoid: Butter (1 tbs) / 7 g  Choose: Extra light margarine (1 tbs) / 1 g  Avoid: Coconut oil (1 tbs) / 11.8 g  Choose: Olive oil (1 tbs) / 1.8 g  Choose: Corn oil (1 tbs) / 1.7 g  Choose: Safflower oil (1 tbs) / 1.2 g  Choose: Sunflower oil (1 tbs) / 1.4 g  Choose: Soybean oil (1 tbs) / 2.4 g  Choose: Canola oil (1 tbs) / 1 g Document Released: 11/03/2005 Document Revised: 02/28/2013 Document Reviewed: 02/01/2014 [employer] Patient Information 2015 [employer]. This information is not intended to replace advice given to you by your health care provider. Make sure you discuss any questions you have with your health care provider.  

## 2015-02-20 LAB — CMP14+EGFR
ALK PHOS: 64 IU/L (ref 39–117)
ALT: 10 IU/L (ref 0–32)
AST: 11 IU/L (ref 0–40)
Albumin/Globulin Ratio: 2.3 (ref 1.1–2.5)
Albumin: 4.5 g/dL (ref 3.6–4.8)
BILIRUBIN TOTAL: 0.3 mg/dL (ref 0.0–1.2)
BUN/Creatinine Ratio: 27 — ABNORMAL HIGH (ref 11–26)
BUN: 15 mg/dL (ref 8–27)
CALCIUM: 9.2 mg/dL (ref 8.7–10.3)
CO2: 24 mmol/L (ref 18–29)
CREATININE: 0.56 mg/dL — AB (ref 0.57–1.00)
Chloride: 102 mmol/L (ref 97–108)
GFR calc Af Amer: 111 mL/min/{1.73_m2} (ref >59)
GFR calc non Af Amer: 96 mL/min/{1.73_m2} (ref >59)
Globulin, Total: 2 g/dL (ref 1.5–4.5)
Glucose: 113 mg/dL — ABNORMAL HIGH (ref 65–99)
Potassium: 4.7 mmol/L (ref 3.5–5.2)
SODIUM: 140 mmol/L (ref 134–144)
Total Protein: 6.5 g/dL (ref 6.0–8.5)

## 2015-02-20 LAB — NMR, LIPOPROFILE
Cholesterol: 115 mg/dL (ref 100–199)
HDL Cholesterol by NMR: 35 mg/dL — ABNORMAL LOW (ref >39)
HDL PARTICLE NUMBER: 33 umol/L (ref >=30.5)
LDL Particle Number: 616 nmol/L (ref <1000)
LDL SIZE: 19.9 nm (ref >20.5)
LDL-C: 44 mg/dL (ref 0–99)
LP-IR Score: 76 — ABNORMAL HIGH (ref <=45)
SMALL LDL PARTICLE NUMBER: 420 nmol/L (ref <=527)
TRIGLYCERIDES BY NMR: 182 mg/dL — AB (ref 0–149)

## 2015-02-26 ENCOUNTER — Encounter: Payer: Self-pay | Admitting: Internal Medicine

## 2015-03-07 NOTE — Telephone Encounter (Signed)
error 

## 2015-03-12 DIAGNOSIS — Z1231 Encounter for screening mammogram for malignant neoplasm of breast: Secondary | ICD-10-CM | POA: Diagnosis not present

## 2015-04-11 ENCOUNTER — Encounter: Payer: Self-pay | Admitting: Family Medicine

## 2015-04-18 ENCOUNTER — Ambulatory Visit (AMBULATORY_SURGERY_CENTER): Payer: Self-pay

## 2015-04-18 VITALS — Ht 64.0 in | Wt 153.8 lb

## 2015-04-18 DIAGNOSIS — Z860100 Personal history of colon polyps, unspecified: Secondary | ICD-10-CM

## 2015-04-18 NOTE — Progress Notes (Signed)
No allergies to eggs or soy No diet/weight loss meds No home oxygen No past problems with anesthesia  No email 

## 2015-04-20 ENCOUNTER — Encounter: Payer: Self-pay | Admitting: *Deleted

## 2015-04-30 ENCOUNTER — Ambulatory Visit (AMBULATORY_SURGERY_CENTER): Payer: Medicare Other | Admitting: Internal Medicine

## 2015-04-30 ENCOUNTER — Encounter: Payer: Self-pay | Admitting: Internal Medicine

## 2015-04-30 VITALS — BP 132/80 | HR 65 | Temp 96.6°F | Resp 16 | Ht 64.0 in | Wt 153.0 lb

## 2015-04-30 DIAGNOSIS — D649 Anemia, unspecified: Secondary | ICD-10-CM | POA: Diagnosis not present

## 2015-04-30 DIAGNOSIS — Z86010 Personal history of colonic polyps: Secondary | ICD-10-CM | POA: Diagnosis not present

## 2015-04-30 DIAGNOSIS — I10 Essential (primary) hypertension: Secondary | ICD-10-CM | POA: Diagnosis not present

## 2015-04-30 DIAGNOSIS — E119 Type 2 diabetes mellitus without complications: Secondary | ICD-10-CM | POA: Diagnosis not present

## 2015-04-30 HISTORY — PX: COLONOSCOPY: SHX174

## 2015-04-30 MED ORDER — SODIUM CHLORIDE 0.9 % IV SOLN: 500.0000 mL | INTRAVENOUS | Status: DC

## 2015-04-30 NOTE — Patient Instructions (Signed)
YOU HAD AN ENDOSCOPIC PROCEDURE TODAY AT [facility]:   Refer to the procedure report that was given to you for any specific questions about what was found during the examination.  If the procedure report does not answer your questions, please call your gastroenterologist to clarify.  If you requested that your care partner not be given the details of your procedure findings, then the procedure report has been included in a sealed envelope for you to review at your convenience later.  YOU SHOULD EXPECT: Some feelings of bloating in the abdomen. Passage of more gas than usual.  Walking can help get rid of the air that was put into your GI tract during the procedure and reduce the bloating. If you had a lower endoscopy (such as a colonoscopy or flexible sigmoidoscopy) you may notice spotting of blood in your stool or on the toilet paper. If you underwent a bowel prep for your procedure, you may not have a normal bowel movement for a few days.  Please Note:  You might notice some irritation and congestion in your nose or some drainage.  This is from the oxygen used during your procedure.  There is no need for concern and it should clear up in a day or so.  SYMPTOMS TO REPORT IMMEDIATELY:   Following lower endoscopy (colonoscopy or flexible sigmoidoscopy):  Excessive amounts of blood in the stool  Significant tenderness or worsening of abdominal pains  Swelling of the abdomen that is new, acute  Fever of 100F or higher  For urgent or emergent issues, a gastroenterologist can be reached at any hour by calling [phone].   DIET: Your first meal following the procedure should be a small meal and then it is ok to progress to your normal diet. Heavy or fried foods are harder to digest and may make you feel nauseous or bloated.  Likewise, meals heavy in dairy and vegetables can increase bloating.  Drink plenty of fluids but you should avoid alcoholic beverages for 24  hours.  ACTIVITY:  You should plan to take it easy for the rest of today and you should NOT DRIVE or use heavy machinery until tomorrow (because of the sedation medicines used during the test).    FOLLOW UP: Our staff will call the number listed on your records the next business day following your procedure to check on you and address any questions or concerns that you may have regarding the information given to you following your procedure. If we do not reach you, we will leave a message.  However, if you are feeling well and you are not experiencing any problems, there is no need to return our call.  We will assume that you have returned to your regular daily activities without incident.  If any biopsies were taken you will be contacted by phone or by letter within the next 1-3 weeks.  Please call us at [phone] if you have not heard about the biopsies in 3 weeks.    SIGNATURES/CONFIDENTIALITY: You and/or your care partner have signed paperwork which will be entered into your electronic medical record.  These signatures attest to the fact that that the information above on your After Visit Summary has been reviewed and is understood.  Full responsibility of the confidentiality of this discharge information lies with you and/or your care-partner.  Diverticulosis,high fiber diet-handouts given  Repeat colonoscopy in 5 years-2021.

## 2015-04-30 NOTE — Progress Notes (Signed)
To recovery, report to [place], RN, VSS. 

## 2015-04-30 NOTE — Op Note (Signed)
[facility]  [address] [employer] [address] [place], [address]   COLONOSCOPY PROCEDURE REPORT  PATIENT: Lindsay Villarreal, Lindsay Villarreal  MR#: [id number] BIRTHDATE: Oct 09, 1946 , 69  yrs. old GENDER: female ENDOSCOPIST: Eustace Quail, MD REFERRED [id number] Program Recall PROCEDURE DATE:  04/30/2015 PROCEDURE:   Colonoscopy, surveillance First Screening Colonoscopy - Avg.  risk and is 50 yrs.  old or older - No.  Prior Negative Screening - Now for repeat screening. N/A  History of Adenoma - Now for follow-up colonoscopy & has been > or = to 3 yrs.  Yes hx of adenoma.  Has been 3 or more years since last colonoscopy.  Polyps removed today? No Recommend repeat exam, <10 yrs? Yes high risk ASA CLASS:   Class II INDICATIONS:Surveillance due to prior colonic neoplasia and PH Colon Adenoma. Index examination January 2013 with multiple adenomas (8 subcentimeter polyps). MEDICATIONS: Monitored anesthesia care and Propofol 200 mg IV  DESCRIPTION OF PROCEDURE:   After the risks benefits and alternatives of the procedure were thoroughly explained, informed consent was obtained.  The digital rectal exam revealed no abnormalities of the rectum.   The LB [id number]  endoscope was introduced through the anus and advanced to the cecum, which was identified by both the appendix and ileocecal valve. No adverse events experienced.   The quality of the prep was excellent. (MoviPrep was used)  The instrument was then slowly withdrawn as the colon was fully examined. Estimated blood loss is zero unless otherwise noted in this procedure report.    COLON FINDINGS: There was mild diverticulosis noted in the sigmoid colon.   A 2-3 cm pedunculated lipoma was found at the cecum.   The examination was otherwise normal.  Retroflexed views revealed internal hemorrhoids. The time to cecum = 1.9 Withdrawal time = 10.5   The scope was withdrawn and the procedure completed. COMPLICATIONS: There were no immediate  complications.  ENDOSCOPIC IMPRESSION: 1.   Mild diverticulosis was noted in the sigmoid colon 2.   Lipoma at the cecum 3.   The examination was otherwise normal  RECOMMENDATIONS: 1. Follow up colonoscopy in 5 years  (history of multiple adenomas)  eSigned:  Eustace Quail, MD 04/30/2015 8:21 AM   cc: [name], NP and The Patient

## 2015-05-01 ENCOUNTER — Telehealth: Payer: Self-pay | Admitting: *Deleted

## 2015-05-01 NOTE — Telephone Encounter (Signed)
  Follow up Call-  Call back number 04/30/2015  Post procedure Call Back phone  # [phone] hm   Permission to leave phone message Yes     Patient questions:  Do you have a fever, pain , or abdominal swelling? No. Pain Score  0 *  Have you tolerated food without any problems? Yes.    Have you been able to return to your normal activities? Yes.    Do you have any questions about your discharge instructions: Diet   No. Medications  No. Follow up visit  No.  Do you have questions or concerns about your Care? No.  Actions: * If pain score is 4 or above: No action needed, pain <4.

## 2015-05-04 ENCOUNTER — Encounter: Payer: Self-pay | Admitting: Internal Medicine

## 2015-06-05 DIAGNOSIS — D225 Melanocytic nevi of trunk: Secondary | ICD-10-CM | POA: Diagnosis not present

## 2015-06-05 DIAGNOSIS — L821 Other seborrheic keratosis: Secondary | ICD-10-CM | POA: Diagnosis not present

## 2015-06-05 DIAGNOSIS — L814 Other melanin hyperpigmentation: Secondary | ICD-10-CM | POA: Diagnosis not present

## 2015-07-09 ENCOUNTER — Other Ambulatory Visit: Payer: Self-pay | Admitting: Pharmacist

## 2015-07-09 MED ORDER — DENOSUMAB 60 MG/ML ~~LOC~~ SOLN: 60.0000 mg | SUBCUTANEOUS | Status: DC

## 2015-07-17 ENCOUNTER — Encounter: Payer: Self-pay | Admitting: Nurse Practitioner

## 2015-07-17 ENCOUNTER — Ambulatory Visit (INDEPENDENT_AMBULATORY_CARE_PROVIDER_SITE_OTHER): Payer: Medicare Other | Admitting: Nurse Practitioner

## 2015-07-17 ENCOUNTER — Ambulatory Visit (INDEPENDENT_AMBULATORY_CARE_PROVIDER_SITE_OTHER): Payer: Medicare Other

## 2015-07-17 VITALS — BP 128/78 | HR 70 | Temp 97.0°F | Ht 64.0 in | Wt 150.0 lb

## 2015-07-17 DIAGNOSIS — E1143 Type 2 diabetes mellitus with diabetic autonomic (poly)neuropathy: Secondary | ICD-10-CM

## 2015-07-17 DIAGNOSIS — D649 Anemia, unspecified: Secondary | ICD-10-CM

## 2015-07-17 DIAGNOSIS — Z720 Tobacco use: Secondary | ICD-10-CM

## 2015-07-17 DIAGNOSIS — E785 Hyperlipidemia, unspecified: Secondary | ICD-10-CM

## 2015-07-17 DIAGNOSIS — E1142 Type 2 diabetes mellitus with diabetic polyneuropathy: Secondary | ICD-10-CM | POA: Diagnosis not present

## 2015-07-17 DIAGNOSIS — I10 Essential (primary) hypertension: Secondary | ICD-10-CM

## 2015-07-17 DIAGNOSIS — F17200 Nicotine dependence, unspecified, uncomplicated: Secondary | ICD-10-CM

## 2015-07-17 LAB — POCT GLYCOSYLATED HEMOGLOBIN (HGB A1C): HEMOGLOBIN A1C: 6.3

## 2015-07-17 MED ORDER — GABAPENTIN 100 MG PO CAPS: 100.0000 mg | ORAL_CAPSULE | Freq: Three times a day (TID) | ORAL | Status: DC

## 2015-07-17 MED ORDER — SIMVASTATIN 20 MG PO TABS: 20.0000 mg | ORAL_TABLET | Freq: Every day | ORAL | Status: DC

## 2015-07-17 MED ORDER — FERROUS SULFATE 325 (65 FE) MG PO TBEC: 325.0000 mg | DELAYED_RELEASE_TABLET | Freq: Every day | ORAL | Status: DC

## 2015-07-17 MED ORDER — METFORMIN HCL 500 MG PO TABS: 500.0000 mg | ORAL_TABLET | Freq: Two times a day (BID) | ORAL | Status: DC

## 2015-07-17 MED ORDER — LISINOPRIL 5 MG PO TABS: 5.0000 mg | ORAL_TABLET | Freq: Every day | ORAL | Status: DC

## 2015-07-17 NOTE — Patient Instructions (Signed)
Diabetes and Foot Care Diabetes may cause you to have problems because of poor blood supply (circulation) to your feet and legs. This may cause the skin on your feet to become thinner, break easier, and heal more slowly. Your skin may become dry, and the skin may peel and crack. You may also have nerve damage in your legs and feet causing decreased feeling in them. You may not notice minor injuries to your feet that could lead to infections or more serious problems. Taking care of your feet is one of the most important things you can do for yourself.  HOME CARE INSTRUCTIONS  Wear shoes at all times, even in the house. Do not go barefoot. Bare feet are easily injured.  Check your feet daily for blisters, cuts, and redness. If you cannot see the bottom of your feet, use a mirror or ask someone for help.  Wash your feet with warm water (do not use hot water) and mild soap. Then pat your feet and the areas between your toes until they are completely dry. Do not soak your feet as this can dry your skin.  Apply a moisturizing lotion or petroleum jelly (that does not contain alcohol and is unscented) to the skin on your feet and to dry, brittle toenails. Do not apply lotion between your toes.  Trim your toenails straight across. Do not dig under them or around the cuticle. File the edges of your nails with an emery board or nail file.  Do not cut corns or calluses or try to remove them with medicine.  Wear clean socks or stockings every day. Make sure they are not too tight. Do not wear knee-high stockings since they may decrease blood flow to your legs.  Wear shoes that fit properly and have enough cushioning. To break in new shoes, wear them for just a few hours a day. This prevents you from injuring your feet. Always look in your shoes before you put them on to be sure there are no objects inside.  Do not cross your legs. This may decrease the blood flow to your feet.  If you find a minor scrape,  cut, or break in the skin on your feet, keep it and the skin around it clean and dry. These areas may be cleansed with mild soap and water. Do not cleanse the area with peroxide, alcohol, or iodine.  When you remove an adhesive bandage, be sure not to damage the skin around it.  If you have a wound, look at it several times a day to make sure it is healing.  Do not use heating pads or hot water bottles. They may burn your skin. If you have lost feeling in your feet or legs, you may not know it is happening until it is too late.  Make sure your health care provider performs a complete foot exam at least annually or more often if you have foot problems. Report any cuts, sores, or bruises to your health care provider immediately. SEEK MEDICAL CARE IF:   You have an injury that is not healing.  You have cuts or breaks in the skin.  You have an ingrown nail.  You notice redness on your legs or feet.  You feel burning or tingling in your legs or feet.  You have pain or cramps in your legs and feet.  Your legs or feet are numb.  Your feet always feel cold. SEEK IMMEDIATE MEDICAL CARE IF:   There is increasing redness,   swelling, or pain in or around a wound.  There is a red line that goes up your leg.  Pus is coming from a wound.  You develop a fever or as directed by your health care provider.  You notice a bad smell coming from an ulcer or wound. Document Released: 10/31/2000 Document Revised: 07/06/2013 Document Reviewed: 04/12/2013 Exit[employer] Patient Information 2015 [employer]. This information is not intended to replace advice given to you by your health care provider. Make sure you discuss any questions you have with your health care provider.  

## 2015-07-17 NOTE — Progress Notes (Signed)
Subjective:    Patient ID: Lindsay Villarreal, female    DOB: 14-May-1946, 69 y.o.   MRN: [id number]  Patient here today for follow up of chronic medical problems- No complaints today  Hyperlipidemia This is a chronic problem. The current episode started more than 1 year ago. The problem is controlled. Recent lipid tests were reviewed and are normal. Exacerbating diseases include diabetes. She has no history of hypothyroidism or obesity. Pertinent negatives include no chest pain, focal weakness or shortness of breath. Current antihyperlipidemic treatment includes statins. The current treatment provides moderate improvement of lipids. There are no compliance problems.  Risk factors for coronary artery disease include diabetes mellitus, dyslipidemia, hypertension and post-menopausal.  Diabetes She presents for her follow-up diabetic visit. She has type 2 diabetes mellitus. No MedicAlert identification noted. Her disease course has been stable. There are no hypoglycemic associated symptoms. Pertinent negatives for hypoglycemia include no headaches. Pertinent negatives for diabetes include no chest pain. There are no hypoglycemic complications. Symptoms are stable. Diabetic complications include nephropathy. Risk factors for coronary artery disease include diabetes mellitus, dyslipidemia and hypertension. Current diabetic treatment includes oral agent (monotherapy). Her weight is stable. She is following a diabetic diet. When asked about meal planning, she reported none. She has not had a previous visit with a dietitian. She rarely participates in exercise. Her breakfast blood glucose is taken between 9-10 am. Her breakfast blood glucose range is generally 110-130 mg/dl. Her overall blood glucose range is 90-110 mg/dl. An ACE inhibitor/angiotensin II receptor blocker is being taken. She does not see a podiatrist.Eye exam is current (Sept 16).  Hypertension This is a chronic problem. The current episode started more  than 1 year ago. The problem is controlled. Pertinent negatives include no chest pain, headaches, neck pain, palpitations or shortness of breath. There are no associated agents to hypertension. Risk factors for coronary artery disease include diabetes mellitus, dyslipidemia and post-menopausal state. Past treatments include ACE inhibitors. The current treatment provides significant improvement. There are no compliance problems.   Anemia Currently on nuiron- was released by hematologist- just said for [place] to keep watch on labs. Pt denies fatigue. Occasionally constipation.  Osteoporosis Prolia every 6 months- no side effects Diabetic neuropathy neurotin really helping a lot with the burning sensation bil feet    Review of Systems  Constitutional: Negative.   HENT: Negative.   Respiratory: Negative.  Negative for shortness of breath.   Cardiovascular: Negative for chest pain and palpitations.  Gastrointestinal: Negative.   Musculoskeletal: Negative.  Negative for neck pain.  Neurological: Negative.  Negative for focal weakness and headaches.  All other systems reviewed and are negative.      Objective:   Physical Exam  Constitutional: She is oriented to person, place, and time. She appears well-developed and well-nourished.  HENT:  Nose: Nose normal.  Mouth/Throat: Oropharynx is clear and moist.  Eyes: EOM are normal.  Neck: Trachea normal, normal range of motion and full passive range of motion without pain. Neck supple. No JVD present. Carotid bruit is not present. No thyromegaly present.  Cardiovascular: Normal rate, regular rhythm and intact distal pulses.  Exam reveals no gallop and no friction rub.   Murmur (3/6 systolic murmur) heard. Pulmonary/Chest: Effort normal and breath sounds normal.  Abdominal: Soft. Bowel sounds are normal. She exhibits no distension and no mass. There is no tenderness.  Musculoskeletal: Normal range of motion.  Lymphadenopathy:    She has no cervical  adenopathy.  Neurological: She is alert and  oriented to person, place, and time. She has normal reflexes.  Skin: Skin is warm and dry.  Psychiatric: She has a normal mood and affect. Her behavior is normal. Judgment and thought content normal.   BP 128/78 mmHg  Pulse 70  Temp(Src) 97 F (36.1 C) (Oral)  Ht _0  (1.626 m)  Wt 150 lb (68.04 kg)  BMI 25.73 kg/m2   Results for orders placed or performed in visit on 07/17/15  POCT glycosylated hemoglobin (Hb A1C)  Result Value Ref Range   Hemoglobin A1C 6.3    Chest x ray- no cardiopulmonary disease-Preliminary reading by Ronnald Collum, FNP  [facility]   EKG- Kerry Hough, FNP      Assessment & Plan:  1. Hyperlipidemia Low fat diet - Lipid panel - simvastatin (ZOCOR) 20 MG tablet; Take 1 tablet (20 mg total) by mouth daily.  Dispense: 30 tablet; Refill: 5  2. Essential hypertension Do not add salt to diet - CMP14+EGFR - lisinopril (PRINIVIL,ZESTRIL) 5 MG tablet; Take 1 tablet (5 mg total) by mouth daily.  Dispense: 30 tablet; Refill: 5 - EKG 12-Lead  3. Type 2 diabetes mellitus with diabetic polyneuropathy Low carb diet - POCT glycosylated hemoglobin (Hb A1C) - metFORMIN (GLUCOPHAGE) 500 MG tablet; Take 1 tablet (500 mg total) by mouth 2 (two) times daily.  Dispense: 60 tablet; Refill: 5  4. Smoker Smoking cessation encourgared - DG Chest 2 View; Future  5. Anemia, unspecified anemia type - ferrous sulfate 325 (65 FE) MG EC tablet; Take 1 tablet (325 mg total) by mouth daily.  Dispense: 30 tablet; Refill: 5  6. Diabetic autonomic neuropathy associated with type 2 diabetes mellitus Do not go barefooted - gabapentin (NEURONTIN) 100 MG capsule; Take 1 capsule (100 mg total) by mouth 3 (three) times daily.  Dispense: 90 capsule; Refill: 5    Labs pending Health maintenance reviewed Diet and exercise encouraged Continue all meds Follow up  In 3 month   [place], FNP

## 2015-07-18 LAB — CMP14+EGFR
ALK PHOS: 53 IU/L (ref 39–117)
ALT: 16 IU/L (ref 0–32)
AST: 14 IU/L (ref 0–40)
Albumin/Globulin Ratio: 2 (ref 1.1–2.5)
Albumin: 4.3 g/dL (ref 3.6–4.8)
BUN/Creatinine Ratio: 21 (ref 11–26)
BUN: 14 mg/dL (ref 8–27)
Bilirubin Total: 0.4 mg/dL (ref 0.0–1.2)
CALCIUM: 9.9 mg/dL (ref 8.7–10.3)
CO2: 26 mmol/L (ref 18–29)
Chloride: 100 mmol/L (ref 97–108)
Creatinine, Ser: 0.67 mg/dL (ref 0.57–1.00)
GFR, EST AFRICAN AMERICAN: 104 mL/min/{1.73_m2} (ref >59)
GFR, EST NON AFRICAN AMERICAN: 90 mL/min/{1.73_m2} (ref >59)
GLUCOSE: 109 mg/dL — AB (ref 65–99)
Globulin, Total: 2.1 g/dL (ref 1.5–4.5)
Potassium: 4.4 mmol/L (ref 3.5–5.2)
SODIUM: 141 mmol/L (ref 134–144)
TOTAL PROTEIN: 6.4 g/dL (ref 6.0–8.5)

## 2015-07-18 LAB — LIPID PANEL
CHOL/HDL RATIO: 2.9 ratio (ref 0.0–4.4)
CHOLESTEROL TOTAL: 117 mg/dL (ref 100–199)
HDL: 41 mg/dL (ref >39)
LDL Calculated: 43 mg/dL (ref 0–99)
Triglycerides: 163 mg/dL — ABNORMAL HIGH (ref 0–149)
VLDL Cholesterol Cal: 33 mg/dL (ref 5–40)

## 2015-07-31 DIAGNOSIS — Z961 Presence of intraocular lens: Secondary | ICD-10-CM | POA: Diagnosis not present

## 2015-07-31 DIAGNOSIS — H04123 Dry eye syndrome of bilateral lacrimal glands: Secondary | ICD-10-CM | POA: Diagnosis not present

## 2015-07-31 DIAGNOSIS — E119 Type 2 diabetes mellitus without complications: Secondary | ICD-10-CM | POA: Diagnosis not present

## 2015-07-31 LAB — HM DIABETES EYE EXAM

## 2015-08-07 ENCOUNTER — Other Ambulatory Visit: Payer: Self-pay | Admitting: Nurse Practitioner

## 2015-08-15 ENCOUNTER — Encounter: Payer: Self-pay | Admitting: Pharmacist

## 2015-08-15 ENCOUNTER — Ambulatory Visit (INDEPENDENT_AMBULATORY_CARE_PROVIDER_SITE_OTHER): Payer: Medicare Other | Admitting: Pharmacist

## 2015-08-15 DIAGNOSIS — M810 Age-related osteoporosis without current pathological fracture: Secondary | ICD-10-CM

## 2015-08-15 NOTE — Progress Notes (Signed)
Patient ID: Lindsay Villarreal, female   DOB: November 20, 1945, 69 y.o.   MRN: [id number]  Plan was for patient to pick up Prolia at [facility] but she did not.  When I called [facility] they had Rx but has been profiled because of cost. (over $400) Patient can get in office for zero.  Will order and administer as soon as product arrives.

## 2015-08-17 ENCOUNTER — Encounter: Payer: Self-pay | Admitting: *Deleted

## 2015-08-20 ENCOUNTER — Encounter: Payer: Self-pay | Admitting: Pharmacist

## 2015-08-20 ENCOUNTER — Ambulatory Visit (INDEPENDENT_AMBULATORY_CARE_PROVIDER_SITE_OTHER): Payer: Medicare Other | Admitting: Pharmacist

## 2015-08-20 VITALS — Ht 64.0 in | Wt 149.0 lb

## 2015-08-20 DIAGNOSIS — M810 Age-related osteoporosis without current pathological fracture: Secondary | ICD-10-CM

## 2015-08-20 MED ORDER — DENOSUMAB 60 MG/ML ~~LOC~~ SOLN
60.0000 mg | Freq: Once | SUBCUTANEOUS | Status: AC
  Administered 2015-08-20: 60 mg via SUBCUTANEOUS

## 2015-08-20 NOTE — Progress Notes (Signed)
Patient ID: Lindsay Villarreal, female   DOB: [birth year], 69 y.o.   MRN: [id number]   [facility] Current Height: Height: 5\' 4"  (162.6 cm)      Max Lifetime Height:  5' 5.5" Current Weight: Weight: 149 lb (67.586 kg)       Ethnicity:Caucasian    HPI: Patient with osteoporosis.  Started Prolia injections 12/22/2012.  She has tolerated well.    Back Pain?  Yes       Kyphosis?  No Prior fracture?  No Med(s) for Osteoporosis/Osteopenia:  Prolia 60mg  injected SQ q 6 months Med(s) previously tried for Osteoporosis/Osteopenia:  Fosamax and actonel both made bones and legs hurt                                                             PMH: Age at menopause:  50;s Hysterectomy?  Yes Oophorectomy?  No HRT? Yes - Former.  Type/duration: premarin for 5 years Steroid Use?  No Thyroid med?  No History of cancer?  No History of digestive disorders (ie Crohn's)?  No Current or previous eating disorders?  No Last Vitamin D Result:  30 (11/2012) Last GFR Result:  90 (07/18/2015)   FH/SH: Family history of osteoporosis?  Yes - mother Parent with history of hip fracture?  No Family history of breast cancer?  Yes - sister Exercise?  Yes - walking when its warm Smoking?  Yes   Alcohol?  No    Calcium Assessment Calcium Intake  # of servings/day  Calcium mg  Milk (8 oz) 1  x  300  = 300mg   Yogurt (4 oz) 0 x  200 = 0  Cheese (1 oz) 0 x  200 = 0  Other Calcium sources   250mg   Ca supplement 1000mg  = 1000mg    Estimated calcium intake per day 1550mg     DEXA Results Date of Test T-Score for AP Spine L1-L4 T-Score for Total Left Hip T-Score for Total Right Hip  01/10/2015 -2.3 -0.5 -0.5  12/01/2012 -2.9 -0.8 -0.6  07/25/2009 -2.8 -0.6 -0.8        Assessment: Osteoporosis with improved BMD since starting Prolia  Recommendations: 1.  Continue  Prolia 60mg  SQ q 6 months - administered today in office - next will be 02/18/2016 2.  continue calcium 1200mg  daily through supplementation  or diet.  3.  recommend weight bearing exercise - 30 minutes at least 4 days per week.   4.  Counseled and educated about fall risk and prevention. 5.  Offered influenza vaccine today but patient is leaving for vacation tomorrow and would like to postpone until after returns.  Appt made fro 08/29/15 at 9:45am  Recheck DEXA:  2 years  Time spent counseling patient:  15 minutes   Cherre Robins, PharmD, CPP

## 2015-08-30 ENCOUNTER — Ambulatory Visit: Payer: Self-pay

## 2015-10-04 ENCOUNTER — Ambulatory Visit (INDEPENDENT_AMBULATORY_CARE_PROVIDER_SITE_OTHER): Payer: Medicare Other

## 2015-10-04 DIAGNOSIS — Z23 Encounter for immunization: Secondary | ICD-10-CM | POA: Diagnosis not present

## 2015-10-16 IMAGING — CR DG CHEST 2V
2 series · 2 of 2 positions shown · non-contrast
Comparison: None in PACs

CLINICAL DATA: Current smoker, diabetes, hypertension,
hyperlipidemia.

EXAM:
CHEST  2 VIEW

[view not recorded (1 of 2)]
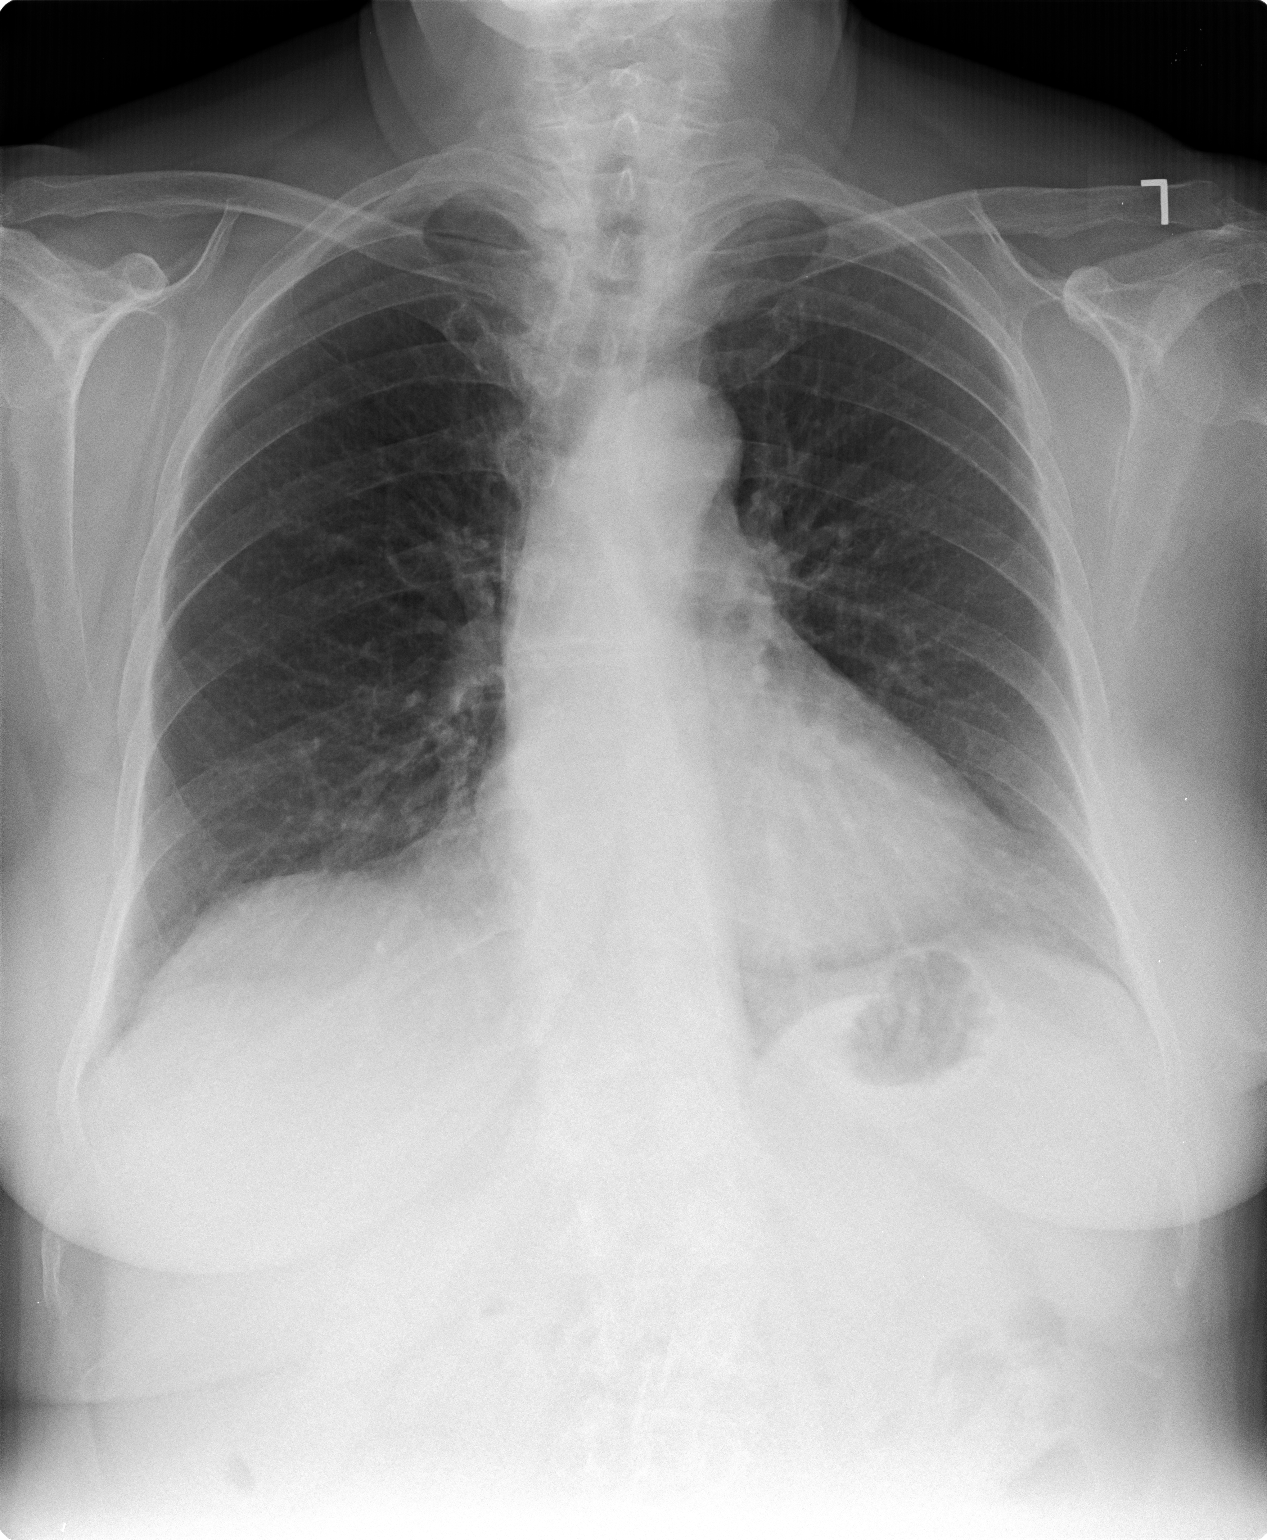

[view not recorded (2 of 2)]
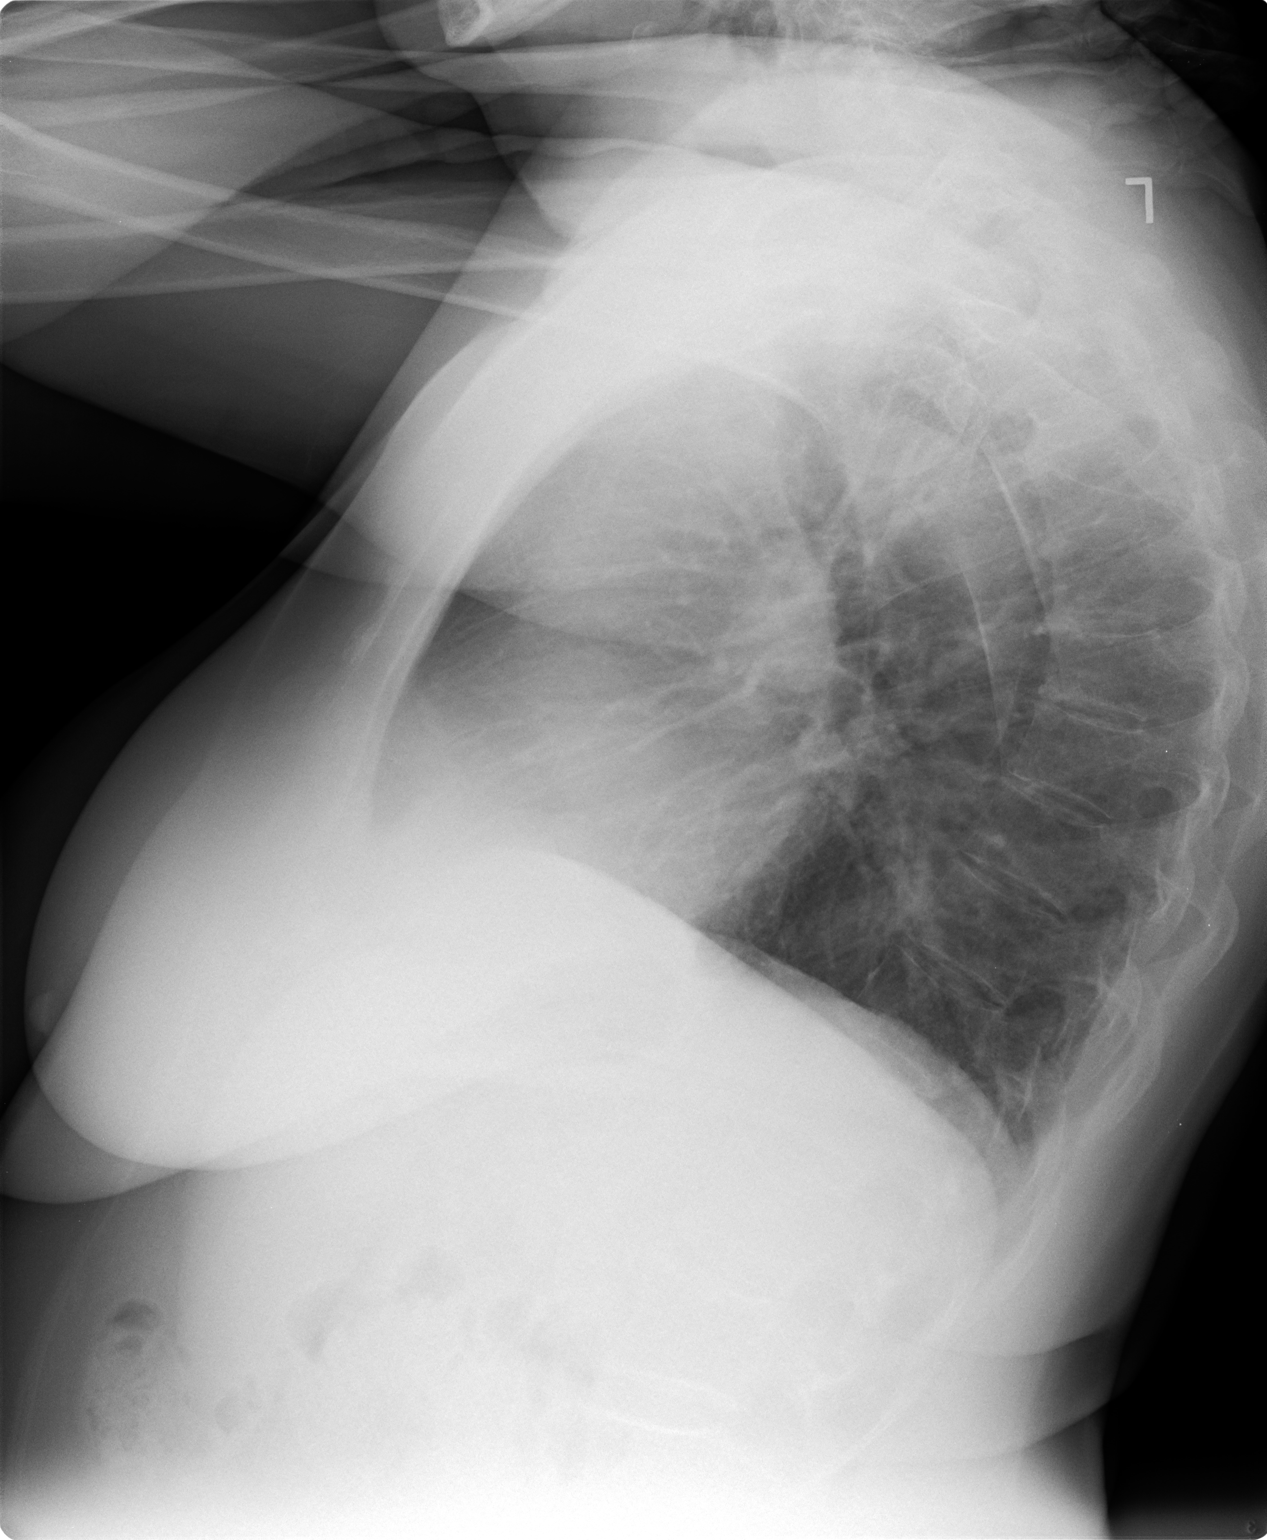

[2 of 2 positions shown; findings below may reference images not displayed]

FINDINGS: The lungs are adequately inflated. There is no focal infiltrate.
There is no pleural effusion. The heart is top normal in size. The
pulmonary vascularity is not engorged. The mediastinum is normal in
width. The bony thorax exhibits no acute abnormality. There is
gentle dextro curvature centered in the mid thoracic spine.
IMPRESSION: There is no active cardiopulmonary disease.

## 2015-12-13 ENCOUNTER — Telehealth: Payer: Self-pay | Admitting: Nurse Practitioner

## 2016-02-04 ENCOUNTER — Ambulatory Visit (INDEPENDENT_AMBULATORY_CARE_PROVIDER_SITE_OTHER): Payer: Medicare Other | Admitting: Nurse Practitioner

## 2016-02-04 ENCOUNTER — Encounter: Payer: Self-pay | Admitting: Nurse Practitioner

## 2016-02-04 VITALS — BP 121/70 | HR 68 | Temp 96.8°F | Ht 64.0 in | Wt 147.0 lb

## 2016-02-04 DIAGNOSIS — E785 Hyperlipidemia, unspecified: Secondary | ICD-10-CM

## 2016-02-04 DIAGNOSIS — M810 Age-related osteoporosis without current pathological fracture: Secondary | ICD-10-CM

## 2016-02-04 DIAGNOSIS — Z1159 Encounter for screening for other viral diseases: Secondary | ICD-10-CM

## 2016-02-04 DIAGNOSIS — E1143 Type 2 diabetes mellitus with diabetic autonomic (poly)neuropathy: Secondary | ICD-10-CM | POA: Diagnosis not present

## 2016-02-04 DIAGNOSIS — E118 Type 2 diabetes mellitus with unspecified complications: Secondary | ICD-10-CM | POA: Diagnosis not present

## 2016-02-04 DIAGNOSIS — D649 Anemia, unspecified: Secondary | ICD-10-CM | POA: Diagnosis not present

## 2016-02-04 DIAGNOSIS — I10 Essential (primary) hypertension: Secondary | ICD-10-CM | POA: Diagnosis not present

## 2016-02-04 DIAGNOSIS — E1142 Type 2 diabetes mellitus with diabetic polyneuropathy: Secondary | ICD-10-CM | POA: Diagnosis not present

## 2016-02-04 DIAGNOSIS — Z1212 Encounter for screening for malignant neoplasm of rectum: Secondary | ICD-10-CM

## 2016-02-04 LAB — BAYER DCA HB A1C WAIVED: HB A1C: 6.1 % (ref <7.0)

## 2016-02-04 MED ORDER — GABAPENTIN 100 MG PO CAPS: 100.0000 mg | ORAL_CAPSULE | Freq: Three times a day (TID) | ORAL | Status: DC

## 2016-02-04 MED ORDER — SIMVASTATIN 20 MG PO TABS: 20.0000 mg | ORAL_TABLET | Freq: Every day | ORAL | Status: DC

## 2016-02-04 MED ORDER — METFORMIN HCL 500 MG PO TABS: 500.0000 mg | ORAL_TABLET | Freq: Two times a day (BID) | ORAL | Status: DC

## 2016-02-04 MED ORDER — LISINOPRIL 5 MG PO TABS: 5.0000 mg | ORAL_TABLET | Freq: Every day | ORAL | Status: DC

## 2016-02-04 MED ORDER — FERROUS SULFATE 325 (65 FE) MG PO TBEC: 325.0000 mg | DELAYED_RELEASE_TABLET | Freq: Every day | ORAL | Status: DC

## 2016-02-04 NOTE — Progress Notes (Signed)
Subjective:    Patient ID: Lindsay Villarreal, female    DOB: 01-29-1946, 70 y.o.   MRN: [id number]   Patient here today for follow up of chronic medical problems- No complaints today  Current Outpatient Prescriptions on File Prior to Visit  Medication Sig Dispense Refill  . aspirin 81 MG tablet Take 81 mg by mouth daily.    [name] b complex vitamins capsule Take 1 capsule by mouth daily.    . Biotin 10 MG TABS Take 4 tablets by mouth daily.    . calcium carbonate (OS-CAL) 600 MG TABS tablet Take 600 mg by mouth daily with breakfast.    . denosumab (PROLIA) 60 MG/ML SOLN injection Inject 60 mg into the skin every 6 (six) months. Administer in upper arm, thigh, or abdomen 1 mL 0  . ferrous sulfate 325 (65 FE) MG EC tablet Take 1 tablet (325 mg total) by mouth daily. 30 tablet 5  . gabapentin (NEURONTIN) 100 MG capsule Take 1 capsule (100 mg total) by mouth 3 (three) times daily. 90 capsule 5  . lisinopril (PRINIVIL,ZESTRIL) 5 MG tablet Take 1 tablet (5 mg total) by mouth daily. 30 tablet 5  . metFORMIN (GLUCOPHAGE) 500 MG tablet Take 1 tablet (500 mg total) by mouth 2 (two) times daily. 60 tablet 5  . ONE TOUCH ULTRA TEST test strip USE TO CHECK GLUCOSE ONCE DAILY AS DIRECTED 100 each 1  . ONETOUCH DELICA LANCETS 93J MISC USE TO TEST GLUCOSE EVERY DAY OR AS DIRECTED 100 each 1  . simvastatin (ZOCOR) 20 MG tablet Take 1 tablet (20 mg total) by mouth daily. 30 tablet 5   No current facility-administered medications on file prior to visit.    Hyperlipidemia This is a chronic problem. The current episode started more than 1 year ago. The problem is controlled. Recent lipid tests were reviewed and are normal. Exacerbating diseases include diabetes. She has no history of hypothyroidism or obesity. Pertinent negatives include no chest pain, focal weakness or shortness of breath. Current antihyperlipidemic treatment includes statins. The current treatment provides moderate improvement of lipids. There are no  compliance problems.  Risk factors for coronary artery disease include diabetes mellitus, dyslipidemia, hypertension and post-menopausal.  Diabetes She presents for her follow-up diabetic visit. She has type 2 diabetes mellitus. No MedicAlert identification noted. Her disease course has been stable. There are no hypoglycemic associated symptoms. Pertinent negatives for hypoglycemia include no headaches. Pertinent negatives for diabetes include no chest pain. There are no hypoglycemic complications. Symptoms are stable. Diabetic complications include nephropathy. Risk factors for coronary artery disease include diabetes mellitus, dyslipidemia and hypertension. Current diabetic treatment includes oral agent (monotherapy). Her weight is stable. She is following a diabetic diet. When asked about meal planning, she reported none. She has not had a previous visit with a dietitian. She rarely participates in exercise. There is no change in her home blood glucose trend. Her breakfast blood glucose is taken between 9-10 am. Her breakfast blood glucose range is generally 110-130 mg/dl. Her overall blood glucose range is 90-110 mg/dl. An ACE inhibitor/angiotensin II receptor blocker is being taken. She does not see a podiatrist.Eye exam is current (Sept 16).  Hypertension This is a chronic problem. The current episode started more than 1 year ago. The problem is controlled. Pertinent negatives include no chest pain, headaches, neck pain, palpitations or shortness of breath. There are no associated agents to hypertension. Risk factors for coronary artery disease include diabetes mellitus, dyslipidemia and post-menopausal state. Past  treatments include ACE inhibitors. The current treatment provides significant improvement. There are no compliance problems.   Anemia Currently on nuiron- was released by hematologist- just said for [place] to keep watch on labs. Pt denies fatigue. Occasionally constipation.  Osteoporosis Prolia  every 6 months- no side effects Diabetic neuropathy neurotin really helping a lot with the burning sensation bil feet    Review of Systems  Constitutional: Negative.   HENT: Negative.   Eyes: Negative.   Respiratory: Negative.  Negative for shortness of breath.   Cardiovascular: Negative.  Negative for chest pain and palpitations.  Gastrointestinal: Negative.   Endocrine: Negative.   Genitourinary: Negative.   Musculoskeletal: Negative.  Negative for neck pain.  Skin: Negative.   Allergic/Immunologic: Negative.   Neurological: Negative.  Negative for focal weakness and headaches.  Hematological: Negative.   Psychiatric/Behavioral: Negative.   All other systems reviewed and are negative.      Objective:   Physical Exam  Constitutional: She is oriented to person, place, and time. Vital signs are normal. She appears well-developed and well-nourished.  HENT:  Right Ear: Hearing, tympanic membrane, external ear and ear canal normal.  Left Ear: Hearing, tympanic membrane, external ear and ear canal normal.  Nose: Nose normal.  Mouth/Throat: Uvula is midline, oropharynx is clear and moist and mucous membranes are normal.  Eyes: Conjunctivae and EOM are normal. Pupils are equal, round, and reactive to light.  Neck: Trachea normal, normal range of motion and full passive range of motion without pain. Neck supple. No JVD present. Carotid bruit is not present. No thyromegaly present.  Cardiovascular: Normal rate, regular rhythm and intact distal pulses.  Exam reveals no gallop and no friction rub.   Murmur (3/6 systolic murmur) heard. Pulmonary/Chest: Effort normal and breath sounds normal.  Abdominal: Soft. Normal appearance and bowel sounds are normal. She exhibits no distension and no mass. There is no tenderness.  Musculoskeletal: Normal range of motion.  Lymphadenopathy:    She has no cervical adenopathy.  Neurological: She is alert and oriented to person, place, and time. She has  normal reflexes.  Skin: Skin is warm, dry and intact.  Psychiatric: She has a normal mood and affect. Her speech is normal and behavior is normal. Judgment and thought content normal. Cognition and memory are normal.   BP 121/70 mmHg  Pulse 68  Temp(Src) 96.8 F (36 C) (Oral)  Ht '5\' 4"'  (1.626 m)  Wt 147 lb (66.679 kg)  BMI 25.22 kg/m2    A1C 6.1 Assessment & Plan:  1. Hyperlipidemia Continue low fat diet - Lipid panel - simvastatin (ZOCOR) 20 MG tablet; Take 1 tablet (20 mg total) by mouth daily.  Dispense: 30 tablet; Refill: 5  2. Essential hypertension No salt added to meals - CMP14+EGFR - lisinopril (PRINIVIL,ZESTRIL) 5 MG tablet; Take 1 tablet (5 mg total) by mouth daily.  Dispense: 30 tablet; Refill: 5  3. Type 2 diabetes mellitus with diabetic polyneuropathy, without long-term current use of insulin (HCC) Low carb diet - Bayer DCA Hb A1c Waived - Microalbumin / creatinine urine ratio - metFORMIN (GLUCOPHAGE) 500 MG tablet; Take 1 tablet (500 mg total) by mouth 2 (two) times daily.  Dispense: 60 tablet; Refill: 5  4. Diabetic autonomic neuropathy associated with type 2 diabetes mellitus (HCC) - gabapentin (NEURONTIN) 100 MG capsule; Take 1 capsule (100 mg total) by mouth 3 (three) times daily.  Dispense: 90 capsule; Refill: 5  5. Osteoporosis  6. Anemia, unspecified anemia type Eat foods high in  iron - ferrous sulfate 325 (65 FE) MG EC tablet; Take 1 tablet (325 mg total) by mouth daily.  Dispense: 30 tablet; Refill: 5  7. Type 2 diabetes mellitus with complication, without long-term current use of insulin (HCC) Cont low carb diet   8. Screening for malignant neoplasm of the rectum - Fecal occult blood, imunochemical; Future  9. Need for hepatitis C screening test - Hepatitis C antibody   Continue all meds Labs pending Health Maintenance reviewed Diet and exercise encouraged RTO 3 months  Rosalio Loud FNP Student Lindsay Villarreal Done, [place]

## 2016-02-04 NOTE — Patient Instructions (Signed)

## 2016-02-05 LAB — CMP14+EGFR
ALT: 13 IU/L (ref 0–32)
AST: 17 IU/L (ref 0–40)
Albumin/Globulin Ratio: 1.9 (ref 1.2–2.2)
Albumin: 4.3 g/dL (ref 3.6–4.8)
Alkaline Phosphatase: 53 IU/L (ref 39–117)
BUN/Creatinine Ratio: 19 (ref 11–26)
BUN: 12 mg/dL (ref 8–27)
Bilirubin Total: 0.3 mg/dL (ref 0.0–1.2)
CALCIUM: 9.7 mg/dL (ref 8.7–10.3)
CO2: 26 mmol/L (ref 18–29)
CREATININE: 0.64 mg/dL (ref 0.57–1.00)
Chloride: 98 mmol/L (ref 96–106)
GFR, EST AFRICAN AMERICAN: 105 mL/min/{1.73_m2} (ref >59)
GFR, EST NON AFRICAN AMERICAN: 91 mL/min/{1.73_m2} (ref >59)
GLUCOSE: 127 mg/dL — AB (ref 65–99)
Globulin, Total: 2.3 g/dL (ref 1.5–4.5)
Potassium: 4.2 mmol/L (ref 3.5–5.2)
Sodium: 142 mmol/L (ref 134–144)
TOTAL PROTEIN: 6.6 g/dL (ref 6.0–8.5)

## 2016-02-05 LAB — LIPID PANEL
CHOL/HDL RATIO: 3.2 ratio (ref 0.0–4.4)
CHOLESTEROL TOTAL: 127 mg/dL (ref 100–199)
HDL: 40 mg/dL (ref >39)
LDL CALC: 56 mg/dL (ref 0–99)
TRIGLYCERIDES: 156 mg/dL — AB (ref 0–149)
VLDL Cholesterol Cal: 31 mg/dL (ref 5–40)

## 2016-02-05 LAB — HCV ANTIBODY: Hep C Virus Ab: 0.1 s/co ratio (ref 0.0–0.9)

## 2016-02-13 ENCOUNTER — Other Ambulatory Visit: Payer: Self-pay | Admitting: Pharmacist

## 2016-02-13 ENCOUNTER — Telehealth: Payer: Self-pay | Admitting: Pharmacist

## 2016-02-13 DIAGNOSIS — M810 Age-related osteoporosis without current pathological fracture: Secondary | ICD-10-CM

## 2016-02-13 MED ORDER — DENOSUMAB 60 MG/ML ~~LOC~~ SOLN: 60.0000 mg | SUBCUTANEOUS | Status: DC

## 2016-02-13 NOTE — Telephone Encounter (Signed)
Cancelled Rx to [facility] because was over $500.  Will be covered through physician buy and bill at 100% once deductible met.  Patient notified and prolia ordered from [place].

## 2016-02-14 ENCOUNTER — Ambulatory Visit: Payer: Medicare Other | Admitting: Pharmacist

## 2016-02-18 ENCOUNTER — Ambulatory Visit: Payer: Self-pay | Admitting: Pharmacist

## 2016-02-18 ENCOUNTER — Encounter: Payer: Self-pay | Admitting: Pharmacist

## 2016-02-18 ENCOUNTER — Ambulatory Visit (INDEPENDENT_AMBULATORY_CARE_PROVIDER_SITE_OTHER): Payer: Medicare Other | Admitting: Pharmacist

## 2016-02-18 VITALS — BP 124/64 | HR 67 | Ht 64.0 in | Wt 147.0 lb

## 2016-02-18 DIAGNOSIS — M810 Age-related osteoporosis without current pathological fracture: Secondary | ICD-10-CM | POA: Diagnosis not present

## 2016-02-18 DIAGNOSIS — Z0000 Encounter for general adult medical examination without abnormal findings: Secondary | ICD-10-CM

## 2016-02-18 MED ORDER — DENOSUMAB 60 MG/ML ~~LOC~~ SOLN
60.0000 mg | Freq: Once | SUBCUTANEOUS | Status: AC
  Administered 2016-02-18: 60 mg via SUBCUTANEOUS

## 2016-02-18 NOTE — Patient Instructions (Signed)
Lindsay Villarreal , Thank you for taking time to come for your Medicare Wellness Visit. I appreciate your ongoing commitment to your health goals. Please review the following plan we discussed and let me know if I can as sist you in the future.   These are the goals we discussed: Try to start exercise - walking and chair exercises - goal is 150 minutes per week.   Call office when you are ready to quit smoking - we can recommend medication that can help   Increase non-starchy vegetables - carrots, green bean, squash, zucchini, tomatoes, onions, peppers, spinach and other green leafy vegetables, cabbage, lettuce, cucumbers, asparagus, okra (not fried), eggplant limit sugar and processed foods (cakes, cookies, ice cream, crackers and chips) Increase fresh fruit but limit serving sizes 1/2 cup or about the size of tennis or baseball limit red meat to no more than 1-2 times per week (serving size about the size of your palm) Choose whole grains / lean proteins - whole wheat bread, quinoa, whole grain rice (1/2 cup), fish, chicken, [place]    This is a list of the screening recommended for you and due dates:  Health Maintenance  Topic Date Due  . Stool Blood Test  09/15/2014 - test has been given  . Shingles Vaccine  08/06/2016 - verified cost today - $258.28  . Flu Shot  06/17/2016  . Eye exam for diabetics  07/30/2016  . Lipid (cholesterol) test  08/06/2016  . Hemoglobin A1C  08/06/2016  . DEXA scan (bone density measurement)  01/10/2017  . Complete foot exam   02/03/2017  . Mammogram  03/11/2017  . Colon Cancer Screening  04/29/2020  . Tetanus Vaccine  07/30/2022  .  Hepatitis C: One time screening is recommended by Center for Disease Control  (CDC) for  adults born from [birth year] through 1965.   Never resulted - having lab look into results - may have to repeat.  . Pneumonia vaccines  Completed  *Topic was postponed. The date shown is not the original due date.   Health Maintenance,  Female Adopting a healthy lifestyle and getting preventive care can go a long way to promote health and wellness. Talk with your health care provider about what schedule of regular examinations is right for you. This is a good chance for you to check in with your provider about disease prevention and staying healthy. In between checkups, there are plenty of things you can do on your own. Experts have done a lot of research about which lifestyle changes and preventive measures are most likely to keep you healthy. Ask your health care provider for more information. WEIGHT AND DIET  Eat a healthy diet  Be sure to include plenty of vegetables, fruits, low-fat dairy products, and lean protein.  Do not eat a lot of foods high in solid fats, added sugars, or salt.  Get regular exercise. This is one of the most important things you can do for your health.  Most adults should exercise for at least 150 minutes each week. The exercise should increase your heart rate and make you sweat (moderate-intensity exercise).  Most adults should also do strengthening exercises at least twice a week. This is in addition to the moderate-intensity exercise.  Maintain a healthy weight  Body mass index (BMI) is a measurement that can be used to identify possible weight problems. It estimates body fat based on height and weight. Your health care provider can help determine your BMI and help you achieve or  maintain a healthy weight.  For females 74 years of age and older:   A BMI below 18.5 is considered underweight.  A BMI of 18.5 to 24.9 is normal.  A BMI of 25 to 29.9 is considered overweight.  A BMI of 30 and above is considered obese.  Watch levels of cholesterol and blood lipids  You should start having your blood tested for lipids and cholesterol at 70 years of age, then have this test every 5 years.  You may need to have your cholesterol levels checked more often if:  Your lipid or cholesterol levels  are high.  You are older than 70 years of age.  You are at high risk for heart disease.  CANCER SCREENING   Lung Cancer  Lung cancer screening is recommended for adults 81-72 years old who are at high risk for lung cancer because of a history of smoking.  A yearly low-dose CT scan of the lungs is recommended for people who:  Currently smoke.  Have quit within the past 15 years.  Have at least a 30-pack-year history of smoking. A pack year is smoking an average of one pack of cigarettes a day for 1 year.  Yearly screening should continue until it has been 15 years since you quit.  Yearly screening should stop if you develop a health problem that would prevent you from having lung cancer treatment.  Breast Cancer  Practice breast self-awareness. This means understanding how your breasts normally appear and feel.  It also means doing regular breast self-exams. Let your health care provider know about any changes, no matter how small.  If you are in your 20s or 30s, you should have a clinical breast exam (CBE) by a health care provider every 1-3 years as part of a regular health exam.  If you are 5 or older, have a CBE every year. Also consider having a breast X-ray (mammogram) every year.  If you have a family history of breast cancer, talk to your health care provider about genetic screening.  If you are at high risk for breast cancer, talk to your health care provider about having an MRI and a mammogram every year.  Breast cancer gene (BRCA) assessment is recommended for women who have family members with BRCA-related cancers. BRCA-related cancers include:  Breast.  Ovarian.  Tubal.  Peritoneal cancers.  Results of the assessment will determine the need for genetic counseling and BRCA1 and BRCA2 testing. Cervical Cancer Your health care provider may recommend that you be screened regularly for cancer of the pelvic organs (ovaries, uterus, and vagina). This screening  involves a pelvic examination, including checking for microscopic changes to the surface of your cervix (Pap test). You may be encouraged to have this screening done every 3 years, beginning at age 21.  For women ages 68-65, health care providers may recommend pelvic exams and Pap testing every 3 years, or they may recommend the Pap and pelvic exam, combined with testing for human papilloma virus (HPV), every 5 years. Some types of HPV increase your risk of cervical cancer. Testing for HPV may also be done on women of any age with unclear Pap test results.  Other health care providers may not recommend any screening for nonpregnant women who are considered low risk for pelvic cancer and who do not have symptoms. Ask your health care provider if a screening pelvic exam is right for you.  If you have had past treatment for cervical cancer or a condition that  could lead to cancer, you need Pap tests and screening for cancer for at least 20 years after your treatment. If Pap tests have been discontinued, your risk factors (such as having a new sexual partner) need to be reassessed to determine if screening should resume. Some women have medical problems that increase the chance of getting cervical cancer. In these cases, your health care provider may recommend more frequent screening and Pap tests. Colorectal Cancer  This type of cancer can be detected and often prevented.  Routine colorectal cancer screening usually begins at 70 years of age and continues through 70 years of age.  Your health care provider may recommend screening at an earlier age if you have risk factors for colon cancer.  Your health care provider may also recommend using home test kits to check for hidden blood in the stool.  A small camera at the end of a tube can be used to examine your colon directly (sigmoidoscopy or colonoscopy). This is done to check for the earliest forms of colorectal cancer.  Routine screening usually  begins at age 24.  Direct examination of the colon should be repeated every 5-10 years through 70 years of age. However, you may need to be screened more often if early forms of precancerous polyps or small growths are found. Skin Cancer  Check your skin from head to toe regularly.  Tell your health care provider about any new moles or changes in moles, especially if there is a change in a mole's shape or color.  Also tell your health care provider if you have a mole that is larger than the size of a pencil eraser.  Always use sunscreen. Apply sunscreen liberally and repeatedly throughout the day.  Protect yourself by wearing long sleeves, pants, a wide-brimmed hat, and sunglasses whenever you are outside. HEART DISEASE, DIABETES, AND HIGH BLOOD PRESSURE   High blood pressure causes heart disease and increases the risk of stroke. High blood pressure is more likely to develop in:  People who have blood pressure in the high end of the normal range (130-139/85-89 mm Hg).  People who are overweight or obese.  People who are African American.  If you are 16-46 years of age, have your blood pressure checked every 3-5 years. If you are 89 years of age or older, have your blood pressure checked every year. You should have your blood pressure measured twice--once when you are at a hospital or clinic, and once when you are not at a hospital or clinic. Record the average of the two measurements. To check your blood pressure when you are not at a hospital or clinic, you can use:  An automated blood pressure machine at a pharmacy.  A home blood pressure monitor.  If you are between 12 years and 72 years old, ask your health care provider if you should take aspirin to prevent strokes.  Have regular diabetes screenings. This involves taking a blood sample to check your fasting blood sugar level.  If you are at a normal weight and have a low risk for diabetes, have this test once every three years  after 70 years of age.  If you are overweight and have a high risk for diabetes, consider being tested at a younger age or more often. PREVENTING INFECTION  Hepatitis B  If you have a higher risk for hepatitis B, you should be screened for this virus. You are considered at high risk for hepatitis B if:  You were born in  a country where hepatitis B is common. Ask your health care provider which countries are considered high risk.  Your parents were born in a high-risk country, and you have not been immunized against hepatitis B (hepatitis B vaccine).  You have HIV or AIDS.  You use needles to inject street drugs.  You live with someone who has hepatitis B.  You have had sex with someone who has hepatitis B.  You get hemodialysis treatment.  You take certain medicines for conditions, including cancer, organ transplantation, and autoimmune conditions. Hepatitis C  Blood testing is recommended for:  Everyone born from [birth year] through 1965.  Anyone with known risk factors for hepatitis C. Sexually transmitted infections (STIs)  You should be screened for sexually transmitted infections (STIs) including gonorrhea and chlamydia if:  You are sexually active and are younger than 70 years of age.  You are older than 70 years of age and your health care provider tells you that you are at risk for this type of infection.  Your sexual activity has changed since you were last screened and you are at an increased risk for chlamydia or gonorrhea. Ask your health care provider if you are at risk.  If you do not have HIV, but are at risk, it may be recommended that you take a prescription medicine daily to prevent HIV infection. This is called pre-exposure prophylaxis (PrEP). You are considered at risk if:  You are sexually active and do not regularly use condoms or know the HIV status of your partner(s).  You take drugs by injection.  You are sexually active with a partner who has  HIV. Talk with your health care provider about whether you are at high risk of being infected with HIV. If you choose to begin PrEP, you should first be tested for HIV. You should then be tested every 3 months for as long as you are taking PrEP.  PREGNANCY   If you are premenopausal and you may become pregnant, ask your health care provider about preconception counseling.  If you may become pregnant, take 400 to 800 micrograms (mcg) of folic acid every day.  If you want to prevent pregnancy, talk to your health care provider about birth control (contraception). OSTEOPOROSIS AND MENOPAUSE   Osteoporosis is a disease in which the bones lose minerals and strength with aging. This can result in serious bone fractures. Your risk for osteoporosis can be identified using a bone density scan.  If you are 42 years of age or older, or if you are at risk for osteoporosis and fractures, ask your health care provider if you should be screened.  Ask your health care provider whether you should take a calcium or vitamin D supplement to lower your risk for osteoporosis.  Menopause may have certain physical symptoms and risks.  Hormone replacement therapy may reduce some of these symptoms and risks. Talk to your health care provider about whether hormone replacement therapy is right for you.  HOME CARE INSTRUCTIONS   Schedule regular health, dental, and eye exams.  Stay current with your immunizations.   Do not use any tobacco products including cigarettes, chewing tobacco, or electronic cigarettes.  If you are pregnant, do not drink alcohol.  If you are breastfeeding, limit how much and how often you drink alcohol.  Limit alcohol intake to no more than 1 drink per day for nonpregnant women. One drink equals 12 ounces of beer, 5 ounces of wine, or 1 ounces of hard liquor.  Do  not use street drugs.  Do not share needles.  Ask your health care provider for help if you need support or  information about quitting drugs.  Tell your health care provider if you often feel depressed.  Tell your health care provider if you have ever been abused or do not feel safe at home.   This information is not intended to replace advice given to you by your health care provider. Make sure you discuss any questions you have with your health care provider.   Document Released: 05/19/2011 Document Revised: 11/24/2014 Document Reviewed: 10/05/2013 Elsevier Interactive Patient Education [employer].

## 2016-02-18 NOTE — Progress Notes (Signed)
Patient ID: Lindsay Villarreal, female   DOB: Dec 07, 1945, 70 y.o.   MRN: [id number]    Subjective:   Lindsay Villarreal is a 70 y.o. female who presents for a subsequent Medicare Annual Wellness Visit and also for prolia injection which she received q 6 months.   Review of Systems  Review of Systems  Constitutional: Negative.   HENT: Negative.   Eyes: Negative.   Respiratory: Negative.   Gastrointestinal: Negative.   Genitourinary: Negative.   Musculoskeletal: Negative.   Skin: Negative.   Neurological: Positive for dizziness (occasional dizziness when standing).  Endo/Heme/Allergies: Negative.   Psychiatric/Behavioral: Negative.      Current Medications (verified) Outpatient Encounter Prescriptions as of 02/18/2016  Medication Sig  . aspirin 81 MG tablet Take 81 mg by mouth daily.  [name] b complex vitamins capsule Take 1 capsule by mouth daily.  . Biotin 10 MG TABS Take 4 tablets by mouth daily.  . calcium carbonate (OS-CAL) 600 MG TABS tablet Take 600 mg by mouth daily with breakfast.  . denosumab (PROLIA) 60 MG/ML SOLN injection Inject 60 mg into the skin every 6 (six) months. Administer in upper arm, thigh, or abdomen  . ferrous sulfate 325 (65 FE) MG EC tablet Take 1 tablet (325 mg total) by mouth daily.  [name] gabapentin (NEURONTIN) 100 MG capsule Take 1 capsule (100 mg total) by mouth 3 (three) times daily.  [name] lisinopril (PRINIVIL,ZESTRIL) 5 MG tablet Take 1 tablet (5 mg total) by mouth daily.  . metFORMIN (GLUCOPHAGE) 500 MG tablet Take 1 tablet (500 mg total) by mouth 2 (two) times daily.  . ONE TOUCH ULTRA TEST test strip USE TO CHECK GLUCOSE ONCE DAILY AS DIRECTED  . ONETOUCH DELICA LANCETS 99991111 MISC USE TO TEST GLUCOSE EVERY DAY OR AS DIRECTED  . simvastatin (ZOCOR) 20 MG tablet Take 1 tablet (20 mg total) by mouth daily.  . [EXPIRED] denosumab (PROLIA) injection 60 mg    No facility-administered encounter medications on file as of 02/18/2016.    Allergies (verified) Codeine    History: Past Medical History  Diagnosis Date  . Anemia   . GERD (gastroesophageal reflux disease)   . Hyperlipidemia   . Heart murmur   . Osteoporosis   . Diabetes mellitus without complication ([place])   . Neuromuscular disorder ([place])   . Cataract     bil cateracts removed  . Arthritis    Past Surgical History  Procedure Laterality Date  . Cataract extraction w/ intraocular lens implant  2010 (left), 2013 (right)  . Appendectomy  1978  . Eye surgery    . Tubal ligation    . Vaginal hysterectomy  1978  . Bladder repair    . Colonoscopy    . Polypectomy     Family History  Problem Relation Age of Onset  . Stomach cancer Father   . Pancreatic cancer Father   . Liver cancer Father   . Other Sister 66    Cancer of Bile Duct  . Breast cancer Sister   . Diabetes Sister   . Colon cancer Neg Hx   . Esophageal cancer Neg Hx   . Rectal cancer Neg Hx   . Brain cancer Sister 76  . Heart attack Mother   . Heart failure Mother   . Sarcoidosis Sister    Social History   Occupational History  . Not on file.   Social History Main Topics  . Smoking status: Current Every Day Smoker -- 0.50 packs/day  . Smokeless  tobacco: Never Used  . Alcohol Use: No  . Drug Use: No  . Sexual Activity: No    Do you feel safe at home?  Yes  Dietary issues and exercise activities: Current Exercise Habits: The patient does not participate in regular exercise at present  Current Dietary habits:  Patient follows low fat diets.  She tried to eat calcium rich foods daily   Objective:    Today's Vitals   02/18/16 0946  BP: 124/64  Pulse: 67  Height: 5\' 4"  (1.626 m)  Weight: 147 lb (66.679 kg)  PainSc: 2   PainLoc: Head   Body mass index is 25.22 kg/(m^2).  Activities of Daily Living In your present state of health, do you have any difficulty performing the following activities: 02/18/2016  Hearing? N  Vision? N  Difficulty concentrating or making decisions? N  Walking or climbing  stairs? N  Dressing or bathing? N  Doing errands, shopping? N  Preparing Food and eating ? N  Using the Toilet? N  In the past six months, have you accidently leaked urine? N  Do you have problems with loss of bowel control? N  Managing your Medications? N  Managing your Finances? N  Housekeeping or managing your Housekeeping? N    Are there smokers in your home (other than you)? No   Cardiac Risk Factors include: advanced age (>11men, >50 women);diabetes mellitus;dyslipidemia;sedentary lifestyle;smoking/ tobacco exposure  Depression Screen PHQ 2/9 Scores 02/18/2016 02/04/2016 07/17/2015 02/19/2015  PHQ - 2 Score 0 0 0 0    Fall Risk Fall Risk  02/18/2016 02/04/2016 07/17/2015 02/19/2015 02/12/2015  Falls in the past year? No No No No No    Cognitive Function: MMSE - Mini Mental State Exam 02/18/2016 02/12/2015  Orientation to time 5 5  Orientation to Place 5 5  Registration 3 3  Attention/ Calculation 5 5  Recall 3 3  Language- name 2 objects 2 2  Language- repeat 1 1  Language- follow 3 step command 3 3  Language- read & follow direction 1 1  Write a sentence 1 1  Copy design 1 1  Total score 30 30    Immunizations and Health Maintenance Immunization History  Administered Date(s) Administered  . Influenza, High Dose Seasonal PF 10/04/2015  . Influenza,inj,Quad PF,36+ Mos 09/09/2013, 08/14/2014  . Pneumococcal Conjugate-13 10/30/2014  . Pneumococcal Polysaccharide-23 07/30/2012  . Tdap 07/30/2012   Health Maintenance Due  Topic Date Due  . COLON CANCER SCREENING ANNUAL FOBT  09/15/2014    Patient Care Team: [name], FNP as PCP - General (Nurse Practitioner) Okey Regal, [place] as Consulting Physician (Optometry) Annia Belt, MD as Consulting Physician (Hematology) Irene Shipper, MD as Consulting Physician (Gastroenterology)  Indicate any recent Medical Services you may have received from other than [facility] providers in the past year (date may be  approximate).    Assessment:    Annual Wellness Visit  Osteoporosis   Screening Tests Health Maintenance  Topic Date Due  . COLON CANCER SCREENING ANNUAL FOBT  09/15/2014  . ZOSTAVAX  08/06/2016 (Originally 02/21/2006)  . INFLUENZA VACCINE  06/17/2016  . OPHTHALMOLOGY EXAM  07/30/2016  . LIPID PANEL  08/06/2016  . HEMOGLOBIN A1C  08/06/2016  . DEXA SCAN  01/10/2017  . FOOT EXAM  02/03/2017  . MAMMOGRAM  03/11/2017  . COLONOSCOPY  04/29/2020  . TETANUS/TDAP  07/30/2022  . Hepatitis C Screening  Completed  . PNA vac Low Risk Adult  Completed  Plan:   During the course of the visit Lindsay Villarreal was educated and counseled about the following appropriate screening and preventive services:   Vaccines to include Pneumoccal, Influenza, Td, Zostavax - patient is UTD on all vaccines except Zostavax.  Checked price ans would cost $258 today - postponed due to cost.  Colorectal cancer screening - colonoscopy is UTD.  Patient has stool card at home and reminded to RTO.  Cardiovascular disease screening - last EKG was less than 1 year ago  Diabetes - DM is well controlled.  Continue current medications   Bone Denisty / Osteoporosis Screening - UTD. Prolia 60mg  administered SQ in office today  Mammogram - UTD  Glaucoma screening / Diabetic Eye Exam - UTDNutrition counseling  Smoking cessation counseling - discussed health benefits of smoking cessation.  Patient voices understanding but declines further counseling or pharmacotherapy to assist.  Advanced Directives - information given in office today  Hep C - noticed that lab was ordered by her PCP at last visit but has not resulted yet.  Contacted [facility] and they are looking into - will likely need to be repeated.      Patient Instructions (the written plan) were given to the patient.   Cherre Robins, [facility]   02/18/2016

## 2016-02-27 ENCOUNTER — Other Ambulatory Visit: Payer: Medicare Other

## 2016-02-27 DIAGNOSIS — Z1212 Encounter for screening for malignant neoplasm of rectum: Secondary | ICD-10-CM | POA: Diagnosis not present

## 2016-03-03 LAB — FECAL OCCULT BLOOD, IMMUNOCHEMICAL: FECAL OCCULT BLD: NEGATIVE

## 2016-05-05 ENCOUNTER — Ambulatory Visit (INDEPENDENT_AMBULATORY_CARE_PROVIDER_SITE_OTHER): Payer: Medicare Other | Admitting: Physician Assistant

## 2016-05-05 ENCOUNTER — Encounter: Payer: Self-pay | Admitting: Physician Assistant

## 2016-05-05 VITALS — BP 135/67 | HR 65 | Temp 97.5°F | Ht 64.0 in | Wt 146.6 lb

## 2016-05-05 DIAGNOSIS — B029 Zoster without complications: Secondary | ICD-10-CM

## 2016-05-05 MED ORDER — VALACYCLOVIR HCL 1 G PO TABS: 1000.0000 mg | ORAL_TABLET | Freq: Three times a day (TID) | ORAL | Status: DC

## 2016-05-05 NOTE — Progress Notes (Signed)
Subjective:     Patient ID: Lindsay Villarreal, female   DOB: [birth year], 70 y.o.   MRN: [id number]  HPI Pt with rash to the L post shoulder ares for 2 days  Review of Systems + sl pain and pruritus No drainage    Objective:   Physical Exam Erythem rash to the post L shoulder/distal scap area Appears to be early cluster of vesicles No surrounding erythema, edema, or induration No other area seen along dermatone    Assessment:     1. Shingles        Plan:     Valtrex 1 gm tid #21 Transmission precautions reviewed F/U prn

## 2016-05-05 NOTE — Patient Instructions (Signed)

## 2016-05-22 ENCOUNTER — Encounter: Payer: Self-pay | Admitting: Nurse Practitioner

## 2016-05-22 ENCOUNTER — Ambulatory Visit (INDEPENDENT_AMBULATORY_CARE_PROVIDER_SITE_OTHER): Payer: Medicare Other | Admitting: Nurse Practitioner

## 2016-05-22 VITALS — BP 138/69 | HR 66 | Temp 96.8°F | Ht 64.0 in | Wt 147.0 lb

## 2016-05-22 DIAGNOSIS — E1143 Type 2 diabetes mellitus with diabetic autonomic (poly)neuropathy: Secondary | ICD-10-CM

## 2016-05-22 DIAGNOSIS — M810 Age-related osteoporosis without current pathological fracture: Secondary | ICD-10-CM | POA: Diagnosis not present

## 2016-05-22 DIAGNOSIS — E785 Hyperlipidemia, unspecified: Secondary | ICD-10-CM | POA: Diagnosis not present

## 2016-05-22 DIAGNOSIS — R079 Chest pain, unspecified: Secondary | ICD-10-CM | POA: Diagnosis not present

## 2016-05-22 DIAGNOSIS — I10 Essential (primary) hypertension: Secondary | ICD-10-CM

## 2016-05-22 DIAGNOSIS — E1142 Type 2 diabetes mellitus with diabetic polyneuropathy: Secondary | ICD-10-CM

## 2016-05-22 DIAGNOSIS — E118 Type 2 diabetes mellitus with unspecified complications: Secondary | ICD-10-CM | POA: Diagnosis not present

## 2016-05-22 DIAGNOSIS — D649 Anemia, unspecified: Secondary | ICD-10-CM

## 2016-05-22 LAB — BAYER DCA HB A1C WAIVED: HB A1C (BAYER DCA - WAIVED): 6.3 % (ref <7.0)

## 2016-05-22 MED ORDER — LISINOPRIL 5 MG PO TABS: 5.0000 mg | ORAL_TABLET | Freq: Every day | ORAL | Status: DC

## 2016-05-22 MED ORDER — METFORMIN HCL 500 MG PO TABS: 500.0000 mg | ORAL_TABLET | Freq: Two times a day (BID) | ORAL | Status: DC

## 2016-05-22 MED ORDER — SIMVASTATIN 20 MG PO TABS: 20.0000 mg | ORAL_TABLET | Freq: Every day | ORAL | Status: DC

## 2016-05-22 MED ORDER — GABAPENTIN 100 MG PO CAPS: 100.0000 mg | ORAL_CAPSULE | Freq: Three times a day (TID) | ORAL | Status: DC

## 2016-05-22 MED ORDER — FERROUS SULFATE 325 (65 FE) MG PO TBEC: 325.0000 mg | DELAYED_RELEASE_TABLET | Freq: Every day | ORAL | Status: DC

## 2016-05-22 NOTE — Progress Notes (Signed)
Subjective:    Patient ID: Lindsay Villarreal, female    DOB: 1946/04/28, 70 y.o.   MRN: [id number]   Patient here today for follow up of chronic medical problems- No complaints today  Current Outpatient Prescriptions on File Prior to Visit  Medication Sig Dispense Refill  . aspirin 81 MG tablet Take 81 mg by mouth daily.    [name] b complex vitamins capsule Take 1 capsule by mouth daily.    . Biotin 10 MG TABS Take 4 tablets by mouth daily.    . calcium carbonate (OS-CAL) 600 MG TABS tablet Take 600 mg by mouth daily with breakfast.    . denosumab (PROLIA) 60 MG/ML SOLN injection Inject 60 mg into the skin every 6 (six) months. Administer in upper arm, thigh, or abdomen 1 mL 0  . ferrous sulfate 325 (65 FE) MG EC tablet Take 1 tablet (325 mg total) by mouth daily. 30 tablet 5  . gabapentin (NEURONTIN) 100 MG capsule Take 1 capsule (100 mg total) by mouth 3 (three) times daily. 90 capsule 5  . lisinopril (PRINIVIL,ZESTRIL) 5 MG tablet Take 1 tablet (5 mg total) by mouth daily. 30 tablet 5  . metFORMIN (GLUCOPHAGE) 500 MG tablet Take 1 tablet (500 mg total) by mouth 2 (two) times daily. 60 tablet 5  . ONE TOUCH ULTRA TEST test strip USE TO CHECK GLUCOSE ONCE DAILY AS DIRECTED 100 each 1  . ONETOUCH DELICA LANCETS 75T MISC USE TO TEST GLUCOSE EVERY DAY OR AS DIRECTED 100 each 1  . simvastatin (ZOCOR) 20 MG tablet Take 1 tablet (20 mg total) by mouth daily. 30 tablet 5  * was seen 05/05/16  With shingles on left shoulder- Was given valtrex which helped withh resolution. * has occasional heavy feeling on chest- last episode lasted 2-3 days last week. Denies SOB or sweating with it.  Hyperlipidemia This is a chronic problem. The current episode started more than 1 year ago. The problem is controlled. Recent lipid tests were reviewed and are normal. Exacerbating diseases include diabetes. She has no history of hypothyroidism or obesity. Pertinent negatives include no chest pain, focal weakness or shortness of  breath. Current antihyperlipidemic treatment includes statins. The current treatment provides moderate improvement of lipids. There are no compliance problems.  Risk factors for coronary artery disease include diabetes mellitus, dyslipidemia, hypertension and post-menopausal.  Diabetes She presents for her follow-up diabetic visit. She has type 2 diabetes mellitus. No MedicAlert identification noted. Her disease course has been stable. There are no hypoglycemic associated symptoms. Pertinent negatives for hypoglycemia include no headaches. Pertinent negatives for diabetes include no chest pain. There are no hypoglycemic complications. Symptoms are stable. Diabetic complications include nephropathy. Risk factors for coronary artery disease include diabetes mellitus, dyslipidemia and hypertension. Current diabetic treatment includes oral agent (monotherapy). Her weight is stable. She is following a diabetic diet. When asked about meal planning, she reported none. She has not had a previous visit with a dietitian. She rarely participates in exercise. There is no change in her home blood glucose trend. Her breakfast blood glucose is taken between 9-10 am. Her breakfast blood glucose range is generally 110-130 mg/dl. Her overall blood glucose range is 90-110 mg/dl. An ACE inhibitor/angiotensin II receptor blocker is being taken. She does not see a podiatrist.Eye exam is current (Sept 16).  Hypertension This is a chronic problem. The current episode started more than 1 year ago. The problem is controlled. Pertinent negatives include no chest pain, headaches, neck pain, palpitations  or shortness of breath. There are no associated agents to hypertension. Risk factors for coronary artery disease include diabetes mellitus, dyslipidemia and post-menopausal state. Past treatments include ACE inhibitors. The current treatment provides significant improvement. There are no compliance problems.   Anemia Currently on nuiron-  was released by hematologist- just said for [place] to keep watch on labs. Pt denies fatigue. Occasionally constipation.  Osteoporosis Prolia every 6 months- no side effects Diabetic neuropathy neurotin really helping a lot with the burning sensation bil feet    Review of Systems  Constitutional: Negative.   HENT: Negative.   Eyes: Negative.   Respiratory: Negative.  Negative for shortness of breath.   Cardiovascular: Negative.  Negative for chest pain and palpitations.  Gastrointestinal: Negative.   Endocrine: Negative.   Genitourinary: Negative.   Musculoskeletal: Negative.  Negative for neck pain.  Skin: Negative.   Allergic/Immunologic: Negative.   Neurological: Negative.  Negative for focal weakness and headaches.  Hematological: Negative.   Psychiatric/Behavioral: Negative.   All other systems reviewed and are negative.      Objective:   Physical Exam  Constitutional: She is oriented to person, place, and time. Vital signs are normal. She appears well-developed and well-nourished.  HENT:  Right Ear: Hearing, tympanic membrane, external ear and ear canal normal.  Left Ear: Hearing, tympanic membrane, external ear and ear canal normal.  Nose: Nose normal.  Mouth/Throat: Uvula is midline, oropharynx is clear and moist and mucous membranes are normal.  Eyes: Conjunctivae and EOM are normal. Pupils are equal, round, and reactive to light.  Neck: Trachea normal, normal range of motion and full passive range of motion without pain. Neck supple. No JVD present. Carotid bruit is not present. No thyromegaly present.  Cardiovascular: Normal rate, regular rhythm and intact distal pulses.  Exam reveals no gallop and no friction rub.   Murmur (3/6 systolic murmur) heard. Pulmonary/Chest: Effort normal and breath sounds normal.  Abdominal: Soft. Normal appearance and bowel sounds are normal. She exhibits no distension and no mass. There is no tenderness.  Musculoskeletal: Normal range of  motion.  Lymphadenopathy:    She has no cervical adenopathy.  Neurological: She is alert and oriented to person, place, and time. She has normal reflexes.  Skin: Skin is warm, dry and intact.  Psychiatric: She has a normal mood and affect. Her speech is normal and behavior is normal. Judgment and thought content normal. Cognition and memory are normal.   BP 138/69 mmHg  Pulse 66  Temp(Src) 96.8 F (36 C) (Oral)  Ht '5\' 4"'  (1.626 m)  Wt 147 lb (66.679 kg)  BMI 25.22 kg/m2    A1C 6.3   Assessment & Plan:  1. Hyperlipidemia Low fat diet - Lipid panel - simvastatin (ZOCOR) 20 MG tablet; Take 1 tablet (20 mg total) by mouth daily.  Dispense: 30 tablet; Refill: 5  2. Essential hypertension Do not add salt to diet - CMP14+EGFR - lisinopril (PRINIVIL,ZESTRIL) 5 MG tablet; Take 1 tablet (5 mg total) by mouth daily.  Dispense: 30 tablet; Refill: 5  3. Type 2 diabetes mellitus with diabetic polyneuropathy, without long-term current use of insulin (HCC) Diabetic foot care reviewed - Bayer DCA Hb A1c Waived - metFORMIN (GLUCOPHAGE) 500 MG tablet; Take 1 tablet (500 mg total) by mouth 2 (two) times daily.  Dispense: 60 tablet; Refill: 5  4. Type 2 diabetes mellitus with complication, without long-term current use of insulin (HCC) Continue to watch carbs in diet  5. Diabetic autonomic neuropathy associated  with type 2 diabetes mellitus (HCC) - gabapentin (NEURONTIN) 100 MG capsule; Take 1 capsule (100 mg total) by mouth 3 (three) times daily.  Dispense: 90 capsule; Refill: 5  6. Anemia, unspecified anemia type - ferrous sulfate 325 (65 FE) MG EC tablet; Take 1 tablet (325 mg total) by mouth daily.  Dispense: 30 tablet; Refill: 5  7. Osteoporosis Weight bearing exercises  8. Chest pain, unspecified chest pain type Normal EKG- will refer to cardiologist if occurs again. - EKG 12-Lead    Labs pending Health maintenance reviewed Diet and exercise encouraged Continue all meds Follow  up  In 3 months   [place], FNP

## 2016-05-22 NOTE — Patient Instructions (Signed)

## 2016-05-23 LAB — CMP14+EGFR
ALBUMIN: 4 g/dL (ref 3.5–4.8)
ALK PHOS: 46 IU/L (ref 39–117)
ALT: 11 IU/L (ref 0–32)
AST: 15 IU/L (ref 0–40)
Albumin/Globulin Ratio: 1.9 (ref 1.2–2.2)
BUN / CREAT RATIO: 21 (ref 12–28)
BUN: 13 mg/dL (ref 8–27)
Bilirubin Total: 0.2 mg/dL (ref 0.0–1.2)
CO2: 24 mmol/L (ref 18–29)
CREATININE: 0.61 mg/dL (ref 0.57–1.00)
Calcium: 9.3 mg/dL (ref 8.7–10.3)
Chloride: 104 mmol/L (ref 96–106)
GFR calc Af Amer: 106 mL/min/{1.73_m2} (ref >59)
GFR calc non Af Amer: 92 mL/min/{1.73_m2} (ref >59)
GLUCOSE: 112 mg/dL — AB (ref 65–99)
Globulin, Total: 2.1 g/dL (ref 1.5–4.5)
Potassium: 4.5 mmol/L (ref 3.5–5.2)
Sodium: 142 mmol/L (ref 134–144)
TOTAL PROTEIN: 6.1 g/dL (ref 6.0–8.5)

## 2016-05-23 LAB — LIPID PANEL
CHOLESTEROL TOTAL: 115 mg/dL (ref 100–199)
Chol/HDL Ratio: 2.9 ratio units (ref 0.0–4.4)
HDL: 39 mg/dL — AB (ref >39)
LDL CALC: 30 mg/dL (ref 0–99)
Triglycerides: 231 mg/dL — ABNORMAL HIGH (ref 0–149)
VLDL CHOLESTEROL CAL: 46 mg/dL — AB (ref 5–40)

## 2016-06-01 DIAGNOSIS — H1031 Unspecified acute conjunctivitis, right eye: Secondary | ICD-10-CM | POA: Diagnosis not present

## 2016-06-16 ENCOUNTER — Encounter: Payer: Medicare Other | Admitting: *Deleted

## 2016-06-16 DIAGNOSIS — Z1231 Encounter for screening mammogram for malignant neoplasm of breast: Secondary | ICD-10-CM | POA: Diagnosis not present

## 2016-09-04 ENCOUNTER — Ambulatory Visit (INDEPENDENT_AMBULATORY_CARE_PROVIDER_SITE_OTHER): Payer: Medicare Other | Admitting: Pharmacist

## 2016-09-04 ENCOUNTER — Ambulatory Visit: Payer: Medicare Other

## 2016-09-04 DIAGNOSIS — Z23 Encounter for immunization: Secondary | ICD-10-CM | POA: Diagnosis not present

## 2016-09-04 DIAGNOSIS — E1142 Type 2 diabetes mellitus with diabetic polyneuropathy: Secondary | ICD-10-CM

## 2016-09-04 DIAGNOSIS — M810 Age-related osteoporosis without current pathological fracture: Secondary | ICD-10-CM | POA: Diagnosis not present

## 2016-09-04 MED ORDER — DENOSUMAB 60 MG/ML ~~LOC~~ SOLN
60.0000 mg | Freq: Once | SUBCUTANEOUS | Status: AC
  Administered 2016-09-04: 60 mg via SUBCUTANEOUS

## 2016-09-04 NOTE — Progress Notes (Signed)
Patient ID: Lindsay Villarreal, female   DOB: 10-25-1946, 70 y.o.   MRN: [id number]   [facility] Current Height:        Max Lifetime Height:  5' 5.5" Current Weight:         Ethnicity:Caucasian    HPI: Patient with osteoporosis.  Started Prolia injections 12/22/2012.  She has tolerated well.   Her last Prolia injection was 02/2016  Back Pain?  Yes - pt feels that Prolia helps decrease pain       Kyphosis?  No Prior fracture?  No Med(s) for Osteoporosis/Osteopenia:  Prolia 60mg  injected SQ q 6 months Med(s) previously tried for Osteoporosis/Osteopenia:  Fosamax and actonel both made bones and legs hurt                                                             PMH: Age at menopause:  50;s Hysterectomy?  Yes Oophorectomy?  No HRT? Yes - Former.  Type/duration: premarin for 5 years Steroid Use?  No Thyroid med?  No History of cancer?  No History of digestive disorders (ie Crohn's)?  No Current or previous eating disorders?  No Last Vitamin D Result:  30 (11/2012) Last GFR Result:  92 (05/22/2016)   FH/SH: Family history of osteoporosis?  Yes - mother Parent with history of hip fracture?  No Family history of breast cancer?  Yes - sister Exercise?  Yes - walking when its warm Smoking?  Yes   Alcohol?  No    Calcium Assessment Calcium Intake  # of servings/day  Calcium mg  Milk (8 oz) 1  x  300  = 300mg   Yogurt (4 oz) 0 x  200 = 0  Cheese (1 oz) 0 x  200 = 0  Other Calcium sources   250mg   Ca supplement 600mg  = 600mg    Estimated calcium intake per day 1150mg     DEXA Results Date of Test T-Score for AP Spine L1-L4 T-Score for Total Left Hip T-Score for Total Right Hip  01/10/2015 -2.3 -0.5 -0.5  12/01/2012 -2.9 -0.8 -0.6  07/25/2009 -2.8 -0.6 -0.8        Assessment: Osteoporosis with improved BMD since starting Prolia  Recommendations: 1.  Continue  Prolia 60mg  SQ q 6 months - administered today in office - next will be 03/05/2017 2.  continue calcium  1200mg  daily through supplementation or diet.  3.  recommend weight bearing exercise - 30 minutes at least 4 days per week.   4.  Counseled and educated about fall risk and prevention. 5. Urine microalbumin ordered today 6.  Influenza vaccine given today  Recheck DEXA:  01/10/2017 or after  Time spent counseling patient:  20 minutes   Cherre Robins, PharmD, CPP, CDE    Patient ID: Lindsay Villarreal, female   DOB: August 23, 1946, 70 y.o.   MRN: [id number]

## 2016-09-05 LAB — MICROALBUMIN / CREATININE URINE RATIO
CREATININE, UR: 122.9 mg/dL
MICROALB/CREAT RATIO: 15.2 mg/g{creat} (ref 0.0–30.0)
Microalbumin, Urine: 18.7 ug/mL

## 2016-10-10 ENCOUNTER — Other Ambulatory Visit: Payer: Self-pay | Admitting: Nurse Practitioner

## 2016-10-10 DIAGNOSIS — D649 Anemia, unspecified: Secondary | ICD-10-CM

## 2016-10-21 ENCOUNTER — Other Ambulatory Visit: Payer: Medicare Other

## 2016-10-21 ENCOUNTER — Other Ambulatory Visit: Payer: Self-pay | Admitting: Nurse Practitioner

## 2016-10-21 DIAGNOSIS — E782 Mixed hyperlipidemia: Secondary | ICD-10-CM

## 2016-10-21 DIAGNOSIS — R6889 Other general symptoms and signs: Secondary | ICD-10-CM | POA: Diagnosis not present

## 2016-10-21 DIAGNOSIS — D649 Anemia, unspecified: Secondary | ICD-10-CM | POA: Diagnosis not present

## 2016-10-21 DIAGNOSIS — E118 Type 2 diabetes mellitus with unspecified complications: Secondary | ICD-10-CM

## 2016-10-21 DIAGNOSIS — I10 Essential (primary) hypertension: Secondary | ICD-10-CM

## 2016-10-21 LAB — BAYER DCA HB A1C WAIVED: HB A1C: 6.1 % (ref <7.0)

## 2016-10-22 ENCOUNTER — Encounter: Payer: Self-pay | Admitting: Nurse Practitioner

## 2016-10-22 ENCOUNTER — Ambulatory Visit: Payer: Medicare Other | Admitting: Physician Assistant

## 2016-10-22 ENCOUNTER — Ambulatory Visit (INDEPENDENT_AMBULATORY_CARE_PROVIDER_SITE_OTHER): Payer: Medicare Other | Admitting: Nurse Practitioner

## 2016-10-22 VITALS — BP 113/67 | HR 72 | Temp 97.3°F | Ht 64.0 in | Wt 143.0 lb

## 2016-10-22 DIAGNOSIS — R0789 Other chest pain: Secondary | ICD-10-CM

## 2016-10-22 DIAGNOSIS — K219 Gastro-esophageal reflux disease without esophagitis: Secondary | ICD-10-CM

## 2016-10-22 DIAGNOSIS — R142 Eructation: Secondary | ICD-10-CM | POA: Diagnosis not present

## 2016-10-22 LAB — ANEMIA PROFILE B
BASOS ABS: 0 10*3/uL (ref 0.0–0.2)
Basos: 0 %
EOS (ABSOLUTE): 0.1 10*3/uL (ref 0.0–0.4)
EOS: 1 %
FERRITIN: 129 ng/mL (ref 15–150)
FOLATE: 9.3 ng/mL (ref >3.0)
HEMOGLOBIN: 13.2 g/dL (ref 11.1–15.9)
Hematocrit: 39.8 % (ref 34.0–46.6)
IMMATURE GRANS (ABS): 0 10*3/uL (ref 0.0–0.1)
IMMATURE GRANULOCYTES: 0 %
Iron Saturation: 26 % (ref 15–55)
Iron: 85 ug/dL (ref 27–139)
LYMPHS: 30 %
Lymphocytes Absolute: 2.3 10*3/uL (ref 0.7–3.1)
MCH: 29 pg (ref 26.6–33.0)
MCHC: 33.2 g/dL (ref 31.5–35.7)
MCV: 88 fL (ref 79–97)
MONOS ABS: 0.6 10*3/uL (ref 0.1–0.9)
Monocytes: 7 %
NEUTROS PCT: 62 %
Neutrophils Absolute: 4.9 10*3/uL (ref 1.4–7.0)
PLATELETS: 322 10*3/uL (ref 150–379)
RBC: 4.55 x10E6/uL (ref 3.77–5.28)
RDW: 13.6 % (ref 12.3–15.4)
Retic Ct Pct: 0.9 % (ref 0.6–2.6)
TIBC: 325 ug/dL (ref 250–450)
UIBC: 240 ug/dL (ref 118–369)
VITAMIN B 12: 340 pg/mL (ref 232–1245)
WBC: 7.9 10*3/uL (ref 3.4–10.8)

## 2016-10-22 LAB — CMP14+EGFR
ALBUMIN: 4.3 g/dL (ref 3.5–4.8)
ALT: 19 IU/L (ref 0–32)
AST: 17 IU/L (ref 0–40)
Albumin/Globulin Ratio: 2 (ref 1.2–2.2)
Alkaline Phosphatase: 47 IU/L (ref 39–117)
BUN / CREAT RATIO: 22 (ref 12–28)
BUN: 14 mg/dL (ref 8–27)
Bilirubin Total: 0.3 mg/dL (ref 0.0–1.2)
CO2: 27 mmol/L (ref 18–29)
CREATININE: 0.63 mg/dL (ref 0.57–1.00)
Calcium: 9.1 mg/dL (ref 8.7–10.3)
Chloride: 101 mmol/L (ref 96–106)
GFR calc non Af Amer: 91 mL/min/{1.73_m2} (ref >59)
GFR, EST AFRICAN AMERICAN: 105 mL/min/{1.73_m2} (ref >59)
GLUCOSE: 116 mg/dL — AB (ref 65–99)
Globulin, Total: 2.2 g/dL (ref 1.5–4.5)
Potassium: 4.1 mmol/L (ref 3.5–5.2)
Sodium: 142 mmol/L (ref 134–144)
TOTAL PROTEIN: 6.5 g/dL (ref 6.0–8.5)

## 2016-10-22 LAB — LIPID PANEL
CHOLESTEROL TOTAL: 121 mg/dL (ref 100–199)
Chol/HDL Ratio: 2.8 ratio units (ref 0.0–4.4)
HDL: 44 mg/dL (ref >39)
LDL CALC: 50 mg/dL (ref 0–99)
Triglycerides: 137 mg/dL (ref 0–149)
VLDL CHOLESTEROL CAL: 27 mg/dL (ref 5–40)

## 2016-10-22 MED ORDER — OMEPRAZOLE 40 MG PO CPDR: 40.0000 mg | DELAYED_RELEASE_CAPSULE | Freq: Every day | ORAL | 3 refills | Status: DC

## 2016-10-22 NOTE — Patient Instructions (Signed)
Helicobacter Pylori Infection Introduction Helicobacter pylori infection is an infection in the stomach that is caused by the Helicobacter pylori (H. pylori) bacteria. This type of bacteria often lives in the lining of the stomach. The infection can cause ulcers and irritation (gastritis) in some people. It is the most common cause of ulcers in the stomach (gastric ulcer) and in the upper part of the intestine (duodenal ulcer). Having this infection may also increase the risk of stomach cancer and a type of white blood cell cancer (lymphoma) that affects the stomach. What are the causes? H. pylori is a type of bacteria that is often found in the stomachs of healthy people. The bacteria may be passed from person to person through contact with stool or saliva. It is not known why some people develop ulcers, gastritis, or cancer from the infection. What increases the risk? This condition is more likely to develop in people who:  Have family members with the infection.  Live with many other people, such as in a dormitory.  Are of African, Hispanic, or Asian descent. What are the signs or symptoms? Most people with this infection do not have symptoms. If you do have symptoms, they may include:  Heartburn.  Stomach pain.  Nausea.  Vomiting.  Blood-tinged vomit.  Loss of appetite.  Bad breath. How is this diagnosed? This condition may be diagnosed based on your symptoms, a physical exam, and various tests. Tests may include:  Blood tests or stool tests to check for the proteins (antibodies) that your body may produce in response to the bacteria. These tests are the best way to confirm the diagnosis.  A breath test to check for the type of gas that the H. pylori bacteria release after breaking down a substance called urea. For the test, you are asked to drink urea. This test is often done after treatment in order to find out if the treatment worked.  A procedure in which a thin, flexible  tube with a tiny camera at the end is placed into your stomach and upper intestine (upper endoscopy). Your health care provider may also take tissue samples (biopsy) to test for H. pylori and cancer. How is this treated? Treatment for this condition usually involves taking a combination of medicines (triple therapy) for a couple of weeks. Triple therapy includes one medicine to reduce the acid in your stomach and two types of antibiotic medicines. Many drug combinations have been approved for treatment. Treatment usually kills the H. pylori and reduces your risk of cancer. You may need to be tested for H. pylori again after treatment. In some cases, the treatment may need to be repeated. Follow these instructions at home:  Take over-the-counter and prescription medicines only as told by your health care provider.  Take your antibiotics as told by your health care provider. Do not stop taking the antibiotics even if you start to feel better.  You can do all your usual activities and eat what you usually do.  Take steps to prevent future infections:  Wash your hands often.  Make sure the food you eat has been properly prepared.  Drink water only from clean sources.  Keep all follow-up visits as told by your health care provider. This is important. Contact a health care provider if:  Your symptoms do not get better.  Your symptoms return after treatment. This information is not intended to replace advice given to you by your health care provider. Make sure you discuss any questions you have  with your health care provider. Document Released: 02/25/2016 Document Revised: 04/10/2016 Document Reviewed: 11/15/2014  2017 Elsevier

## 2016-10-22 NOTE — Progress Notes (Signed)
   Subjective:    Patient ID: Lindsay Villarreal, female    DOB: [birth year], 70 y.o.   MRN: [id number]  HPI Patient in today c/o says that she has a burning sensation in chest with lots of belching. Is worse after she eats. Has been going on work 1-2 weeks.    Review of Systems  Constitutional: Negative for appetite change and fatigue.  HENT: Negative.   Respiratory: Negative for shortness of breath.   Cardiovascular: Positive for chest pain. Negative for palpitations and leg swelling.  Gastrointestinal: Negative.   Genitourinary: Negative.   Neurological: Negative.   Psychiatric/Behavioral: Negative.   All other systems reviewed and are negative.      Objective:   Physical Exam  Constitutional: She is oriented to person, place, and time. She appears well-developed and well-nourished. No distress.  Cardiovascular: Normal rate, regular rhythm and normal heart sounds.   Pulmonary/Chest: Effort normal.  Abdominal: Soft. Bowel sounds are normal. There is tenderness (mild epigastric pain on palpation).  Neurological: She is alert and oriented to person, place, and time.  Skin: Skin is warm.  Psychiatric: She has a normal mood and affect. Her behavior is normal. Judgment and thought content normal.   BP 113/67   Pulse 72   Temp 97.3 F (36.3 C) (Oral)   Ht 5\' 4"  (1.626 m)   Wt 143 lb (64.9 kg)   BMI 24.55 kg/m   Lindsay Nissen, FNP       Assessment & Plan:  1. Other chest pain - EKG 12-Lead  2. Gastroesophageal reflux disease without esophagitis Avoid spicy foods Do not eat 2 hours prior to bedtime - omeprazole (PRILOSEC) 40 MG capsule; Take 1 capsule (40 mg total) by mouth daily.  Dispense: 30 capsule; Refill: 3  3. Belching LABS PENDING - H Pylori, IGM, IGG, IGA AB    Labs pending Health maintenance reviewed Diet and exercise encouraged Continue all meds Follow up  [place], FNP

## 2016-10-23 LAB — H PYLORI, IGM, IGG, IGA AB
H PYLORI IGG: 3.3 U/mL — AB (ref 0.0–0.8)
H. pylori, IgA Abs: <9 units (ref 0.0–8.9)

## 2016-10-27 ENCOUNTER — Telehealth: Payer: Self-pay | Admitting: Nurse Practitioner

## 2016-10-27 ENCOUNTER — Ambulatory Visit (INDEPENDENT_AMBULATORY_CARE_PROVIDER_SITE_OTHER): Payer: Medicare Other | Admitting: Nurse Practitioner

## 2016-10-27 ENCOUNTER — Other Ambulatory Visit: Payer: Self-pay | Admitting: Nurse Practitioner

## 2016-10-27 ENCOUNTER — Encounter: Payer: Self-pay | Admitting: Nurse Practitioner

## 2016-10-27 VITALS — BP 125/68 | HR 65 | Temp 97.2°F | Ht 64.0 in | Wt 145.0 lb

## 2016-10-27 DIAGNOSIS — B9681 Helicobacter pylori [H. pylori] as the cause of diseases classified elsewhere: Secondary | ICD-10-CM

## 2016-10-27 DIAGNOSIS — K219 Gastro-esophageal reflux disease without esophagitis: Secondary | ICD-10-CM

## 2016-10-27 DIAGNOSIS — K2970 Gastritis, unspecified, without bleeding: Secondary | ICD-10-CM | POA: Diagnosis not present

## 2016-10-27 DIAGNOSIS — E1142 Type 2 diabetes mellitus with diabetic polyneuropathy: Secondary | ICD-10-CM

## 2016-10-27 DIAGNOSIS — M810 Age-related osteoporosis without current pathological fracture: Secondary | ICD-10-CM

## 2016-10-27 DIAGNOSIS — E118 Type 2 diabetes mellitus with unspecified complications: Secondary | ICD-10-CM | POA: Diagnosis not present

## 2016-10-27 DIAGNOSIS — I10 Essential (primary) hypertension: Secondary | ICD-10-CM

## 2016-10-27 DIAGNOSIS — E782 Mixed hyperlipidemia: Secondary | ICD-10-CM | POA: Diagnosis not present

## 2016-10-27 DIAGNOSIS — D649 Anemia, unspecified: Secondary | ICD-10-CM

## 2016-10-27 DIAGNOSIS — E1143 Type 2 diabetes mellitus with diabetic autonomic (poly)neuropathy: Secondary | ICD-10-CM | POA: Diagnosis not present

## 2016-10-27 MED ORDER — GABAPENTIN 100 MG PO CAPS: 100.0000 mg | ORAL_CAPSULE | Freq: Three times a day (TID) | ORAL | 5 refills | Status: DC

## 2016-10-27 MED ORDER — LISINOPRIL 5 MG PO TABS: 5.0000 mg | ORAL_TABLET | Freq: Every day | ORAL | 5 refills | Status: DC

## 2016-10-27 MED ORDER — AMOXICILLIN 875 MG PO TABS: 875.0000 mg | ORAL_TABLET | Freq: Two times a day (BID) | ORAL | 0 refills | Status: DC

## 2016-10-27 MED ORDER — FERROUS SULFATE 325 (65 FE) MG PO TBEC: 325.0000 mg | DELAYED_RELEASE_TABLET | Freq: Every day | ORAL | 5 refills | Status: DC

## 2016-10-27 MED ORDER — LANSOPRAZOLE 30 MG PO CPDR: 30.0000 mg | DELAYED_RELEASE_CAPSULE | Freq: Every day | ORAL | 0 refills | Status: DC

## 2016-10-27 MED ORDER — CLARITHROMYCIN 500 MG PO TABS: 500.0000 mg | ORAL_TABLET | Freq: Two times a day (BID) | ORAL | 0 refills | Status: DC

## 2016-10-27 MED ORDER — SIMVASTATIN 20 MG PO TABS: 20.0000 mg | ORAL_TABLET | Freq: Every day | ORAL | 5 refills | Status: DC

## 2016-10-27 MED ORDER — OMEPRAZOLE 40 MG PO CPDR: 40.0000 mg | DELAYED_RELEASE_CAPSULE | Freq: Every day | ORAL | 3 refills | Status: DC

## 2016-10-27 MED ORDER — METFORMIN HCL 500 MG PO TABS: 500.0000 mg | ORAL_TABLET | Freq: Two times a day (BID) | ORAL | 5 refills | Status: DC

## 2016-10-27 NOTE — Progress Notes (Signed)
Subjective:    Patient ID: [name], female    DOB: 1946/06/25, 70 y.o.   MRN: [id number]   Patient here today for follow up of chronic medical problems- No complaints today  Current Outpatient Prescriptions on File Prior to Visit  Medication Sig Dispense Refill  . aspirin 81 MG tablet Take 81 mg by mouth daily.    [name] b complex vitamins capsule Take 1 capsule by mouth daily.    . Biotin 10 MG TABS Take 4 tablets by mouth daily.    . calcium carbonate (OS-CAL) 600 MG TABS tablet Take 600 mg by mouth daily with breakfast.    . denosumab (PROLIA) 60 MG/ML SOLN injection Inject 60 mg into the skin every 6 (six) months. Administer in upper arm, thigh, or abdomen 1 mL 0  . ferrous sulfate 325 (65 FE) MG EC tablet Take 1 tablet (325 mg total) by mouth daily. 30 tablet 5  . gabapentin (NEURONTIN) 100 MG capsule Take 1 capsule (100 mg total) by mouth 3 (three) times daily. 90 capsule 5  . lisinopril (PRINIVIL,ZESTRIL) 5 MG tablet Take 1 tablet (5 mg total) by mouth daily. 30 tablet 5  . metFORMIN (GLUCOPHAGE) 500 MG tablet Take 1 tablet (500 mg total) by mouth 2 (two) times daily. 60 tablet 5  . ONE TOUCH ULTRA TEST test strip USE TO CHECK GLUCOSE ONCE DAILY AS DIRECTED 100 each 1  . ONETOUCH DELICA LANCETS 99991111 MISC USE TO TEST GLUCOSE EVERY DAY OR AS DIRECTED 100 each 1  . simvastatin (ZOCOR) 20 MG tablet Take 1 tablet (20 mg total) by mouth daily. 30 tablet 5   Patient was seen last week with belching and reflux- her hpylori test came back posaitive- medication was sent to pharmacy this morning for her to pick up.  Hyperlipidemia  This is a chronic problem. The current episode started more than 1 year ago. The problem is controlled. Recent lipid tests were reviewed and are normal. Exacerbating diseases include diabetes. She has no history of hypothyroidism or obesity. Pertinent negatives include no chest pain, focal weakness or shortness of breath. Current antihyperlipidemic treatment includes  statins. The current treatment provides moderate improvement of lipids. There are no compliance problems.  Risk factors for coronary artery disease include diabetes mellitus, dyslipidemia, hypertension and post-menopausal.  Diabetes  She presents for her follow-up diabetic visit. She has type 2 diabetes mellitus. No MedicAlert identification noted. Her disease course has been stable. There are no hypoglycemic associated symptoms. Pertinent negatives for hypoglycemia include no headaches. Pertinent negatives for diabetes include no chest pain. There are no hypoglycemic complications. Symptoms are stable. Diabetic complications include nephropathy. Risk factors for coronary artery disease include diabetes mellitus, dyslipidemia and hypertension. Current diabetic treatment includes oral agent (monotherapy). Her weight is stable. She is following a diabetic diet. When asked about meal planning, she reported none. She has not had a previous visit with a dietitian. She rarely participates in exercise. There is no change in her home blood glucose trend. Her breakfast blood glucose is taken between 9-10 am. Her breakfast blood glucose range is generally 110-130 mg/dl. Her highest blood glucose is 130-140 mg/dl. Her overall blood glucose range is 110-130 mg/dl. An ACE inhibitor/angiotensin II receptor blocker is being taken. She does not see a podiatrist.Eye exam is current (Sept 16).  Hypertension  This is a chronic problem. The current episode started more than 1 year ago. The problem is controlled. Pertinent negatives include no chest pain, headaches, neck pain,  palpitations or shortness of breath. There are no associated agents to hypertension. Risk factors for coronary artery disease include diabetes mellitus, dyslipidemia and post-menopausal state. Past treatments include ACE inhibitors. The current treatment provides significant improvement. There are no compliance problems.   Anemia Currently on nuiron- was  released by hematologist- just said for [place] to keep watch on labs. Pt denies fatigue. Occasionally constipation.  Osteoporosis Prolia every 6 months- no side effects Diabetic neuropathy neurotin really helping a lot with the burning sensation bil feet    Review of Systems  Constitutional: Negative.   HENT: Negative.   Eyes: Negative.   Respiratory: Negative.  Negative for shortness of breath.   Cardiovascular: Negative.  Negative for chest pain and palpitations.  Gastrointestinal: Negative.   Endocrine: Negative.   Genitourinary: Negative.   Musculoskeletal: Negative.  Negative for neck pain.  Skin: Negative.   Allergic/Immunologic: Negative.   Neurological: Negative.  Negative for focal weakness and headaches.  Hematological: Negative.   Psychiatric/Behavioral: Negative.   All other systems reviewed and are negative.      Objective:   Physical Exam  Constitutional: She is oriented to person, place, and time. Vital signs are normal. She appears well-developed and well-nourished.  HENT:  Right Ear: Hearing, tympanic membrane, external ear and ear canal normal.  Left Ear: Hearing, tympanic membrane, external ear and ear canal normal.  Nose: Nose normal.  Mouth/Throat: Uvula is midline, oropharynx is clear and moist and mucous membranes are normal.  Eyes: Conjunctivae and EOM are normal. Pupils are equal, round, and reactive to light.  Neck: Trachea normal, normal range of motion and full passive range of motion without pain. Neck supple. No JVD present. Carotid bruit is not present. No thyromegaly present.  Cardiovascular: Normal rate, regular rhythm and intact distal pulses.  Exam reveals no gallop and no friction rub.   Murmur (3/6 systolic murmur) heard. Pulmonary/Chest: Effort normal and breath sounds normal.  Abdominal: Soft. Normal appearance and bowel sounds are normal. She exhibits no distension and no mass. There is no tenderness.  Musculoskeletal: Normal range of  motion.  Lymphadenopathy:    She has no cervical adenopathy.  Neurological: She is alert and oriented to person, place, and time. She has normal reflexes.  Skin: Skin is warm, dry and intact.  Psychiatric: She has a normal mood and affect. Her speech is normal and behavior is normal. Judgment and thought content normal. Cognition and memory are normal.   BP 125/68   Pulse 65   Temp 97.2 F (36.2 C) (Oral)   Ht 5\' 4"  (1.626 m)   Wt 145 lb (65.8 kg)   BMI 24.89 kg/m      hgba1c 6.1%   Assessment & Plan:  1. Essential hypertension Low sodium diet - lisinopril (PRINIVIL,ZESTRIL) 5 MG tablet; Take 1 tablet (5 mg total) by mouth daily.  Dispense: 30 tablet; Refill: 5  2. Type 2 diabetes mellitus with complication, without long-term current use of insulin (HCC) Carb counting  3. Diabetic autonomic neuropathy associated with type 2 diabetes mellitus ([place]) Do not go barefooted - gabapentin (NEURONTIN) 100 MG capsule; Take 1 capsule (100 mg total) by mouth 3 (three) times daily.  Dispense: 90 capsule; Refill: 5  4. Age-related osteoporosis without current pathological fracture Weight bearing exercises  5. Anemia, unspecified type - ferrous sulfate 325 (65 FE) MG EC tablet; Take 1 tablet (325 mg total) by mouth daily.  Dispense: 30 tablet; Refill: 5  6. Mixed hyperlipidemia Low fat diet -  simvastatin (ZOCOR) 20 MG tablet; Take 1 tablet (20 mg total) by mouth daily.  Dispense: 30 tablet; Refill: 5  7. Gastroesophageal reflux disease without esophagitis Avoid spicy foods Do not eat 2 hours prior to bedtime - omeprazole (PRILOSEC) 40 MG capsule; Take 1 capsule (40 mg total) by mouth daily.  Dispense: 30 capsule; Refill: 3  8. Type 2 diabetes mellitus with diabetic polyneuropathy, without long-term current use of insulin (HCC) Carb counting - metFORMIN (GLUCOPHAGE) 500 MG tablet; Take 1 tablet (500 mg total) by mouth 2 (two) times daily.  Dispense: 60 tablet; Refill: 5  9.  Helicobacter positive gastritis Discussed with patient- rx sent to pharmacy earlier today allowe time for questions and answers    Labs pending Health maintenance reviewed Diet and exercise encouraged Continue all meds Follow up  In 3 months   [place], FNP

## 2016-10-27 NOTE — Telephone Encounter (Signed)
Pt notified that RX needed PA PA was obtained Pt notified

## 2017-01-27 ENCOUNTER — Ambulatory Visit (INDEPENDENT_AMBULATORY_CARE_PROVIDER_SITE_OTHER): Payer: Medicare Other | Admitting: Family Medicine

## 2017-01-27 ENCOUNTER — Ambulatory Visit (INDEPENDENT_AMBULATORY_CARE_PROVIDER_SITE_OTHER): Payer: Medicare Other

## 2017-01-27 ENCOUNTER — Encounter: Payer: Self-pay | Admitting: Family Medicine

## 2017-01-27 VITALS — BP 91/53 | HR 77 | Temp 97.1°F | Ht 64.0 in | Wt 146.0 lb

## 2017-01-27 DIAGNOSIS — M25562 Pain in left knee: Secondary | ICD-10-CM

## 2017-01-27 NOTE — Progress Notes (Signed)
Subjective:  Patient ID: Lindsay Villarreal, female    DOB: 1946-08-06  Age: 71 y.o. MRN: [id number]  CC: Knee Pain (pt here today c/o left knee pain)   HPI Lindsay Villarreal presents for 3-4 weeeks of increasing pain in the left knee. There is moderate associated swelling. Pain is worst laterally and posteriorly. It is worse trying to walk up stairs as well as sitting down or standing up.  She denies injury.    History Clema has a past medical history of Anemia; Arthritis; Cataract; Diabetes mellitus without complication ([place]); GERD (gastroesophageal reflux disease); Heart murmur; Hyperlipidemia; Neuromuscular disorder ([place]); and Osteoporosis.   She has a past surgical history that includes Cataract extraction w/ intraocular lens implant (2010 (left), 2013 (right)); Appendectomy (1978); Eye surgery; Tubal ligation; Vaginal hysterectomy (1978); Bladder repair; Colonoscopy; and Polypectomy.   Her family history includes Brain cancer (age of onset: 19) in her sister; Breast cancer in her sister; Diabetes in her sister; Heart attack in her mother; Heart failure in her mother; Liver cancer in her father; Other (age of onset: 26) in her sister; Pancreatic cancer in her father; Sarcoidosis in her sister; Stomach cancer in her father.She reports that she has been smoking.  She has been smoking about 0.50 packs per day. She has never used smokeless tobacco. She reports that she does not drink alcohol or use drugs.    ROS Review of Systems  Constitutional: Positive for activity change.  Respiratory: Negative for shortness of breath.   Cardiovascular: Negative for chest pain.  Gastrointestinal: Negative for abdominal pain.  Musculoskeletal: Positive for arthralgias.  Psychiatric/Behavioral: The patient is not nervous/anxious.     Objective:  BP (!) 91/53   Pulse 77   Temp 97.1 F (36.2 C) (Oral)   Ht 5\' 4"  (1.626 m)   Wt 146 lb (66.2 kg)   BMI 25.06 kg/m   BP Readings from Last 3 Encounters:    01/27/17 (!) 91/53  10/27/16 125/68  10/22/16 113/67    Wt Readings from Last 3 Encounters:  01/27/17 146 lb (66.2 kg)  10/27/16 145 lb (65.8 kg)  10/22/16 143 lb (64.9 kg)     Physical Exam  Pulmonary/Chest: She exhibits tenderness.  Musculoskeletal:  Theleft  knee has full range of motion with moderate discomfort actively and passively. Gait is with a limp. The lateral joint line  Is tender. The patella is palpable without tenderness or edema.  Lachman / anterior drawer signs are negative for signs of instability and pain free. McMurray testing reveals no pop or excessive discomfort. Varus and valgus stree maneuvers do not cause ligamentous stretch or instability.     A steroid injection was performed at lateral aspect left knee using 4 cc marcan  and 6 mg of Celestone. This was well tolerated.   Assessment & Plan:   Desaray was seen today for knee pain.  Diagnoses and all orders for this visit:  Acute pain of left knee -     DG Knee 1-2 Views Left; Future   XR - no acute abnormalities    I have discontinued Ms. Mccleave lansoprazole, amoxicillin, clarithromycin, and omeprazole. I am also having her maintain her aspirin, calcium carbonate, b complex vitamins, Biotin, ONE TOUCH ULTRA TEST, ONETOUCH DELICA LANCETS 27C, denosumab, ferrous sulfate, and gabapentin.  Allergies as of 01/27/2017      Reactions   Codeine Other (See Comments)   Hallucinations.      Medication List  Accurate as of 01/27/17 11:59 PM. Always use your most recent med list.          aspirin 81 MG tablet Take 81 mg by mouth daily.   b complex vitamins capsule Take 1 capsule by mouth daily.   Biotin 10 MG Tabs Take 4 tablets by mouth daily.   calcium carbonate 600 MG Tabs tablet Commonly known as:  OS-CAL Take 600 mg by mouth daily with breakfast.   denosumab 60 MG/ML Soln injection Commonly known as:  PROLIA Inject 60 mg into the skin every 6 (six) months. Administer in upper  arm, thigh, or abdomen   ferrous sulfate 325 (65 FE) MG EC tablet Take 1 tablet (325 mg total) by mouth daily.   gabapentin 100 MG capsule Commonly known as:  NEURONTIN Take 1 capsule (100 mg total) by mouth 3 (three) times daily.   lisinopril 5 MG tablet Commonly known as:  PRINIVIL,ZESTRIL Take 1 tablet (5 mg total) by mouth daily.   metFORMIN 500 MG tablet Commonly known as:  GLUCOPHAGE Take 1 tablet (500 mg total) by mouth 2 (two) times daily.   ONE TOUCH ULTRA TEST test strip Generic drug:  glucose blood USE TO CHECK GLUCOSE ONCE DAILY AS DIRECTED   ONETOUCH DELICA LANCETS 91P Misc USE TO TEST GLUCOSE EVERY DAY OR AS DIRECTED   simvastatin 20 MG tablet Commonly known as:  ZOCOR Take 1 tablet (20 mg total) by mouth daily.        Follow-up: No Follow-up on file.  Claretta Fraise, M.D.

## 2017-01-28 ENCOUNTER — Other Ambulatory Visit: Payer: Self-pay

## 2017-01-28 DIAGNOSIS — I10 Essential (primary) hypertension: Secondary | ICD-10-CM

## 2017-01-28 DIAGNOSIS — E782 Mixed hyperlipidemia: Secondary | ICD-10-CM

## 2017-01-28 DIAGNOSIS — E1142 Type 2 diabetes mellitus with diabetic polyneuropathy: Secondary | ICD-10-CM

## 2017-01-28 MED ORDER — METFORMIN HCL 500 MG PO TABS: 500.0000 mg | ORAL_TABLET | Freq: Two times a day (BID) | ORAL | 0 refills | Status: DC

## 2017-01-28 MED ORDER — LISINOPRIL 5 MG PO TABS: 5.0000 mg | ORAL_TABLET | Freq: Every day | ORAL | 0 refills | Status: DC

## 2017-01-28 MED ORDER — SIMVASTATIN 20 MG PO TABS: 20.0000 mg | ORAL_TABLET | Freq: Every day | ORAL | 0 refills | Status: DC

## 2017-02-24 ENCOUNTER — Encounter: Payer: Self-pay | Admitting: Nurse Practitioner

## 2017-02-24 ENCOUNTER — Ambulatory Visit (INDEPENDENT_AMBULATORY_CARE_PROVIDER_SITE_OTHER): Payer: Medicare Other

## 2017-02-24 ENCOUNTER — Ambulatory Visit (INDEPENDENT_AMBULATORY_CARE_PROVIDER_SITE_OTHER): Payer: Medicare Other | Admitting: Nurse Practitioner

## 2017-02-24 VITALS — BP 125/70 | HR 69 | Temp 97.0°F | Ht 64.0 in | Wt 145.0 lb

## 2017-02-24 DIAGNOSIS — I10 Essential (primary) hypertension: Secondary | ICD-10-CM | POA: Diagnosis not present

## 2017-02-24 DIAGNOSIS — Z13820 Encounter for screening for osteoporosis: Secondary | ICD-10-CM

## 2017-02-24 DIAGNOSIS — M810 Age-related osteoporosis without current pathological fracture: Secondary | ICD-10-CM

## 2017-02-24 DIAGNOSIS — E782 Mixed hyperlipidemia: Secondary | ICD-10-CM | POA: Diagnosis not present

## 2017-02-24 DIAGNOSIS — E1143 Type 2 diabetes mellitus with diabetic autonomic (poly)neuropathy: Secondary | ICD-10-CM

## 2017-02-24 DIAGNOSIS — E118 Type 2 diabetes mellitus with unspecified complications: Secondary | ICD-10-CM | POA: Diagnosis not present

## 2017-02-24 DIAGNOSIS — D649 Anemia, unspecified: Secondary | ICD-10-CM | POA: Diagnosis not present

## 2017-02-24 DIAGNOSIS — K219 Gastro-esophageal reflux disease without esophagitis: Secondary | ICD-10-CM

## 2017-02-24 DIAGNOSIS — E1142 Type 2 diabetes mellitus with diabetic polyneuropathy: Secondary | ICD-10-CM | POA: Diagnosis not present

## 2017-02-24 LAB — BAYER DCA HB A1C WAIVED: HB A1C (BAYER DCA - WAIVED): 6.1 % (ref <7.0)

## 2017-02-24 MED ORDER — GABAPENTIN 100 MG PO CAPS: 100.0000 mg | ORAL_CAPSULE | Freq: Three times a day (TID) | ORAL | 5 refills | Status: DC

## 2017-02-24 MED ORDER — LISINOPRIL 5 MG PO TABS: 5.0000 mg | ORAL_TABLET | Freq: Every day | ORAL | 0 refills | Status: DC

## 2017-02-24 MED ORDER — FERROUS SULFATE 325 (65 FE) MG PO TBEC: 325.0000 mg | DELAYED_RELEASE_TABLET | Freq: Every day | ORAL | 5 refills | Status: DC

## 2017-02-24 MED ORDER — METFORMIN HCL 500 MG PO TABS: 500.0000 mg | ORAL_TABLET | Freq: Two times a day (BID) | ORAL | 0 refills | Status: DC

## 2017-02-24 NOTE — Patient Instructions (Signed)
Bone Health Bones protect organs, store calcium, and anchor muscles. Good health habits, such as eating nutritious foods and exercising regularly, are important for maintaining healthy bones. They can also help to prevent a condition that causes bones to lose density and become weak and brittle (osteoporosis). Why is bone mass important? Bone mass refers to the amount of bone tissue that you have. The higher your bone mass, the stronger your bones. An important step toward having healthy bones throughout life is to have strong and dense bones during childhood. A young adult who has a high bone mass is more likely to have a high bone mass later in life. Bone mass at its greatest it is called peak bone mass. A large decline in bone mass occurs in older adults. In women, it occurs about the time of menopause. During this time, it is important to practice good health habits, because if more bone is lost than what is replaced, the bones will become less healthy and more likely to break (fracture). If you find that you have a low bone mass, you may be able to prevent osteoporosis or further bone loss by changing your diet and lifestyle. How can I find out if my bone mass is low? Bone mass can be measured with an X-ray test that is called a bone mineral density (BMD) test. This test is recommended for all women who are age 65 or older. It may also be recommended for men who are age 70 or older, or for people who are more likely to develop osteoporosis due to:  Having bones that break easily.  Having a long-term disease that weakens bones, such as kidney disease or rheumatoid arthritis.  Having menopause earlier than normal.  Taking medicine that weakens bones, such as steroids, thyroid hormones, or hormone treatment for breast cancer or prostate cancer.  Smoking.  Drinking three or more alcoholic drinks each day. What are the nutritional recommendations for healthy bones? To have healthy bones, you need  to get enough of the right minerals and vitamins. Most nutrition experts recommend getting these nutrients from the foods that you eat. Nutritional recommendations vary from person to person. Ask your health care provider what is healthy for you. Here are some general guidelines. Calcium Recommendations  Calcium is the most important (essential) mineral for bone health. Most people can get enough calcium from their diet, but supplements may be recommended for people who are at risk for osteoporosis. Good sources of calcium include:  Dairy products, such as low-fat or nonfat milk, cheese, and yogurt.  Dark green leafy vegetables, such as bok choy and broccoli.  Calcium-fortified foods, such as orange juice, cereal, bread, soy beverages, and tofu products.  Nuts, such as almonds. Follow these recommended amounts for daily calcium intake:  Children, age 1?3: 700 mg.  Children, age 4?8: 1,000 mg.  Children, age 9?13: 1,300 mg.  Teens, age 14?18: 1,300 mg.  Adults, age 19?50: 1,000 mg.  Adults, age 51?70:  Men: 1,000 mg.  Women: 1,200 mg.  Adults, age 71 or older: 1,200 mg.  Pregnant and breastfeeding females:  Teens: 1,300 mg.  Adults: 1,000 mg. Vitamin D Recommendations  Vitamin D is the most essential vitamin for bone health. It helps the body to absorb calcium. Sunlight stimulates the skin to make vitamin D, so be sure to get enough sunlight. If you live in a cold climate or you do not get outside often, your health care provider may recommend that you take vitamin D   supplements. Good sources of vitamin D in your diet include:  Egg yolks.  Saltwater fish.  Milk and cereal fortified with vitamin D. Follow these recommended amounts for daily vitamin D intake:  Children and teens, age 1?18: 600 international units.  Adults, age 50 or younger: 400-800 international units.  Adults, age 51 or older: 800-1,000 international units. Other Nutrients  Other nutrients for bone  health include:  Phosphorus. This mineral is found in meat, poultry, dairy foods, nuts, and legumes. The recommended daily intake for adult men and adult women is 700 mg.  Magnesium. This mineral is found in seeds, nuts, dark green vegetables, and legumes. The recommended daily intake for adult men is 400?420 mg. For adult women, it is 310?320 mg.  Vitamin K. This vitamin is found in green leafy vegetables. The recommended daily intake is 120 mg for adult men and 90 mg for adult women. What type of physical activity is best for building and maintaining healthy bones? Weight-bearing and strength-building activities are important for building and maintaining peak bone mass. Weight-bearing activities cause muscles and bones to work against gravity. Strength-building activities increases muscle strength that supports bones. Weight-bearing and muscle-building activities include:  Walking and hiking.  Jogging and running.  Dancing.  Gym exercises.  Lifting weights.  Tennis and racquetball.  Climbing stairs.  Aerobics. Adults should get at least 30 minutes of moderate physical activity on most days. Children should get at least 60 minutes of moderate physical activity on most days. Ask your health care provide what type of exercise is best for you. Where can I find more information? For more information, check out the following websites:  National Osteoporosis Foundation: http://nof.org/learn/basics  National Institutes of Health: http://www.niams.nih.gov/Health_Info/Bone/Bone_Health/bone_health_for_life.asp This information is not intended to replace advice given to you by your health care provider. Make sure you discuss any questions you have with your health care provider. Document Released: 01/24/2004 Document Revised: 05/23/2016 Document Reviewed: 11/08/2014 Elsevier Interactive Patient Education  2017 Elsevier Inc.  

## 2017-02-24 NOTE — Progress Notes (Signed)
Subjective:    Patient ID: Lindsay Villarreal, female    DOB: 16-Dec-1945, 71 y.o.   MRN: [id number]  HPI  Lindsay Villarreal is here today for follow up of chronic medical problem.  Outpatient Encounter Prescriptions as of 02/24/2017  Medication Sig  . aspirin 81 MG tablet Take 81 mg by mouth daily.  [name] b complex vitamins capsule Take 1 capsule by mouth daily.  . Biotin 10 MG TABS Take 4 tablets by mouth daily.  . calcium carbonate (OS-CAL) 600 MG TABS tablet Take 600 mg by mouth daily with breakfast.  . denosumab (PROLIA) 60 MG/ML SOLN injection Inject 60 mg into the skin every 6 (six) months. Administer in upper arm, thigh, or abdomen  . ferrous sulfate 325 (65 FE) MG EC tablet Take 1 tablet (325 mg total) by mouth daily.  [name] gabapentin (NEURONTIN) 100 MG capsule Take 1 capsule (100 mg total) by mouth 3 (three) times daily.  [name] lisinopril (PRINIVIL,ZESTRIL) 5 MG tablet Take 1 tablet (5 mg total) by mouth daily.  . metFORMIN (GLUCOPHAGE) 500 MG tablet Take 1 tablet (500 mg total) by mouth 2 (two) times daily.  . ONE TOUCH ULTRA TEST test strip USE TO CHECK GLUCOSE ONCE DAILY AS DIRECTED  . ONETOUCH DELICA LANCETS 93Y MISC USE TO TEST GLUCOSE EVERY DAY OR AS DIRECTED  . simvastatin (ZOCOR) 20 MG tablet Take 1 tablet (20 mg total) by mouth daily.   No facility-administered encounter medications on file as of 02/24/2017.     1. Essential hypertension  NO c/o HA,SOB or chest pain  2. Type 2 diabetes mellitus with complication, without long-term current use of insulin (HCC)  Fasting blood sugars running in 120's- denies any hypoglycemia  3. Mixed hyperlipidemia  Tries to watch fat sin diet  4. Gastroesophageal reflux disease without esophagitis  denies indigestion- does c/o frequent belching  5. Diabetic autonomic neuropathy associated with type 2 diabetes mellitus (HCC)  Denies numbness and tingling in feet right now- is on neurontin  6. Age-related osteoporosis without current pathological  fracture  On prolia-does weight bearing exercise as often as she can  7. Anemia, unspecified type  Takes iron supplement daily    New complaints: None today     Review of Systems  Constitutional: Negative for diaphoresis.  Eyes: Negative for pain.  Respiratory: Negative for shortness of breath.   Cardiovascular: Negative for chest pain, palpitations and leg swelling.  Gastrointestinal: Negative for abdominal pain.  Endocrine: Negative for polydipsia.  Skin: Negative for rash.  Neurological: Negative for dizziness, weakness and headaches.  Hematological: Does not bruise/bleed easily.       Objective:   Physical Exam  Constitutional: She is oriented to person, place, and time. She appears well-developed and well-nourished.  HENT:  Nose: Nose normal.  Mouth/Throat: Oropharynx is clear and moist.  Eyes: EOM are normal.  Neck: Trachea normal, normal range of motion and full passive range of motion without pain. Neck supple. No JVD present. Carotid bruit is not present. No thyromegaly present.  Cardiovascular: Normal rate, regular rhythm, normal heart sounds and intact distal pulses.  Exam reveals no gallop and no friction rub.   No murmur heard. Pulmonary/Chest: Effort normal and breath sounds normal.  Abdominal: Soft. Bowel sounds are normal. She exhibits no distension and no mass. There is no tenderness.  Musculoskeletal: Normal range of motion.  Lymphadenopathy:    She has no cervical adenopathy.  Neurological: She is alert and oriented to person, place, and  time. She has normal reflexes.  Skin: Skin is warm and dry.  Psychiatric: She has a normal mood and affect. Her behavior is normal. Judgment and thought content normal.   BP 125/70   Pulse 69   Temp 97 F (36.1 C) (Oral)   Ht '5\' 4"'  (1.626 m)   Wt 145 lb (65.8 kg)   BMI 24.89 kg/m   HGBA1c 6.1%     Assessment & Plan:  1. Essential hypertension The cholesterol is elevated. She should follow a low fat, low  cholesterol diet. Repeat test in 6-12 months. Sodium diet - CMP14+EGFR - lisinopril (PRINIVIL,ZESTRIL) 5 MG tablet; Take 1 tablet (5 mg total) by mouth daily.  Dispense: 90 tablet; Refill: 0  2. Type 2 diabetes mellitus with complication, without long-term current use of insulin (HCC) Continue to watch carbs in diet - Bayer DCA Hb A1c Waived- metFORMIN (GLUCOPHAGE) 500 MG tablet; Take 1 tablet (500 mg total) by mouth 2 (two) times daily.  Dispense: 180 tablet; Refill: 0  3. Mixed hyperlipidemia Low fat diet - Lipid panel  4. Gastroesophageal reflux disease without esophagitis Avoid spicy foods Do not eat 2 hours prior to bedtime  5. Diabetic autonomic neuropathy associated with type 2 diabetes mellitus ([place]) Do not go barefooted - gabapentin (NEURONTIN) 100 MG capsule; Take 1 capsule (100 mg total) by mouth 3 (three) times daily.  Dispense: 90 capsule; Refill: 5  6. Age-related osteoporosis without current pathological fracture Weight bearing exercises  7. Anemia, unspecified type - ferrous sulfate 325 (65 FE) MG EC tablet; Take 1 tablet (325 mg total) by mouth daily.  Dispense: 30 tablet; Refill: 5     Patient to schedule eye exam Labs pending Health maintenance reviewed Diet and exercise encouraged Continue all meds Follow up  In 3 months   [place], FNP

## 2017-02-25 LAB — CMP14+EGFR
ALT: 15 IU/L (ref 0–32)
AST: 16 IU/L (ref 0–40)
Albumin/Globulin Ratio: 2 (ref 1.2–2.2)
Albumin: 4.2 g/dL (ref 3.5–4.8)
Alkaline Phosphatase: 47 IU/L (ref 39–117)
BUN/Creatinine Ratio: 23 (ref 12–28)
BUN: 15 mg/dL (ref 8–27)
Bilirubin Total: 0.3 mg/dL (ref 0.0–1.2)
CO2: 26 mmol/L (ref 18–29)
Calcium: 9.2 mg/dL (ref 8.7–10.3)
Chloride: 101 mmol/L (ref 96–106)
Creatinine, Ser: 0.64 mg/dL (ref 0.57–1.00)
GFR calc Af Amer: 104 mL/min/1.73 (ref >59)
GFR calc non Af Amer: 90 mL/min/1.73 (ref >59)
Globulin, Total: 2.1 g/dL (ref 1.5–4.5)
Glucose: 110 mg/dL — ABNORMAL HIGH (ref 65–99)
Potassium: 3.9 mmol/L (ref 3.5–5.2)
Sodium: 141 mmol/L (ref 134–144)
Total Protein: 6.3 g/dL (ref 6.0–8.5)

## 2017-02-25 LAB — LIPID PANEL
CHOL/HDL RATIO: 2.7 ratio (ref 0.0–4.4)
Cholesterol, Total: 112 mg/dL (ref 100–199)
HDL: 41 mg/dL (ref >39)
LDL Calculated: 33 mg/dL (ref 0–99)
Triglycerides: 191 mg/dL — ABNORMAL HIGH (ref 0–149)
VLDL CHOLESTEROL CAL: 38 mg/dL (ref 5–40)

## 2017-03-05 ENCOUNTER — Ambulatory Visit: Payer: Self-pay | Admitting: Pharmacist

## 2017-03-09 ENCOUNTER — Encounter: Payer: Self-pay | Admitting: Pharmacist

## 2017-03-09 ENCOUNTER — Ambulatory Visit (INDEPENDENT_AMBULATORY_CARE_PROVIDER_SITE_OTHER): Payer: Medicare Other | Admitting: Pharmacist

## 2017-03-09 VITALS — BP 126/70 | HR 66 | Ht 64.5 in | Wt 145.0 lb

## 2017-03-09 DIAGNOSIS — Z87891 Personal history of nicotine dependence: Secondary | ICD-10-CM

## 2017-03-09 DIAGNOSIS — M810 Age-related osteoporosis without current pathological fracture: Secondary | ICD-10-CM | POA: Diagnosis not present

## 2017-03-09 DIAGNOSIS — Z0000 Encounter for general adult medical examination without abnormal findings: Secondary | ICD-10-CM | POA: Diagnosis not present

## 2017-03-09 MED ORDER — DENOSUMAB 60 MG/ML ~~LOC~~ SOLN
60.0000 mg | Freq: Once | SUBCUTANEOUS | Status: AC
  Administered 2017-03-09: 60 mg via SUBCUTANEOUS

## 2017-03-09 NOTE — Patient Instructions (Addendum)
  Lindsay Villarreal , Thank you for taking time to come for your Medicare Wellness Visit. I appreciate your ongoing commitment to your health goals. Please review the following plan we discussed and let me know if I can assist you in the future.   These are the goals we discussed:  Remember to make appointment with Dr Marin Comment / [facility] to have diabetic eye exam - [phone]  I have sent a referral for Lung CT / Lung cancer screening to [facility]  - you should receive a call to schedule within 7 days.  Increase non-starchy vegetables - carrots, green bean, squash, zucchini, tomatoes, onions, peppers, spinach and other green leafy vegetables, cabbage, lettuce, cucumbers, asparagus, okra (not fried), eggplant Limit sugar and processed foods (cakes, cookies, ice cream, crackers and chips) Increase fresh fruit but limit serving sizes 1/2 cup or about the size of tennis or baseball Limit red meat to no more than 1-2 times per week (serving size about the size of your palm) Choose whole grains and lean proteins - whole wheat bread, quinoa, whole grain rice (1/2 cup), fish, chicken, [place] Avoid sugar and calorie containing beverages - soda, sweet tea and juice.  Choose water or unsweetened tea instead.    This is a list of the screening recommended for you and due dates:  Health Maintenance  Topic Date Due  . Eye exam for diabetics  07/30/2016  . Stool Blood Test  02/26/2017  . Flu Shot  06/17/2017  . Lipid (cholesterol) test  08/26/2017  . Hemoglobin A1C  08/26/2017  . Complete foot exam   10/27/2017  . Mammogram  06/16/2018  . DEXA scan (bone density measurement)  02/25/2019  . Colon Cancer Screening  04/29/2020  . Tetanus Vaccine  07/30/2022  .  Hepatitis C: One time screening is recommended by Center for Disease Control  (CDC) for  adults born from [birth year] through 1965.   Completed  . Pneumonia vaccines  Completed

## 2017-03-09 NOTE — Progress Notes (Signed)
Subjective:   Lindsay Villarreal is a 71 y.o. female who presents for a subsequant Medicare Annual Wellness Visit.  Lindsay Villarreal is divorced for over 20 years.  She lives alone.  She has 3 grown children who live close by.  She also has 6 grandchildren and she is very involved in helping care for them.  She keeps her 2.5 yo grandson about 2 days per week and also helps with her older grandson by taking him to school and picking him up.  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   [name] mentions that she has a steroid injection in there left knee about 6 weeks ago and that the pain has improved greatly.  She reports that she is back to normal activity.  Current Medications (verified) Outpatient Encounter Prescriptions as of 03/09/2017  Medication Sig  . aspirin 81 MG tablet Take 81 mg by mouth daily.  [name] b complex vitamins capsule Take 1 capsule by mouth daily.  . Biotin 10 MG TABS Take 4 tablets by mouth daily.  . calcium carbonate (OS-CAL) 600 MG TABS tablet Take 600 mg by mouth daily with breakfast.  . denosumab (PROLIA) 60 MG/ML SOLN injection Inject 60 mg into the skin every 6 (six) months. Administer in upper arm, thigh, or abdomen  . ferrous sulfate 325 (65 FE) MG EC tablet Take 1 tablet (325 mg total) by mouth daily.  [name] gabapentin (NEURONTIN) 100 MG capsule Take 1 capsule (100 mg total) by mouth 3 (three) times daily.  [name] lisinopril (PRINIVIL,ZESTRIL) 5 MG tablet Take 1 tablet (5 mg total) by mouth daily.  . metFORMIN (GLUCOPHAGE) 500 MG tablet Take 1 tablet (500 mg total) by mouth 2 (two) times daily.  . ONE TOUCH ULTRA TEST test strip USE TO CHECK GLUCOSE ONCE DAILY AS DIRECTED  . ONETOUCH DELICA LANCETS 63K MISC USE TO TEST GLUCOSE EVERY DAY OR AS DIRECTED  . simvastatin (ZOCOR) 20 MG tablet Take 1 tablet (20 mg total) by mouth daily.   Facility-Administered Encounter Medications as of 03/09/2017  Medication  . denosumab (PROLIA) injection 60  mg    Allergies (verified) Codeine   History: Past Medical History:  Diagnosis Date  . Anemia   . Arthritis   . Cataract    bil cateracts removed  . Diabetes mellitus without complication ([place])   . GERD (gastroesophageal reflux disease)   . Heart murmur   . Hyperlipidemia   . Neuromuscular disorder ([place])   . Osteoporosis    Past Surgical History:  Procedure Laterality Date  . APPENDECTOMY  1978  . BLADDER REPAIR    . CATARACT EXTRACTION W/ INTRAOCULAR LENS IMPLANT  2010 (left), 2013 (right)  . COLONOSCOPY    . EYE SURGERY    . POLYPECTOMY    . TUBAL LIGATION    . VAGINAL HYSTERECTOMY  1978   Family History  Problem Relation Age of Onset  . Stomach cancer Father   . Pancreatic cancer Father   . Liver cancer Father   . Other Sister 69    Cancer of Bile Duct  . Breast cancer Sister   . Diabetes Sister   . Colon cancer Neg Hx   . Esophageal cancer Neg Hx   . Rectal cancer Neg Hx   . Brain cancer Sister 92  . Heart attack Mother   . Heart failure Mother   . Sarcoidosis Sister    Social History   Occupational History  .  Not on file.   Social History Main Topics  . Smoking status: Current Every Day Smoker    Packs/day: 0.50  . Smokeless tobacco: Never Used  . Alcohol use No  . Drug use: No  . Sexual activity: No    Do you feel safe at home?  Yes  Dietary issues and exercise activities: Current Exercise Habits: The patient does not participate in regular exercise at present (keeps 2.5 yo grandchild and is very active)  Current Dietary habits:  Not follow up CHO counting diet at this time and admits to eating more sweets than she is suppose to.  Objective:    Today's Vitals   03/09/17 0926  BP: 126/70  Pulse: 66  Weight: 145 lb (65.8 kg)  Height: 5' 4.5" (1.638 m)  PainSc: 0-No pain   Body mass index is 24.5 kg/m.  Activities of Daily Living In your present state of health, do you have any difficulty performing the following activities:  03/09/2017  Hearing? N  Vision? N  Difficulty concentrating or making decisions? N  Walking or climbing stairs? N  Dressing or bathing? N  Doing errands, shopping? N  Preparing Food and eating ? N  Using the Toilet? N  In the past six months, have you accidently leaked urine? N  Do you have problems with loss of bowel control? N  Managing your Medications? N  Managing your Finances? N  Housekeeping or managing your Housekeeping? N  Some recent data might be hidden     Cardiac Risk Factors include: advanced age (>70men, >57 women);diabetes mellitus;dyslipidemia;sedentary lifestyle;smoking/ tobacco exposure DEXA Results Date of Test T-Score for AP Spine L1-L3 T-Score for Total Left Hip T-Score for Total Right Hip  02/24/2017 -2.5 -0.9 -0.7  01/10/2015 -2.6 -0.5 -0.5  12/01/2012 -3.1 -0.8 -0.6  07/25/2009 -2.8 -0.6 -0.8         Depression Screen PHQ 2/9 Scores 03/09/2017 02/24/2017 01/27/2017 10/27/2016  PHQ - 2 Score 0 0 0 0     Fall Risk Fall Risk  03/09/2017 02/24/2017 01/27/2017 10/27/2016 05/22/2016  Falls in the past year? Yes Yes Yes No No  Number falls in past yr: 1 1 1  - -  Injury with Fall? No No No - -  Follow up Falls prevention discussed - - - -    Cognitive Function: MMSE - Mini Mental State Exam 03/09/2017 02/18/2016 02/12/2015  Orientation to time 4 5 5   Orientation to Place 5 5 5   Registration 3 3 3   Attention/ Calculation 5 5 5   Recall 3 3 3   Language- name 2 objects 2 2 2   Language- repeat 1 1 1   Language- follow 3 step command 3 3 3   Language- read & follow direction 1 1 1   Write a sentence 1 1 1   Copy design 1 1 1   Total score 29 30 30     Immunizations and Health Maintenance Immunization History  Administered Date(s) Administered  . Influenza, High Dose Seasonal PF 10/04/2015, 09/04/2016  . Influenza,inj,Quad PF,36+ Mos 09/09/2013, 08/14/2014  . Pneumococcal Conjugate-13 10/30/2014  . Pneumococcal Polysaccharide-23 07/30/2012  . Tdap 07/30/2012    Health Maintenance Due  Topic Date Due  . OPHTHALMOLOGY EXAM  07/30/2016  . COLON CANCER SCREENING ANNUAL FOBT  02/26/2017    Patient Care Team: [name], FNP as PCP - General (Nurse Practitioner) Okey Regal, [place] as Consulting Physician (Optometry) Annia Belt, MD as Consulting Physician (Hematology) Irene Shipper, MD as Consulting Physician (Gastroenterology)  Indicate any recent  Medical Services you may have received from other than [facility] providers in the past year (date may be approximate).    Assessment:    Annual Wellness Visit  Osteoporosis with improving BMD with Prolia  Screening Tests Health Maintenance  Topic Date Due  . OPHTHALMOLOGY EXAM  07/30/2016  . COLON CANCER SCREENING ANNUAL FOBT  02/26/2017  . INFLUENZA VACCINE  06/17/2017  . LIPID PANEL  08/26/2017  . HEMOGLOBIN A1C  08/26/2017  . FOOT EXAM  10/27/2017  . MAMMOGRAM  06/16/2018  . DEXA SCAN  02/25/2019  . COLONOSCOPY  04/29/2020  . TETANUS/TDAP  07/30/2022  . Hepatitis C Screening  Completed  . PNA vac Low Risk Adult  Completed        Plan:   During the course of the visit Abbeygail was educated and counseled about the following appropriate screening and preventive services:   Vaccines to include Pneumoccal, Influenza, Td and Shingles - due shingles vaccine but cost verified today was $171 and patient declined due to cost.  Colorectal cancer screening - Colonoscopy is UTD  Cardiovascular disease screening - EKG is UTD; BP at goal  Lipids - LDL at goal and patient is taking statin.  Tg still elevated - diet discussed.  Diabetes - last A1c was at goal 6.1%  Bone Denisty / Osteoporosis Screening - Done 02/2017, next 02/2019  Prolia 60mg  administered SQ in left arm today  Continue calcium and vitamin D intake  Mammogram - done 05/2016  PAP - no longer required  Glaucoma screening / Diabetic Eye Exam  Nutrition counseling - discussed limiting sugar intake.  Increase  fresh fruit instead of cookies and cake.  Limit bread and potatoes / other starchy vegetables.  Increase whole grains, non starchy vegetables and lean proteins.  Smoking cessation counseling - patient declined  Advanced Directives - handout given in office today and discussed.  Physical Activity - increase as able, start with 10 minutes a day and increase as able to 30 minutes.  Suggested swimming / water aerobics when she takes her grandchildren to pool in summer.   Smoker for over 50 years.  CT of Lungs orders    Patient Instructions (the written plan) were given to the patient.   Cherre Robins, PharmD   03/09/2017

## 2017-03-23 ENCOUNTER — Other Ambulatory Visit: Payer: Self-pay | Admitting: Nurse Practitioner

## 2017-03-23 ENCOUNTER — Ambulatory Visit (HOSPITAL_COMMUNITY)
Admission: RE | Admit: 2017-03-23 | Discharge: 2017-03-23 | Disposition: A | Discharge status: Home / Self Care | Payer: Medicare Other | Source: Ambulatory Visit | Attending: Nurse Practitioner | Admitting: Nurse Practitioner

## 2017-03-23 DIAGNOSIS — I2510 Atherosclerotic heart disease of native coronary artery without angina pectoris: Secondary | ICD-10-CM | POA: Insufficient documentation

## 2017-03-23 DIAGNOSIS — Z122 Encounter for screening for malignant neoplasm of respiratory organs: Secondary | ICD-10-CM | POA: Insufficient documentation

## 2017-03-23 DIAGNOSIS — I700 Atherosclerosis of aorta: Secondary | ICD-10-CM | POA: Diagnosis not present

## 2017-03-23 DIAGNOSIS — J432 Centrilobular emphysema: Secondary | ICD-10-CM | POA: Diagnosis not present

## 2017-03-23 DIAGNOSIS — F17210 Nicotine dependence, cigarettes, uncomplicated: Secondary | ICD-10-CM | POA: Diagnosis not present

## 2017-07-03 ENCOUNTER — Encounter: Payer: Self-pay | Admitting: Nurse Practitioner

## 2017-07-03 ENCOUNTER — Ambulatory Visit (INDEPENDENT_AMBULATORY_CARE_PROVIDER_SITE_OTHER): Payer: Medicare Other | Admitting: Nurse Practitioner

## 2017-07-03 VITALS — BP 120/70 | HR 61 | Temp 97.1°F | Ht 64.0 in | Wt 142.0 lb

## 2017-07-03 DIAGNOSIS — I10 Essential (primary) hypertension: Secondary | ICD-10-CM | POA: Diagnosis not present

## 2017-07-03 DIAGNOSIS — E1143 Type 2 diabetes mellitus with diabetic autonomic (poly)neuropathy: Secondary | ICD-10-CM

## 2017-07-03 DIAGNOSIS — E782 Mixed hyperlipidemia: Secondary | ICD-10-CM

## 2017-07-03 DIAGNOSIS — E118 Type 2 diabetes mellitus with unspecified complications: Secondary | ICD-10-CM | POA: Diagnosis not present

## 2017-07-03 DIAGNOSIS — M810 Age-related osteoporosis without current pathological fracture: Secondary | ICD-10-CM | POA: Diagnosis not present

## 2017-07-03 DIAGNOSIS — K219 Gastro-esophageal reflux disease without esophagitis: Secondary | ICD-10-CM

## 2017-07-03 DIAGNOSIS — D649 Anemia, unspecified: Secondary | ICD-10-CM

## 2017-07-03 LAB — CMP14+EGFR
ALBUMIN: 4.3 g/dL (ref 3.5–4.8)
ALK PHOS: 45 IU/L (ref 39–117)
ALT: 12 IU/L (ref 0–32)
AST: 13 IU/L (ref 0–40)
Albumin/Globulin Ratio: 2 (ref 1.2–2.2)
BILIRUBIN TOTAL: 0.3 mg/dL (ref 0.0–1.2)
BUN / CREAT RATIO: 23 (ref 12–28)
BUN: 16 mg/dL (ref 8–27)
CO2: 27 mmol/L (ref 20–29)
Calcium: 9.6 mg/dL (ref 8.7–10.3)
Chloride: 100 mmol/L (ref 96–106)
Creatinine, Ser: 0.7 mg/dL (ref 0.57–1.00)
GFR calc non Af Amer: 87 mL/min/{1.73_m2} (ref >59)
GFR, EST AFRICAN AMERICAN: 101 mL/min/{1.73_m2} (ref >59)
GLOBULIN, TOTAL: 2.1 g/dL (ref 1.5–4.5)
Glucose: 110 mg/dL — ABNORMAL HIGH (ref 65–99)
Potassium: 4.5 mmol/L (ref 3.5–5.2)
SODIUM: 139 mmol/L (ref 134–144)
TOTAL PROTEIN: 6.4 g/dL (ref 6.0–8.5)

## 2017-07-03 LAB — LIPID PANEL
CHOLESTEROL TOTAL: 117 mg/dL (ref 100–199)
Chol/HDL Ratio: 2.8 ratio (ref 0.0–4.4)
HDL: 42 mg/dL (ref >39)
LDL CALC: 50 mg/dL (ref 0–99)
Triglycerides: 124 mg/dL (ref 0–149)
VLDL Cholesterol Cal: 25 mg/dL (ref 5–40)

## 2017-07-03 LAB — BAYER DCA HB A1C WAIVED: HB A1C (BAYER DCA - WAIVED): 6 % (ref <7.0)

## 2017-07-03 NOTE — Patient Instructions (Signed)

## 2017-07-03 NOTE — Progress Notes (Addendum)
Subjective:    Patient ID: Lindsay Villarreal, female    DOB: [birth year], 71 y.o.   MRN: [id number]  HPI   Lindsay Villarreal is here today for follow up of chronic medical problem.  Outpatient Encounter Prescriptions as of 07/03/2017  Medication Sig  . aspirin 81 MG tablet Take 81 mg by mouth daily.  [name] b complex vitamins capsule Take 1 capsule by mouth daily.  . Biotin 10 MG TABS Take 4 tablets by mouth daily.  . calcium carbonate (OS-CAL) 600 MG TABS tablet Take 600 mg by mouth daily with breakfast.  . denosumab (PROLIA) 60 MG/ML SOLN injection Inject 60 mg into the skin every 6 (six) months. Administer in upper arm, thigh, or abdomen  . ferrous sulfate 325 (65 FE) MG EC tablet Take 1 tablet (325 mg total) by mouth daily.  [name] gabapentin (NEURONTIN) 100 MG capsule Take 1 capsule (100 mg total) by mouth 3 (three) times daily.  [name] lisinopril (PRINIVIL,ZESTRIL) 5 MG tablet Take 1 tablet (5 mg total) by mouth daily.  . metFORMIN (GLUCOPHAGE) 500 MG tablet Take 1 tablet (500 mg total) by mouth 2 (two) times daily.  . ONE TOUCH ULTRA TEST test strip USE TO CHECK GLUCOSE ONCE DAILY AS DIRECTED  . ONETOUCH DELICA LANCETS 62X MISC USE TO TEST GLUCOSE EVERY DAY OR AS DIRECTED  . simvastatin (ZOCOR) 20 MG tablet Take 1 tablet (20 mg total) by mouth daily.   No facility-administered encounter medications on file as of 07/03/2017.     1. Type 2 diabetes mellitus with complication, without long-term current use of insulin (HCC) Last hgba1c was 6.1%. No changes to blood sugars since last visit. Denies hypoglycemia   2. Mixed hyperlipidemia  tries to avoid a lot of fried foods  3. Essential hypertension  No c/o chest pain,sob or headache. doie snot check blood pressure at home  4. Gastroesophageal reflux disease without esophagitis  Uses otc meds as needed  5. Diabetic autonomic neuropathy associated with type 2 diabetes mellitus ([place])  deneie any problem with feet other then the numbness and tingling  6.  Age-related osteoporosis without current pathological fracture  Tries to do weight bearing exercises  7  Anemia, unspecified type  Takes iron OTC daily    New complaints: None today  Social history:    Review of Systems  Constitutional: Negative for activity change and appetite change.  HENT: Negative.   Eyes: Negative for pain.  Respiratory: Negative for shortness of breath.   Cardiovascular: Negative for chest pain, palpitations and leg swelling.  Gastrointestinal: Negative for abdominal pain.  Endocrine: Negative for polydipsia.  Genitourinary: Negative.   Skin: Negative for rash.  Neurological: Negative for dizziness, weakness and headaches.  Hematological: Does not bruise/bleed easily.  Psychiatric/Behavioral: Negative.   All other systems reviewed and are negative.      Objective:   Physical Exam  Constitutional: She is oriented to person, place, and time. She appears well-developed and well-nourished.  HENT:  Nose: Nose normal.  Mouth/Throat: Oropharynx is clear and moist.  Eyes: EOM are normal.  Neck: Trachea normal, normal range of motion and full passive range of motion without pain. Neck supple. No JVD present. Carotid bruit is not present. No thyromegaly present.  Cardiovascular: Normal rate, regular rhythm and intact distal pulses.  Exam reveals no gallop and no friction rub.   Murmur (2/6 systolic murmur) heard. Pulmonary/Chest: Effort normal and breath sounds normal.  Abdominal: Soft. Bowel sounds are normal. She exhibits no  distension and no mass. There is no tenderness.  Musculoskeletal: Normal range of motion.  Lymphadenopathy:    She has no cervical adenopathy.  Neurological: She is alert and oriented to person, place, and time. She has normal reflexes.  Skin: Skin is warm and dry.  Psychiatric: She has a normal mood and affect. Her behavior is normal. Judgment and thought content normal.   BP 120/70   Pulse 61   Temp (!) 97.1 F (36.2 C) (Oral)    Ht '5\' 4"'  (1.626 m)   Wt 142 lb (64.4 kg)   BMI 24.37 kg/m      Assessment & Plan:  1. Type 2 diabetes mellitus with complication, without long-term current use of insulin (HCC) Continue to watch carbs in diet - Bayer DCA Hb A1c Waived  2. Mixed hyperlipidemia Low fat diet - Lipid panel  3. Essential hypertension Do not add salt to diet - CMP14+EGFR  4. Gastroesophageal reflux disease without esophagitis Avoid spicy foods Do not eat 2 hours prior to bedtime  5. Diabetic autonomic neuropathy associated with type 2 diabetes mellitus ([place]) Do not go barefooted  6. Age-related osteoporosis without current pathological fracture Weight bearing exercises encouraged  7. Anemia, unspecified type Continue iron supplements OTC   Patient will schedule mammogram and eye exam Labs pending Health maintenance reviewed Diet and exercise encouraged Continue all meds Follow up  In 6 months   [place], FNP

## 2017-07-15 DIAGNOSIS — Z1231 Encounter for screening mammogram for malignant neoplasm of breast: Secondary | ICD-10-CM | POA: Diagnosis not present

## 2017-07-23 DIAGNOSIS — Z961 Presence of intraocular lens: Secondary | ICD-10-CM | POA: Diagnosis not present

## 2017-07-23 DIAGNOSIS — H04123 Dry eye syndrome of bilateral lacrimal glands: Secondary | ICD-10-CM | POA: Diagnosis not present

## 2017-07-23 DIAGNOSIS — E119 Type 2 diabetes mellitus without complications: Secondary | ICD-10-CM | POA: Diagnosis not present

## 2017-07-23 LAB — HM DIABETES EYE EXAM

## 2017-09-17 ENCOUNTER — Ambulatory Visit (INDEPENDENT_AMBULATORY_CARE_PROVIDER_SITE_OTHER): Payer: Medicare Other

## 2017-09-17 DIAGNOSIS — Z23 Encounter for immunization: Secondary | ICD-10-CM

## 2017-10-05 ENCOUNTER — Encounter: Payer: Self-pay | Admitting: Nurse Practitioner

## 2017-10-05 ENCOUNTER — Ambulatory Visit (INDEPENDENT_AMBULATORY_CARE_PROVIDER_SITE_OTHER): Payer: Medicare Other | Admitting: Nurse Practitioner

## 2017-10-05 VITALS — BP 119/70 | HR 68 | Temp 96.9°F | Ht 64.0 in | Wt 145.0 lb

## 2017-10-05 DIAGNOSIS — D649 Anemia, unspecified: Secondary | ICD-10-CM | POA: Diagnosis not present

## 2017-10-05 DIAGNOSIS — E782 Mixed hyperlipidemia: Secondary | ICD-10-CM

## 2017-10-05 DIAGNOSIS — K219 Gastro-esophageal reflux disease without esophagitis: Secondary | ICD-10-CM | POA: Diagnosis not present

## 2017-10-05 DIAGNOSIS — I10 Essential (primary) hypertension: Secondary | ICD-10-CM | POA: Diagnosis not present

## 2017-10-05 DIAGNOSIS — E1142 Type 2 diabetes mellitus with diabetic polyneuropathy: Secondary | ICD-10-CM

## 2017-10-05 DIAGNOSIS — M810 Age-related osteoporosis without current pathological fracture: Secondary | ICD-10-CM

## 2017-10-05 DIAGNOSIS — E118 Type 2 diabetes mellitus with unspecified complications: Secondary | ICD-10-CM | POA: Diagnosis not present

## 2017-10-05 DIAGNOSIS — E1143 Type 2 diabetes mellitus with diabetic autonomic (poly)neuropathy: Secondary | ICD-10-CM

## 2017-10-05 LAB — CMP14+EGFR
ALT: 12 IU/L (ref 0–32)
AST: 14 IU/L (ref 0–40)
Albumin/Globulin Ratio: 1.9 (ref 1.2–2.2)
Albumin: 4.1 g/dL (ref 3.5–4.8)
Alkaline Phosphatase: 58 IU/L (ref 39–117)
BILIRUBIN TOTAL: 0.3 mg/dL (ref 0.0–1.2)
BUN / CREAT RATIO: 24 (ref 12–28)
BUN: 16 mg/dL (ref 8–27)
CO2: 27 mmol/L (ref 20–29)
CREATININE: 0.67 mg/dL (ref 0.57–1.00)
Calcium: 10.2 mg/dL (ref 8.7–10.3)
Chloride: 100 mmol/L (ref 96–106)
GFR calc non Af Amer: 89 mL/min/{1.73_m2} (ref >59)
GFR, EST AFRICAN AMERICAN: 102 mL/min/{1.73_m2} (ref >59)
Globulin, Total: 2.2 g/dL (ref 1.5–4.5)
Glucose: 105 mg/dL — ABNORMAL HIGH (ref 65–99)
Potassium: 4.3 mmol/L (ref 3.5–5.2)
Sodium: 142 mmol/L (ref 134–144)
Total Protein: 6.3 g/dL (ref 6.0–8.5)

## 2017-10-05 LAB — LIPID PANEL
CHOL/HDL RATIO: 3 ratio (ref 0.0–4.4)
Cholesterol, Total: 126 mg/dL (ref 100–199)
HDL: 42 mg/dL (ref >39)
LDL CALC: 54 mg/dL (ref 0–99)
Triglycerides: 149 mg/dL (ref 0–149)
VLDL Cholesterol Cal: 30 mg/dL (ref 5–40)

## 2017-10-05 LAB — BAYER DCA HB A1C WAIVED: HB A1C (BAYER DCA - WAIVED): 5.8 % (ref <7.0)

## 2017-10-05 MED ORDER — SIMVASTATIN 20 MG PO TABS: 20.0000 mg | ORAL_TABLET | Freq: Every day | ORAL | 0 refills | Status: DC

## 2017-10-05 MED ORDER — METFORMIN HCL 500 MG PO TABS: 500.0000 mg | ORAL_TABLET | Freq: Two times a day (BID) | ORAL | 0 refills | Status: DC

## 2017-10-05 MED ORDER — LISINOPRIL 5 MG PO TABS: 5.0000 mg | ORAL_TABLET | Freq: Every day | ORAL | 0 refills | Status: DC

## 2017-10-05 MED ORDER — FERROUS SULFATE 325 (65 FE) MG PO TBEC: 325.0000 mg | DELAYED_RELEASE_TABLET | Freq: Every day | ORAL | 5 refills | Status: DC

## 2017-10-05 MED ORDER — GABAPENTIN 100 MG PO CAPS: 100.0000 mg | ORAL_CAPSULE | Freq: Three times a day (TID) | ORAL | 5 refills | Status: DC

## 2017-10-05 NOTE — Progress Notes (Signed)
Subjective:    Patient ID: Lindsay Villarreal, female    DOB: 1946-06-04, 71 y.o.   MRN: [id number]  HPI   Lindsay Villarreal is here today for follow up of chronic medical problem.  Outpatient Encounter Medications as of 10/05/2017  Medication Sig  . aspirin 81 MG tablet Take 81 mg by mouth daily.  [name] b complex vitamins capsule Take 1 capsule by mouth daily.  . Biotin 10 MG TABS Take 4 tablets by mouth daily.  . calcium carbonate (OS-CAL) 600 MG TABS tablet Take 600 mg by mouth daily with breakfast.  . denosumab (PROLIA) 60 MG/ML SOLN injection Inject 60 mg into the skin every 6 (six) months. Administer in upper arm, thigh, or abdomen  . ferrous sulfate 325 (65 FE) MG EC tablet Take 1 tablet (325 mg total) by mouth daily.  [name] gabapentin (NEURONTIN) 100 MG capsule Take 1 capsule (100 mg total) by mouth 3 (three) times daily.  [name] lisinopril (PRINIVIL,ZESTRIL) 5 MG tablet Take 1 tablet (5 mg total) by mouth daily.  . metFORMIN (GLUCOPHAGE) 500 MG tablet Take 1 tablet (500 mg total) by mouth 2 (two) times daily.  . ONE TOUCH ULTRA TEST test strip USE TO CHECK GLUCOSE ONCE DAILY AS DIRECTED  . ONETOUCH DELICA LANCETS 81W MISC USE TO TEST GLUCOSE EVERY DAY OR AS DIRECTED  . simvastatin (ZOCOR) 20 MG tablet Take 1 tablet (20 mg total) by mouth daily.   No facility-administered encounter medications on file as of 10/05/2017.     1. Essential hypertension  No c/o chest pain, sob or headache. Does not check blood pressure at home. BP Readings from Last 3 Encounters:  10/05/17 119/70  07/03/17 120/70  03/09/17 126/70     2. Type 2 diabetes mellitus with complication, without long-term current use of insulin ([place])  last hgba1c was 6.0%. Does not check blood sugar everyday. checks about 2x a week. Running below 120 consistently.  3. Mixed hyperlipidemia  Not really watching diet  4. Gastroesophageal reflux disease without esophagitis  Only takes otc meds as needed  5. Age-related osteoporosis  without current pathological fracture  No c/o back pain- does some weight bearing exercises.  6. Anemia, unspecified type  Needs  Labs checked today  7. Diabetic autonomic neuropathy associated with type 2 diabetes mellitus (HCC) constant burning and tingling in feet . She is on neurotin which helps.    New complaints: None today  Social history: Patient lives by herself.   Review of Systems  Constitutional: Negative for activity change and appetite change.  HENT: Negative.   Eyes: Negative for pain.  Respiratory: Negative for shortness of breath.   Cardiovascular: Negative for chest pain, palpitations and leg swelling.  Gastrointestinal: Negative for abdominal pain.  Endocrine: Negative for polydipsia.  Genitourinary: Negative.   Skin: Negative for rash.  Neurological: Negative for dizziness, weakness and headaches.  Hematological: Does not bruise/bleed easily.  Psychiatric/Behavioral: Negative.   All other systems reviewed and are negative.      Objective:   Physical Exam  Constitutional: She is oriented to person, place, and time. She appears well-developed and well-nourished.  HENT:  Nose: Nose normal.  Mouth/Throat: Oropharynx is clear and moist.  Eyes: EOM are normal.  Neck: Trachea normal, normal range of motion and full passive range of motion without pain. Neck supple. No JVD present. Carotid bruit is not present. No thyromegaly present.  Cardiovascular: Normal rate, regular rhythm, normal heart sounds and intact distal pulses. Exam reveals  no gallop and no friction rub.  No murmur heard. Pulmonary/Chest: Effort normal and breath sounds normal.  Abdominal: Soft. Bowel sounds are normal. She exhibits no distension and no mass. There is no tenderness.  Musculoskeletal: Normal range of motion.  Lymphadenopathy:    She has no cervical adenopathy.  Neurological: She is alert and oriented to person, place, and time. She has normal reflexes.  Skin: Skin is warm and  dry.  Psychiatric: She has a normal mood and affect. Her behavior is normal. Judgment and thought content normal.   BP 119/70   Pulse 68   Temp (!) 96.9 F (36.1 C) (Oral)   Ht _0  (1.626 m)   Wt 145 lb (65.8 kg)   BMI 24.89 kg/m   hgba1c 5.8%     Assessment & Plan:  1. Essential hypertension Low sodium diet - CMP14+EGFR - lisinopril (PRINIVIL,ZESTRIL) 5 MG tablet; Take 1 tablet (5 mg total) daily by mouth.  Dispense: 90 tablet; Refill: 0  2. Type 2 diabetes mellitus with complication, without long-term current use of insulin (HCC) Continue to watch carbs in diet - Bayer DCA Hb A1c Waived - Microalbumin / creatinine urine ratio  3. Mixed hyperlipidemia Low fat diet - Lipid panel - simvastatin (ZOCOR) 20 MG tablet; Take 1 tablet (20 mg total) daily by mouth.  Dispense: 90 tablet; Refill: 0  4. Gastroesophageal reflux disease without esophagitis Avoid spicy foods Do not eat 2 hours prior to bedtime   5. Age-related osteoporosis without current pathological fracture Weight bearing exercise  6. Anemia, unspecified type - ferrous sulfate 325 (65 FE) MG EC tablet; Take 1 tablet (325 mg total) daily by mouth.  Dispense: 30 tablet; Refill: 5  7. Diabetic autonomic neuropathy associated with type 2 diabetes mellitus ([place]) Do not go barefooted - gabapentin (NEURONTIN) 100 MG capsule; Take 1 capsule (100 mg total) 3 (three) times daily by mouth.  Dispense: 90 capsule; Refill: 5  8. Type 2 diabetes mellitus with diabetic polyneuropathy, without long-term current use of insulin (HCC) - metFORMIN (GLUCOPHAGE) 500 MG tablet; Take 1 tablet (500 mg total) 2 (two) times daily by mouth.  Dispense: 180 tablet; Refill: 0    Labs pending Health maintenance reviewed Diet and exercise encouraged Continue all meds Follow up  In 3 months   [address] [address], FNP

## 2017-10-05 NOTE — Patient Instructions (Signed)

## 2017-10-06 LAB — MICROALBUMIN / CREATININE URINE RATIO
Creatinine, Urine: 95.8 mg/dL
MICROALB/CREAT RATIO: 9.1 mg/g{creat} (ref 0.0–30.0)
MICROALBUM., U, RANDOM: 8.7 ug/mL

## 2017-11-05 ENCOUNTER — Ambulatory Visit (INDEPENDENT_AMBULATORY_CARE_PROVIDER_SITE_OTHER): Payer: Medicare Other | Admitting: *Deleted

## 2017-11-05 DIAGNOSIS — M810 Age-related osteoporosis without current pathological fracture: Secondary | ICD-10-CM

## 2017-11-05 MED ORDER — DENOSUMAB 60 MG/ML ~~LOC~~ SOLN
60.0000 mg | Freq: Once | SUBCUTANEOUS | Status: AC
  Administered 2017-11-05: 60 mg via SUBCUTANEOUS

## 2017-11-05 NOTE — Progress Notes (Signed)
Pt given Prolia inj Tolerated well Next due in 04/2018

## 2017-11-19 ENCOUNTER — Other Ambulatory Visit: Payer: Self-pay | Admitting: Nurse Practitioner

## 2017-11-24 ENCOUNTER — Other Ambulatory Visit: Payer: Self-pay | Admitting: *Deleted

## 2017-11-24 MED ORDER — ONETOUCH DELICA LANCETS 33G MISC: 1 refills | Status: DC

## 2017-11-24 MED ORDER — GLUCOSE BLOOD VI STRP: ORAL_STRIP | 1 refills | Status: DC

## 2018-01-13 ENCOUNTER — Other Ambulatory Visit: Payer: Self-pay | Admitting: Nurse Practitioner

## 2018-01-13 DIAGNOSIS — E782 Mixed hyperlipidemia: Secondary | ICD-10-CM

## 2018-02-05 ENCOUNTER — Encounter: Payer: Self-pay | Admitting: Nurse Practitioner

## 2018-02-05 ENCOUNTER — Ambulatory Visit (INDEPENDENT_AMBULATORY_CARE_PROVIDER_SITE_OTHER): Payer: Medicare HMO | Admitting: Nurse Practitioner

## 2018-02-05 VITALS — BP 107/64 | HR 73 | Temp 96.9°F | Ht 64.0 in | Wt 147.0 lb

## 2018-02-05 DIAGNOSIS — M810 Age-related osteoporosis without current pathological fracture: Secondary | ICD-10-CM

## 2018-02-05 DIAGNOSIS — I10 Essential (primary) hypertension: Secondary | ICD-10-CM

## 2018-02-05 DIAGNOSIS — E118 Type 2 diabetes mellitus with unspecified complications: Secondary | ICD-10-CM

## 2018-02-05 DIAGNOSIS — E1142 Type 2 diabetes mellitus with diabetic polyneuropathy: Secondary | ICD-10-CM | POA: Diagnosis not present

## 2018-02-05 DIAGNOSIS — K219 Gastro-esophageal reflux disease without esophagitis: Secondary | ICD-10-CM | POA: Diagnosis not present

## 2018-02-05 DIAGNOSIS — D649 Anemia, unspecified: Secondary | ICD-10-CM

## 2018-02-05 DIAGNOSIS — E782 Mixed hyperlipidemia: Secondary | ICD-10-CM | POA: Diagnosis not present

## 2018-02-05 LAB — BAYER DCA HB A1C WAIVED: HB A1C: 6.2 % (ref <7.0)

## 2018-02-05 MED ORDER — SIMVASTATIN 20 MG PO TABS: 20.0000 mg | ORAL_TABLET | Freq: Every day | ORAL | 1 refills | Status: DC

## 2018-02-05 MED ORDER — LISINOPRIL 5 MG PO TABS: 5.0000 mg | ORAL_TABLET | Freq: Every day | ORAL | 1 refills | Status: DC

## 2018-02-05 MED ORDER — METFORMIN HCL 500 MG PO TABS: 500.0000 mg | ORAL_TABLET | Freq: Two times a day (BID) | ORAL | 1 refills | Status: DC

## 2018-02-05 MED ORDER — GABAPENTIN 100 MG PO CAPS: 100.0000 mg | ORAL_CAPSULE | Freq: Three times a day (TID) | ORAL | 1 refills | Status: DC

## 2018-02-05 NOTE — Progress Notes (Signed)
Subjective:    Patient ID: Lindsay Villarreal, female    DOB: 1945/12/06, 72 y.o.   MRN: [id number]  HPI Lindsay Villarreal is here today for follow up of chronic medical problem.  Outpatient Encounter Medications as of 02/05/2018  Medication Sig  . aspirin 81 MG tablet Take 81 mg by mouth daily.  [name] b complex vitamins capsule Take 1 capsule by mouth daily.  . Biotin 10 MG TABS Take 4 tablets by mouth daily.  . calcium carbonate (OS-CAL) 600 MG TABS tablet Take 600 mg by mouth daily with breakfast.  . denosumab (PROLIA) 60 MG/ML SOLN injection Inject 60 mg into the skin every 6 (six) months. Administer in upper arm, thigh, or abdomen  . ferrous sulfate 325 (65 FE) MG EC tablet Take 1 tablet (325 mg total) daily by mouth.  . gabapentin (NEURONTIN) 100 MG capsule Take 1 capsule (100 mg total) 3 (three) times daily by mouth.  [name] glucose blood (ONE TOUCH ULTRA TEST) test strip USE ONE STRIP TO CHECK GLUCOSE ONCE DAILY  . lisinopril (PRINIVIL,ZESTRIL) 5 MG tablet Take 1 tablet (5 mg total) daily by mouth.  . metFORMIN (GLUCOPHAGE) 500 MG tablet Take 1 tablet (500 mg total) 2 (two) times daily by mouth.  [name] LANCETS 80D MISC USE ONE  TO CHECK GLUCOSE ONCE DAILY  . simvastatin (ZOCOR) 20 MG tablet TAKE 1 TABLET BY MOUTH ONCE DAILY     1. Essential hypertension  No c/o chest pain ,sob or headache. Does not check blood pressure at home  2. Gastroesophageal reflux disease without esophagitis  Currently on no medications  3. Type 2 diabetes mellitus with complication, without long-term current use of insulin (HCC) Last hgba1c was  5.8%. Patient does not check blood sugars very often  4. Diabetic autonomic neuropathy associated with type 2 diabetes mellitus (HCC)  Numbness and tingling in toes on both feet daily.  5. Age-related osteoporosis without current pathological fracture  Last dexa scan was 02/24/17 with  t-score was -2.5  6. Anemia, unspecified type  Takes iron supplement daily    7. Mixed hyperlipidemia  Not watching diet at all    New complaints: None today  Social history: Patient lives by herself   Review of Systems  Constitutional: Negative for activity change and appetite change.  HENT: Negative.   Eyes: Negative for pain.  Respiratory: Negative for shortness of breath.   Cardiovascular: Negative for chest pain, palpitations and leg swelling.  Gastrointestinal: Negative for abdominal pain.  Endocrine: Negative for polydipsia.  Genitourinary: Negative.   Skin: Negative for rash.  Neurological: Negative for dizziness, weakness and headaches.  Hematological: Does not bruise/bleed easily.  Psychiatric/Behavioral: Negative.   All other systems reviewed and are negative.      Objective:   Physical Exam  Constitutional: She is oriented to person, place, and time. She appears well-developed and well-nourished.  HENT:  Nose: Nose normal.  Mouth/Throat: Oropharynx is clear and moist.  Eyes: EOM are normal.  Neck: Trachea normal, normal range of motion and full passive range of motion without pain. Neck supple. No JVD present. Carotid bruit is not present. No thyromegaly present.  Cardiovascular: Normal rate, regular rhythm and intact distal pulses. Exam reveals no gallop and no friction rub.  Murmur (3/6 systolic heard throughout) heard. Pulmonary/Chest: Effort normal and breath sounds normal.  Abdominal: Soft. Bowel sounds are normal. She exhibits no distension and no mass. There is no tenderness.  Musculoskeletal: Normal range of motion.  Lymphadenopathy:    She has no cervical adenopathy.  Neurological: She is alert and oriented to person, place, and time. She has normal reflexes.  Skin: Skin is warm and dry.  Psychiatric: She has a normal mood and affect. Her behavior is normal. Judgment and thought content normal.   BP 107/64   Pulse 73   Temp (!) 96.9 F (36.1 C) (Oral)   Ht '5\' 4"'  (1.626 m)   Wt 147 lb (66.7 kg)   BMI 25.23 kg/m          Assessment & Plan:  1. Essential hypertension Low sodium diet - CMP14+EGFR - lisinopril (PRINIVIL,ZESTRIL) 5 MG tablet; Take 1 tablet (5 mg total) by mouth daily.  Dispense: 90 tablet; Refill: 1  2. Gastroesophageal reflux disease without esophagitis Avoid spicy foods Do not eat 2 hours prior to bedtime  3. Type 2 diabetes mellitus with complication, without long-term current use of insulin (HCC) Continue to watch carbs in diet - Microalbumin / creatinine urine ratio - Bayer DCA Hb A1c Waived  4. Type 2 diabetes mellitus with diabetic polyneuropathy, without long-term current use of insulin (HCC) - metFORMIN (GLUCOPHAGE) 500 MG tablet; Take 1 tablet (500 mg total) by mouth 2 (two) times daily.  Dispense: 180 tablet; Refill: 1 Do not go barefooted - gabapentin (NEURONTIN) 100 MG capsule; Take 1 capsule (100 mg total) by mouth 3 (three) times daily.  Dispense: 90 capsule; Refill: 1  5. Age-related osteoporosis without current pathological fracture Weight bearing exercises  6. Anemia, unspecified type - CBC with Differential/Platelet  7. Mixed hyperlipidemia Low fat diet - Lipid panel - simvastatin (ZOCOR) 20 MG tablet; Take 1 tablet (20 mg total) by mouth daily.  Dispense: 90 tablet; Refill: 1     Labs pending Health maintenance reviewed Diet and exercise encouraged Continue all meds Follow up  In 3 months   [place], FNP

## 2018-02-05 NOTE — Patient Instructions (Signed)

## 2018-02-06 LAB — CBC WITH DIFFERENTIAL/PLATELET
BASOS ABS: 0 10*3/uL (ref 0.0–0.2)
Basos: 0 %
EOS (ABSOLUTE): 0.1 10*3/uL (ref 0.0–0.4)
Eos: 1 %
Hematocrit: 38.9 % (ref 34.0–46.6)
Hemoglobin: 12.9 g/dL (ref 11.1–15.9)
Immature Grans (Abs): 0 10*3/uL (ref 0.0–0.1)
Immature Granulocytes: 0 %
LYMPHS ABS: 2.9 10*3/uL (ref 0.7–3.1)
LYMPHS: 33 %
MCH: 29.5 pg (ref 26.6–33.0)
MCHC: 33.2 g/dL (ref 31.5–35.7)
MCV: 89 fL (ref 79–97)
MONOS ABS: 0.5 10*3/uL (ref 0.1–0.9)
Monocytes: 6 %
NEUTROS ABS: 5.1 10*3/uL (ref 1.4–7.0)
Neutrophils: 60 %
PLATELETS: 315 10*3/uL (ref 150–379)
RBC: 4.37 x10E6/uL (ref 3.77–5.28)
RDW: 13.8 % (ref 12.3–15.4)
WBC: 8.6 10*3/uL (ref 3.4–10.8)

## 2018-02-06 LAB — CMP14+EGFR
ALBUMIN: 4.1 g/dL (ref 3.5–4.8)
ALT: 12 IU/L (ref 0–32)
AST: 13 IU/L (ref 0–40)
Albumin/Globulin Ratio: 1.9 (ref 1.2–2.2)
Alkaline Phosphatase: 50 IU/L (ref 39–117)
BUN / CREAT RATIO: 25 (ref 12–28)
BUN: 15 mg/dL (ref 8–27)
Bilirubin Total: 0.2 mg/dL (ref 0.0–1.2)
CALCIUM: 10.1 mg/dL (ref 8.7–10.3)
CO2: 25 mmol/L (ref 20–29)
CREATININE: 0.59 mg/dL (ref 0.57–1.00)
Chloride: 101 mmol/L (ref 96–106)
GFR, EST AFRICAN AMERICAN: 107 mL/min/{1.73_m2} (ref >59)
GFR, EST NON AFRICAN AMERICAN: 93 mL/min/{1.73_m2} (ref >59)
GLOBULIN, TOTAL: 2.2 g/dL (ref 1.5–4.5)
Glucose: 102 mg/dL — ABNORMAL HIGH (ref 65–99)
Potassium: 3.9 mmol/L (ref 3.5–5.2)
SODIUM: 141 mmol/L (ref 134–144)
TOTAL PROTEIN: 6.3 g/dL (ref 6.0–8.5)

## 2018-02-06 LAB — LIPID PANEL
CHOL/HDL RATIO: 3 ratio (ref 0.0–4.4)
Cholesterol, Total: 117 mg/dL (ref 100–199)
HDL: 39 mg/dL — ABNORMAL LOW (ref >39)
LDL CALC: 43 mg/dL (ref 0–99)
Triglycerides: 177 mg/dL — ABNORMAL HIGH (ref 0–149)
VLDL Cholesterol Cal: 35 mg/dL (ref 5–40)

## 2018-04-22 ENCOUNTER — Telehealth: Payer: Self-pay | Admitting: Nurse Practitioner

## 2018-04-22 NOTE — Telephone Encounter (Signed)
appt scheduled Pt notified 

## 2018-05-10 ENCOUNTER — Ambulatory Visit (INDEPENDENT_AMBULATORY_CARE_PROVIDER_SITE_OTHER): Payer: Medicare HMO | Admitting: *Deleted

## 2018-05-10 ENCOUNTER — Ambulatory Visit: Payer: Medicare HMO

## 2018-05-10 DIAGNOSIS — M810 Age-related osteoporosis without current pathological fracture: Secondary | ICD-10-CM | POA: Diagnosis not present

## 2018-05-10 MED ORDER — DENOSUMAB 60 MG/ML ~~LOC~~ SOSY
60.0000 mg | PREFILLED_SYRINGE | Freq: Once | SUBCUTANEOUS | Status: AC
  Administered 2018-05-10: 60 mg via SUBCUTANEOUS

## 2018-05-10 NOTE — Progress Notes (Signed)
Pt tolerated well

## 2018-07-29 ENCOUNTER — Ambulatory Visit (INDEPENDENT_AMBULATORY_CARE_PROVIDER_SITE_OTHER): Payer: Medicare HMO | Admitting: Nurse Practitioner

## 2018-07-29 ENCOUNTER — Encounter: Payer: Self-pay | Admitting: Nurse Practitioner

## 2018-07-29 VITALS — BP 117/66 | HR 69 | Temp 97.2°F | Ht 64.0 in | Wt 140.0 lb

## 2018-07-29 DIAGNOSIS — E782 Mixed hyperlipidemia: Secondary | ICD-10-CM | POA: Diagnosis not present

## 2018-07-29 DIAGNOSIS — I10 Essential (primary) hypertension: Secondary | ICD-10-CM

## 2018-07-29 DIAGNOSIS — D649 Anemia, unspecified: Secondary | ICD-10-CM

## 2018-07-29 DIAGNOSIS — E118 Type 2 diabetes mellitus with unspecified complications: Secondary | ICD-10-CM | POA: Diagnosis not present

## 2018-07-29 DIAGNOSIS — K219 Gastro-esophageal reflux disease without esophagitis: Secondary | ICD-10-CM

## 2018-07-29 DIAGNOSIS — D509 Iron deficiency anemia, unspecified: Secondary | ICD-10-CM

## 2018-07-29 DIAGNOSIS — E1143 Type 2 diabetes mellitus with diabetic autonomic (poly)neuropathy: Secondary | ICD-10-CM

## 2018-07-29 DIAGNOSIS — E1142 Type 2 diabetes mellitus with diabetic polyneuropathy: Secondary | ICD-10-CM

## 2018-07-29 DIAGNOSIS — M810 Age-related osteoporosis without current pathological fracture: Secondary | ICD-10-CM

## 2018-07-29 LAB — BAYER DCA HB A1C WAIVED: HB A1C (BAYER DCA - WAIVED): 6.2 % (ref <7.0)

## 2018-07-29 MED ORDER — METFORMIN HCL 500 MG PO TABS: 500.0000 mg | ORAL_TABLET | Freq: Two times a day (BID) | ORAL | 1 refills | Status: DC

## 2018-07-29 MED ORDER — SIMVASTATIN 20 MG PO TABS: 20.0000 mg | ORAL_TABLET | Freq: Every day | ORAL | 1 refills | Status: DC

## 2018-07-29 MED ORDER — LISINOPRIL 5 MG PO TABS: 5.0000 mg | ORAL_TABLET | Freq: Every day | ORAL | 1 refills | Status: DC

## 2018-07-29 MED ORDER — FERROUS SULFATE 325 (65 FE) MG PO TBEC: 325.0000 mg | DELAYED_RELEASE_TABLET | Freq: Every day | ORAL | 5 refills | Status: DC

## 2018-07-29 MED ORDER — GABAPENTIN 100 MG PO CAPS: 100.0000 mg | ORAL_CAPSULE | Freq: Three times a day (TID) | ORAL | 1 refills | Status: DC

## 2018-07-29 NOTE — Progress Notes (Signed)
 Subjective:    Patient ID: Lindsay Villarreal, female    DOB: 08/22/1946, 72 y.o.   MRN: [id number]   Chief Complaint: Medical Management of Chronic issues  HPI:  1. Type 2 diabetes mellitus with complication, without long-term current use of insulin (HCC) Last hgab1c was  6.2. Blood sugars at home have been below 140. Does not check everyday. No hypoglycemia that she is aware of.  2. Osteoporosis, post-menopausal  Last dexascan was done on 02/24/18. t score of -2.5. She is currently on prolia injections  3. Essential hypertension  No c/o chest pain, sob or headache. Does not check blood pressure at home. BP Readings from Last 3 Encounters:  02/05/18 107/64  10/05/17 119/70  07/03/17 120/70     4. Mixed hyperlipidemia  Doe sent really watch diet but says she does not eat a lot if fried foods.  5. Gastroesophageal reflux disease without esophagitis  Currently not on any rx meds. Does not have symptoms daily  6. Diabetic autonomic neuropathy associated with type 2 diabetes mellitus (HCC) Has burning in bilateral feet. Takes neurontin daily which really helps   7. Iron deficiency anemia, unspecified iron deficiency anemia type  Last hgb was 12.9. Will recheck today. Denies fatigue.    Outpatient Encounter Medications as of 07/29/2018  Medication Sig  . aspirin 81 MG tablet Take 81 mg by mouth daily.  . b complex vitamins capsule Take 1 capsule by mouth daily.  . Biotin 10 MG TABS Take 4 tablets by mouth daily.  . calcium carbonate (OS-CAL) 600 MG TABS tablet Take 600 mg by mouth daily with breakfast.  . denosumab (PROLIA) 60 MG/ML SOLN injection Inject 60 mg into the skin every 6 (six) months. Administer in upper arm, thigh, or abdomen  . ferrous sulfate 325 (65 FE) MG EC tablet Take 1 tablet (325 mg total) daily by mouth.  . gabapentin (NEURONTIN) 100 MG capsule Take 1 capsule (100 mg total) by mouth 3 (three) times daily.  . glucose blood (ONE TOUCH ULTRA TEST) test strip USE ONE  STRIP TO CHECK GLUCOSE ONCE DAILY  . lisinopril (PRINIVIL,ZESTRIL) 5 MG tablet Take 1 tablet (5 mg total) by mouth daily.  . metFORMIN (GLUCOPHAGE) 500 MG tablet Take 1 tablet (500 mg total) by mouth 2 (two) times daily.  . ONETOUCH DELICA LANCETS 33G MISC USE ONE  TO CHECK GLUCOSE ONCE DAILY  . simvastatin (ZOCOR) 20 MG tablet Take 1 tablet (20 mg total) by mouth daily.       New complaints: None today  Social history: Lives by herself. Her sister died a few months ago and she is trying to look after her brother in law who is not doing well.   Review of Systems  Constitutional: Negative for activity change and appetite change.  HENT: Negative.   Eyes: Negative for pain.  Respiratory: Negative for shortness of breath.   Cardiovascular: Negative for chest pain, palpitations and leg swelling.  Gastrointestinal: Negative for abdominal pain.  Endocrine: Negative for polydipsia.  Genitourinary: Negative.   Skin: Negative for rash.  Neurological: Negative for dizziness, weakness and headaches.  Hematological: Does not bruise/bleed easily.  Psychiatric/Behavioral: Negative.   All other systems reviewed and are negative.      Objective:   Physical Exam  Constitutional: She is oriented to person, place, and time. She appears well-developed and well-nourished. No distress.  HENT:  Head: Normocephalic.  Nose: Nose normal.  Mouth/Throat: Oropharynx is clear and moist.  Eyes: Pupils are   equal, round, and reactive to light. EOM are normal.  Neck: Normal range of motion. Neck supple. No JVD present. Carotid bruit is not present.  Cardiovascular: Normal rate, regular rhythm, normal heart sounds and intact distal pulses.  Pulmonary/Chest: Effort normal and breath sounds normal. No respiratory distress. She has no wheezes. She has no rales. She exhibits no tenderness.  Abdominal: Soft. Normal appearance, normal aorta and bowel sounds are normal. She exhibits no distension, no abdominal  bruit, no pulsatile midline mass and no mass. There is no splenomegaly or hepatomegaly. There is no tenderness.  Musculoskeletal: Normal range of motion. She exhibits no edema.  Lymphadenopathy:    She has no cervical adenopathy.  Neurological: She is alert and oriented to person, place, and time. She has normal reflexes.  Skin: Skin is warm and dry.  Psychiatric: She has a normal mood and affect. Her behavior is normal. Judgment and thought content normal.  Nursing note and vitals reviewed.  BP 117/66   Pulse 69   Temp (!) 97.2 F (36.2 C) (Oral)   Ht 5' 4" (1.626 m)   Wt 140 lb (63.5 kg)   BMI 24.03 kg/m   hgba1c 6.2%      Assessment & Plan:  Adryan C Lynne comes in today with chief complaint of Medical Management of Chronic Issues   Diagnosis and orders addressed:  1. Type 2 diabetes mellitus with diabetic polyneuropathy, without long-term current use of insulin (HCC) - metFORMIN (GLUCOPHAGE) 500 MG tablet; Take 1 tablet (500 mg total) by mouth 2 (two) times daily.  Dispense: 180 tablet; Refill: 1 - gabapentin (NEURONTIN) 100 MG capsule; Take 1 capsule (100 mg total) by mouth 3 (three) times daily.  Dispense: 90 capsule; Refill: 1 Continue to watch carbs in diet - Bayer DCA Hb A1c Waived  2. Osteoporosis, post-menopausal Weight bearing exercise  3. Essential hypertension Low sodium diet - CMP14+EGFR - lisinopril (PRINIVIL,ZESTRIL) 5 MG tablet; Take 1 tablet (5 mg total) by mouth daily.  Dispense: 90 tablet; Refill: 1  4. Mixed hyperlipidemia Low fat diet - Lipid panel - simvastatin (ZOCOR) 20 MG tablet; Take 1 tablet (20 mg total) by mouth daily.  Dispense: 90 tablet; Refill: 1  5. Gastroesophageal reflux disease without esophagitis Avoid spicy foods Do not eat 2 hours prior to bedtime  6. Diabetic autonomic neuropathy associated with type 2 diabetes mellitus (HCC) Do not go barefooted  7. Iron deficiency anemia, unspecified iron deficiency anemia  type Continue iron supplement - ferrous sulfate 325 (65 FE) MG EC tablet; Take 1 tablet (325 mg total) by mouth daily.  Dispense: 30 tablet; Refill: 5     Labs pending Health Maintenance reviewed Diet and exercise encouraged  Follow up plan: 6 months   Mary-Margaret Martin, FNP  

## 2018-07-29 NOTE — Patient Instructions (Signed)

## 2018-07-30 LAB — LIPID PANEL
CHOL/HDL RATIO: 3.5 ratio (ref 0.0–4.4)
CHOLESTEROL TOTAL: 128 mg/dL (ref 100–199)
HDL: 37 mg/dL — ABNORMAL LOW (ref >39)
LDL CALC: 48 mg/dL (ref 0–99)
TRIGLYCERIDES: 214 mg/dL — AB (ref 0–149)
VLDL Cholesterol Cal: 43 mg/dL — ABNORMAL HIGH (ref 5–40)

## 2018-07-30 LAB — CMP14+EGFR
A/G RATIO: 1.9 (ref 1.2–2.2)
ALK PHOS: 46 IU/L (ref 39–117)
ALT: 9 IU/L (ref 0–32)
AST: 10 IU/L (ref 0–40)
Albumin: 4.2 g/dL (ref 3.5–4.8)
BILIRUBIN TOTAL: 0.3 mg/dL (ref 0.0–1.2)
BUN/Creatinine Ratio: 26 (ref 12–28)
BUN: 16 mg/dL (ref 8–27)
CALCIUM: 9.6 mg/dL (ref 8.7–10.3)
CHLORIDE: 103 mmol/L (ref 96–106)
CO2: 25 mmol/L (ref 20–29)
Creatinine, Ser: 0.61 mg/dL (ref 0.57–1.00)
GFR calc Af Amer: 105 mL/min/{1.73_m2} (ref >59)
GFR, EST NON AFRICAN AMERICAN: 91 mL/min/{1.73_m2} (ref >59)
Globulin, Total: 2.2 g/dL (ref 1.5–4.5)
Glucose: 90 mg/dL (ref 65–99)
POTASSIUM: 4.1 mmol/L (ref 3.5–5.2)
Sodium: 144 mmol/L (ref 134–144)
Total Protein: 6.4 g/dL (ref 6.0–8.5)

## 2018-09-14 LAB — HM MAMMOGRAPHY

## 2018-10-29 ENCOUNTER — Ambulatory Visit (INDEPENDENT_AMBULATORY_CARE_PROVIDER_SITE_OTHER): Payer: Medicare HMO | Admitting: Nurse Practitioner

## 2018-10-29 ENCOUNTER — Encounter: Payer: Self-pay | Admitting: Nurse Practitioner

## 2018-10-29 VITALS — BP 135/65 | HR 65 | Temp 96.8°F | Ht 64.0 in | Wt 142.0 lb

## 2018-10-29 DIAGNOSIS — E1143 Type 2 diabetes mellitus with diabetic autonomic (poly)neuropathy: Secondary | ICD-10-CM

## 2018-10-29 DIAGNOSIS — E782 Mixed hyperlipidemia: Secondary | ICD-10-CM | POA: Diagnosis not present

## 2018-10-29 DIAGNOSIS — M810 Age-related osteoporosis without current pathological fracture: Secondary | ICD-10-CM

## 2018-10-29 DIAGNOSIS — E1142 Type 2 diabetes mellitus with diabetic polyneuropathy: Secondary | ICD-10-CM

## 2018-10-29 DIAGNOSIS — E118 Type 2 diabetes mellitus with unspecified complications: Secondary | ICD-10-CM | POA: Diagnosis not present

## 2018-10-29 DIAGNOSIS — I10 Essential (primary) hypertension: Secondary | ICD-10-CM | POA: Diagnosis not present

## 2018-10-29 DIAGNOSIS — D509 Iron deficiency anemia, unspecified: Secondary | ICD-10-CM

## 2018-10-29 DIAGNOSIS — K219 Gastro-esophageal reflux disease without esophagitis: Secondary | ICD-10-CM | POA: Diagnosis not present

## 2018-10-29 DIAGNOSIS — D649 Anemia, unspecified: Secondary | ICD-10-CM

## 2018-10-29 LAB — CBC WITH DIFFERENTIAL/PLATELET
BASOS: 1 %
Basophils Absolute: 0.1 10*3/uL (ref 0.0–0.2)
EOS (ABSOLUTE): 0.1 10*3/uL (ref 0.0–0.4)
Eos: 1 %
Hematocrit: 38.5 % (ref 34.0–46.6)
Hemoglobin: 12.8 g/dL (ref 11.1–15.9)
IMMATURE GRANS (ABS): 0 10*3/uL (ref 0.0–0.1)
Immature Granulocytes: 0 %
Lymphocytes Absolute: 3 10*3/uL (ref 0.7–3.1)
Lymphs: 38 %
MCH: 29.5 pg (ref 26.6–33.0)
MCHC: 33.2 g/dL (ref 31.5–35.7)
MCV: 89 fL (ref 79–97)
Monocytes Absolute: 0.5 10*3/uL (ref 0.1–0.9)
Monocytes: 7 %
Neutrophils Absolute: 4.2 10*3/uL (ref 1.4–7.0)
Neutrophils: 53 %
Platelets: 301 10*3/uL (ref 150–450)
RBC: 4.34 x10E6/uL (ref 3.77–5.28)
RDW: 12.5 % (ref 12.3–15.4)
WBC: 7.9 10*3/uL (ref 3.4–10.8)

## 2018-10-29 LAB — BAYER DCA HB A1C WAIVED: HB A1C (BAYER DCA - WAIVED): 6.3 % (ref <7.0)

## 2018-10-29 MED ORDER — SIMVASTATIN 20 MG PO TABS: 20.0000 mg | ORAL_TABLET | Freq: Every day | ORAL | 1 refills | Status: DC

## 2018-10-29 MED ORDER — METFORMIN HCL 500 MG PO TABS: 500.0000 mg | ORAL_TABLET | Freq: Two times a day (BID) | ORAL | 1 refills | Status: DC

## 2018-10-29 MED ORDER — FERROUS SULFATE 325 (65 FE) MG PO TBEC: 325.0000 mg | DELAYED_RELEASE_TABLET | Freq: Every day | ORAL | 1 refills | Status: DC

## 2018-10-29 MED ORDER — GABAPENTIN 100 MG PO CAPS: 100.0000 mg | ORAL_CAPSULE | Freq: Three times a day (TID) | ORAL | 1 refills | Status: DC

## 2018-10-29 MED ORDER — LISINOPRIL 5 MG PO TABS: 5.0000 mg | ORAL_TABLET | Freq: Every day | ORAL | 1 refills | Status: DC

## 2018-10-29 NOTE — Progress Notes (Signed)
Subjective:    Patient ID: Lindsay Villarreal, female    DOB: 1946-08-08, 72 y.o.   MRN: [id number]  .  Chief Complaint: medical management of chronic issues  HPI:  1. Type 2 diabetes mellitus with complication, without long-term current use of insulin (HCC) Last hgba1c was  6.2%. But she does not check blood sugars at home every day. Sh edenies any symptoms of low blood sugar.  2. Essential hypertension  No c/o chaet pain, sob or headache. She does not check blood pressure at home. BP Readings from Last 3 Encounters:  07/29/18 117/66  02/05/18 107/64  10/05/17 119/70     3. Mixed hyperlipidemia  Does not really watch diet at all.  4. Gastroesophageal reflux disease without esophagitis  She is currently not taking any rx meds. Has symptoms occasionally.  5. Diabetic autonomic neuropathy associated with type 2 diabetes mellitus ([place]) Has burning in her feet . Takes neurontin daily, helps with burning sensation in feet  6. Age-related osteoporosis without current pathological fracture  Last dexascan was 02/24/17. Negative t score was -2.5. she is currently on prolia  7. Iron deficiency anemia, unspecified iron deficiency anemia type  Need to repeat labs today- is currently not on a supplement    Outpatient Encounter Medications as of 10/29/2018  Medication Sig  . aspirin 81 MG tablet Take 81 mg by mouth daily.  [name] b complex vitamins capsule Take 1 capsule by mouth daily.  . Biotin 10 MG TABS Take 4 tablets by mouth daily.  . calcium carbonate (OS-CAL) 600 MG TABS tablet Take 600 mg by mouth daily with breakfast.  . denosumab (PROLIA) 60 MG/ML SOLN injection Inject 60 mg into the skin every 6 (six) months. Administer in upper arm, thigh, or abdomen  . ferrous sulfate 325 (65 FE) MG EC tablet Take 1 tablet (325 mg total) by mouth daily.  [name] gabapentin (NEURONTIN) 100 MG capsule Take 1 capsule (100 mg total) by mouth 3 (three) times daily.  [name] glucose blood (ONE TOUCH ULTRA TEST) test  strip USE ONE STRIP TO CHECK GLUCOSE ONCE DAILY  . lisinopril (PRINIVIL,ZESTRIL) 5 MG tablet Take 1 tablet (5 mg total) by mouth daily.  . metFORMIN (GLUCOPHAGE) 500 MG tablet Take 1 tablet (500 mg total) by mouth 2 (two) times daily.  [name] LANCETS 43X MISC USE ONE  TO CHECK GLUCOSE ONCE DAILY  . simvastatin (ZOCOR) 20 MG tablet Take 1 tablet (20 mg total) by mouth daily.      New complaints: none today  Social history: Lives by herself. She has family that checks on her daily   Review of Systems  Constitutional: Negative for activity change and appetite change.  HENT: Negative.   Eyes: Negative for pain.  Respiratory: Negative for shortness of breath.   Cardiovascular: Negative for chest pain, palpitations and leg swelling.  Gastrointestinal: Negative for abdominal pain.  Endocrine: Negative for polydipsia.  Genitourinary: Negative.   Skin: Negative for rash.  Neurological: Negative for dizziness, weakness and headaches.  Hematological: Does not bruise/bleed easily.  Psychiatric/Behavioral: Negative.   All other systems reviewed and are negative.      Objective:   Physical Exam Vitals signs and nursing note reviewed.  Constitutional:      General: She is not in acute distress.    Appearance: Normal appearance. She is well-developed.  HENT:     Head: Normocephalic.     Nose: Nose normal.  Eyes:     Pupils: Pupils are  equal, round, and reactive to light.  Neck:     Musculoskeletal: Normal range of motion and neck supple.     Vascular: No carotid bruit or JVD.  Cardiovascular:     Rate and Rhythm: Normal rate and regular rhythm.     Heart sounds: Normal heart sounds.  Pulmonary:     Effort: Pulmonary effort is normal. No respiratory distress.     Breath sounds: Normal breath sounds. No wheezing or rales.  Chest:     Chest wall: No tenderness.  Abdominal:     General: Bowel sounds are normal. There is no distension or abdominal bruit.     Palpations:  Abdomen is soft. There is no hepatomegaly, splenomegaly, mass or pulsatile mass.     Tenderness: There is no abdominal tenderness.  Musculoskeletal: Normal range of motion.  Lymphadenopathy:     Cervical: No cervical adenopathy.  Skin:    General: Skin is warm and dry.  Neurological:     Mental Status: She is alert and oriented to person, place, and time.     Deep Tendon Reflexes: Reflexes are normal and symmetric.  Psychiatric:        Behavior: Behavior normal.        Thought Content: Thought content normal.        Judgment: Judgment normal.    BP 135/65   Pulse 65   Temp (!) 96.8 F (36 C) (Oral)   Ht '5\' 4"'  (1.626 m)   Wt 142 lb (64.4 kg)   BMI 24.37 kg/m   hgba1c 6.3    Assessment & Plan:  TALER KUSHNER comes in today with chief complaint of Medical Management of Chronic Issues   Diagnosis and orders addressed:  1. Type 2 diabetes mellitus with complication, without long-term current use of insulin (HCC) Continue to watch carbs in diet- metFORMIN (GLUCOPHAGE) 500 MG tablet; Take 1 tablet (500 mg total) by mouth 2 (two) times daily.  Dispense: 180 tablet; Refill: 1 - gabapentin (NEURONTIN) 100 MG capsule; Take 1 capsule (100 mg total) by mouth 3 (three) times daily.  Dispense: 90 capsule; Refill: 1 - Microalbumin / creatinine urine ratio - Bayer DCA Hb A1c Waived  2. Essential hypertension Low sodium diet - CMP14+EGFR - lisinopril (PRINIVIL,ZESTRIL) 5 MG tablet; Take 1 tablet (5 mg total) by mouth daily.  Dispense: 90 tablet; Refill: 1  3. Mixed hyperlipidemia Low fat diet - Lipid panel - simvastatin (ZOCOR) 20 MG tablet; Take 1 tablet (20 mg total) by mouth daily.  Dispense: 90 tablet; Refill: 1  4. Gastroesophageal reflux disease without esophagitis Avoid spicy foods Do not eat 2 hours prior to bedtime  5. Diabetic autonomic neuropathy associated with type 2 diabetes mellitus ([place]) Do not go barefooted  6. Age-related osteoporosis without current  pathological fracture Weight bearing exercises  7. Iron deficiency anemia, unspecified iron deficiency anemia type Labs pending - CBC with Differential/Platelet  8. Anemia, unspecified type - ferrous sulfate 325 (65 FE) MG EC tablet; Take 1 tablet (325 mg total) by mouth daily.  Dispense: 90 tablet; Refill: 1  Schedule eye exam   Labs pending Health Maintenance reviewed Diet and exercise encouraged  Follow up plan: 3 month   [place], FNP

## 2018-10-29 NOTE — Patient Instructions (Signed)

## 2018-10-30 LAB — CMP14+EGFR
ALBUMIN: 4.2 g/dL (ref 3.5–4.8)
ALK PHOS: 48 IU/L (ref 39–117)
ALT: 13 IU/L (ref 0–32)
AST: 12 IU/L (ref 0–40)
Albumin/Globulin Ratio: 2.2 (ref 1.2–2.2)
BUN / CREAT RATIO: 24 (ref 12–28)
BUN: 16 mg/dL (ref 8–27)
Bilirubin Total: 0.3 mg/dL (ref 0.0–1.2)
CO2: 26 mmol/L (ref 20–29)
CREATININE: 0.67 mg/dL (ref 0.57–1.00)
Calcium: 9.7 mg/dL (ref 8.7–10.3)
Chloride: 99 mmol/L (ref 96–106)
GFR calc Af Amer: 102 mL/min/{1.73_m2} (ref >59)
GFR calc non Af Amer: 88 mL/min/{1.73_m2} (ref >59)
GLOBULIN, TOTAL: 1.9 g/dL (ref 1.5–4.5)
Glucose: 131 mg/dL — ABNORMAL HIGH (ref 65–99)
Potassium: 4 mmol/L (ref 3.5–5.2)
SODIUM: 142 mmol/L (ref 134–144)
Total Protein: 6.1 g/dL (ref 6.0–8.5)

## 2018-10-30 LAB — LIPID PANEL
CHOLESTEROL TOTAL: 127 mg/dL (ref 100–199)
Chol/HDL Ratio: 2.6 ratio (ref 0.0–4.4)
HDL: 48 mg/dL (ref >39)
LDL CALC: 51 mg/dL (ref 0–99)
TRIGLYCERIDES: 138 mg/dL (ref 0–149)
VLDL CHOLESTEROL CAL: 28 mg/dL (ref 5–40)

## 2018-11-12 ENCOUNTER — Ambulatory Visit: Payer: Medicare HMO

## 2018-11-12 ENCOUNTER — Other Ambulatory Visit: Payer: Medicare HMO

## 2018-11-13 LAB — MICROALBUMIN / CREATININE URINE RATIO
CREATININE, UR: 63.6 mg/dL
MICROALB/CREAT RATIO: 9.9 mg/g{creat} (ref 0.0–30.0)
MICROALBUM., U, RANDOM: 6.3 ug/mL

## 2018-11-15 ENCOUNTER — Telehealth: Payer: Self-pay | Admitting: Nurse Practitioner

## 2018-11-15 ENCOUNTER — Ambulatory Visit (INDEPENDENT_AMBULATORY_CARE_PROVIDER_SITE_OTHER): Payer: Medicare HMO

## 2018-11-15 DIAGNOSIS — M810 Age-related osteoporosis without current pathological fracture: Secondary | ICD-10-CM | POA: Diagnosis not present

## 2018-11-15 MED ORDER — DENOSUMAB 60 MG/ML ~~LOC~~ SOSY
60.0000 mg | PREFILLED_SYRINGE | Freq: Once | SUBCUTANEOUS | Status: AC
  Administered 2018-11-15: 60 mg via SUBCUTANEOUS

## 2018-11-15 NOTE — Telephone Encounter (Signed)
Prolia did arrive today. Contacted patient and per her everything has been approved and all she needs is the injection. Advised patient to stop by office today and we would give to her.

## 2019-01-16 ENCOUNTER — Other Ambulatory Visit: Payer: Self-pay | Admitting: Nurse Practitioner

## 2019-01-28 ENCOUNTER — Encounter: Payer: Self-pay | Admitting: Nurse Practitioner

## 2019-01-28 ENCOUNTER — Ambulatory Visit (INDEPENDENT_AMBULATORY_CARE_PROVIDER_SITE_OTHER): Payer: Medicare HMO | Admitting: Nurse Practitioner

## 2019-01-28 ENCOUNTER — Other Ambulatory Visit: Payer: Self-pay | Admitting: *Deleted

## 2019-01-28 ENCOUNTER — Other Ambulatory Visit: Payer: Self-pay

## 2019-01-28 VITALS — BP 129/69 | HR 70 | Temp 97.6°F | Ht 64.0 in | Wt 142.0 lb

## 2019-01-28 DIAGNOSIS — E1143 Type 2 diabetes mellitus with diabetic autonomic (poly)neuropathy: Secondary | ICD-10-CM

## 2019-01-28 DIAGNOSIS — I10 Essential (primary) hypertension: Secondary | ICD-10-CM | POA: Diagnosis not present

## 2019-01-28 DIAGNOSIS — E118 Type 2 diabetes mellitus with unspecified complications: Secondary | ICD-10-CM | POA: Diagnosis not present

## 2019-01-28 DIAGNOSIS — M810 Age-related osteoporosis without current pathological fracture: Secondary | ICD-10-CM

## 2019-01-28 DIAGNOSIS — D649 Anemia, unspecified: Secondary | ICD-10-CM

## 2019-01-28 DIAGNOSIS — E782 Mixed hyperlipidemia: Secondary | ICD-10-CM

## 2019-01-28 DIAGNOSIS — K219 Gastro-esophageal reflux disease without esophagitis: Secondary | ICD-10-CM

## 2019-01-28 DIAGNOSIS — D509 Iron deficiency anemia, unspecified: Secondary | ICD-10-CM

## 2019-01-28 LAB — BAYER DCA HB A1C WAIVED: HB A1C (BAYER DCA - WAIVED): 6.4 % (ref <7.0)

## 2019-01-28 MED ORDER — SIMVASTATIN 20 MG PO TABS: 20.0000 mg | ORAL_TABLET | Freq: Every day | ORAL | 1 refills | Status: DC

## 2019-01-28 MED ORDER — LISINOPRIL 5 MG PO TABS: 5.0000 mg | ORAL_TABLET | Freq: Every day | ORAL | 1 refills | Status: DC

## 2019-01-28 MED ORDER — GABAPENTIN 100 MG PO CAPS: 100.0000 mg | ORAL_CAPSULE | Freq: Three times a day (TID) | ORAL | 1 refills | Status: DC

## 2019-01-28 MED ORDER — METFORMIN HCL 500 MG PO TABS: 500.0000 mg | ORAL_TABLET | Freq: Two times a day (BID) | ORAL | 1 refills | Status: DC

## 2019-01-28 MED ORDER — FERROUS SULFATE 325 (65 FE) MG PO TBEC: 325.0000 mg | DELAYED_RELEASE_TABLET | Freq: Every day | ORAL | 1 refills | Status: DC

## 2019-01-28 NOTE — Progress Notes (Signed)
Subjective:    Patient ID: Lindsay Villarreal, female    DOB: 1946/07/31, 73 y.o.   MRN: [id number]   Chief Complaint: Medical Management of Chronic Issues   HPI:  1. Type 2 diabetes mellitus with complication, without long-term current use of insulin (HCC)  Last hgba1c was 6.3%. her fasting blood sugars are consistently below 120. Sh edeneis any hypoglycemic symptoms  2. Essential hypertension  No c/o chest pain, SOB or headache. Does not check blood pressure at home. BP Readings from Last 3 Encounters:  10/29/18 135/65  07/29/18 117/66  02/05/18 107/64     3. Mixed hyperlipidemia  Does not watch diet and does not do any exercise.  4. Diabetic autonomic neuropathy associated with type 2 diabetes mellitus (HCC) Has numbness in bil toes   5. Gastroesophageal reflux disease without esophagitis  Does not take any rx meds- uses OTC meds when needed  6. Age-related osteoporosis without current pathological fracture Last dexascan was done on  02/24/17 with t score of -2.5. she is currently on prolia  7. Iron deficiency anemia, unspecified iron deficiency anemia type  Last hgb was 12.8. denies any fatigue    Outpatient Encounter Medications as of 01/28/2019  Medication Sig  . aspirin 81 MG tablet Take 81 mg by mouth daily.  [name] b complex vitamins capsule Take 1 capsule by mouth daily.  . Biotin 10 MG TABS Take 4 tablets by mouth daily.  . calcium carbonate (OS-CAL) 600 MG TABS tablet Take 600 mg by mouth daily with breakfast.  . denosumab (PROLIA) 60 MG/ML SOLN injection Inject 60 mg into the skin every 6 (six) months. Administer in upper arm, thigh, or abdomen  . ferrous sulfate 325 (65 FE) MG EC tablet Take 1 tablet (325 mg total) by mouth daily.  [name] gabapentin (NEURONTIN) 100 MG capsule Take 1 capsule (100 mg total) by mouth 3 (three) times daily.  [name] glucose blood (ONE TOUCH ULTRA TEST) test strip USE TO CHECK GLUCOSE ONCE DAILY   E11.9  . lisinopril (PRINIVIL,ZESTRIL) 5 MG tablet Take  1 tablet (5 mg total) by mouth daily.  . metFORMIN (GLUCOPHAGE) 500 MG tablet Take 1 tablet (500 mg total) by mouth 2 (two) times daily.  [name] LANCETS 68T MISC USE ONE  TO CHECK GLUCOSE ONCE DAILY  . simvastatin (ZOCOR) 20 MG tablet Take 1 tablet (20 mg total) by mouth daily.       New complaints: None today  Social history: Lives alone- does not have a medical alert system. She has family that checks on her daily.  Review of Systems  Constitutional: Negative for activity change and appetite change.  HENT: Negative.   Eyes: Negative for pain.  Respiratory: Negative for shortness of breath.   Cardiovascular: Negative for chest pain, palpitations and leg swelling.  Gastrointestinal: Negative for abdominal pain.  Endocrine: Negative for polydipsia.  Genitourinary: Negative.   Skin: Negative for rash.  Neurological: Negative for dizziness, weakness and headaches.  Hematological: Does not bruise/bleed easily.  Psychiatric/Behavioral: Negative.   All other systems reviewed and are negative.      Objective:   Physical Exam Vitals signs and nursing note reviewed.  Constitutional:      General: She is not in acute distress.    Appearance: Normal appearance. She is well-developed.  HENT:     Head: Normocephalic.     Nose: Nose normal.  Eyes:     Pupils: Pupils are equal, round, and reactive to light.  Neck:  Musculoskeletal: Normal range of motion and neck supple.     Vascular: No carotid bruit or JVD.  Cardiovascular:     Rate and Rhythm: Normal rate and regular rhythm.     Heart sounds: Normal heart sounds.  Pulmonary:     Effort: Pulmonary effort is normal. No respiratory distress.     Breath sounds: Normal breath sounds. No wheezing or rales.  Chest:     Chest wall: No tenderness.  Abdominal:     General: Bowel sounds are normal. There is no distension or abdominal bruit.     Palpations: Abdomen is soft. There is no hepatomegaly, splenomegaly, mass or  pulsatile mass.     Tenderness: There is no abdominal tenderness.  Musculoskeletal: Normal range of motion.  Lymphadenopathy:     Cervical: No cervical adenopathy.  Skin:    General: Skin is warm and dry.     Comments: Raised flesh colored lesion between eyebrows  Neurological:     Mental Status: She is alert and oriented to person, place, and time.     Deep Tendon Reflexes: Reflexes are normal and symmetric.  Psychiatric:        Behavior: Behavior normal.        Thought Content: Thought content normal.        Judgment: Judgment normal.    BP 129/69   Pulse 70   Temp 97.6 F (36.4 C) (Oral)   Ht _0  (1.626 m)   Wt 142 lb (64.4 kg)   BMI 24.37 kg/m   hgba1c 6.4%     Assessment & Plan:  ESLI CLEMENTS comes in today with chief complaint of Medical Management of Chronic Issues   Diagnosis and orders addressed:  1. Type 2 diabetes mellitus with complication, without long-term current use of insulin (HCC) Continue to watch carbs in diet - Bayer DCA Hb A1c Waived - metFORMIN (GLUCOPHAGE) 500 MG tablet; Take 1 tablet (500 mg total) by mouth 2 (two) times daily.  Dispense: 180 tablet; Refill: 1  2. Essential hypertension Low sodium diet - CMP14+EGFR - lisinopril (PRINIVIL,ZESTRIL) 5 MG tablet; Take 1 tablet (5 mg total) by mouth daily.  Dispense: 90 tablet; Refill: 1  3. Mixed hyperlipidemia Low fat diet - Lipid panel - simvastatin (ZOCOR) 20 MG tablet; Take 1 tablet (20 mg total) by mouth daily.  Dispense: 90 tablet; Refill: 1  4. Diabetic autonomic neuropathy associated with type 2 diabetes mellitus ([place]) Do not go barefooted - gabapentin (NEURONTIN) 100 MG capsule; Take 1 capsule (100 mg total) by mouth 3 (three) times daily.  Dispense: 90 capsule; Refill: 1   5. Gastroesophageal reflux disease without esophagitis Avoid spicy foods Do not eat 2 hours prior to bedtime  6. Age-related osteoporosis without current pathological fracture Weight bearing  exercise Continue prolia injetions  7. Iron deficiency anemia, unspecified iron deficiency anemia type Labs pending - CBC with Differential/Platelet - ferrous sulfate 325 (65 FE) MG EC tablet; Take 1 tablet (325 mg total) by mouth daily.  Dispense: 90 tablet; Refill: 1   Labs pending Health Maintenance reviewed Diet and exercise encouraged  Follow up plan: 3 months   Mary-Margaret Hassell Done, FNP

## 2019-01-29 LAB — LIPID PANEL
Chol/HDL Ratio: 2.6 ratio (ref 0.0–4.4)
Cholesterol, Total: 115 mg/dL (ref 100–199)
HDL: 45 mg/dL (ref >39)
LDL Calculated: 41 mg/dL (ref 0–99)
Triglycerides: 144 mg/dL (ref 0–149)
VLDL CHOLESTEROL CAL: 29 mg/dL (ref 5–40)

## 2019-01-29 LAB — CBC WITH DIFFERENTIAL/PLATELET
Basophils Absolute: 0 10*3/uL (ref 0.0–0.2)
Basos: 1 %
EOS (ABSOLUTE): 0.1 10*3/uL (ref 0.0–0.4)
Eos: 1 %
Hematocrit: 40.1 % (ref 34.0–46.6)
Hemoglobin: 13.2 g/dL (ref 11.1–15.9)
Immature Grans (Abs): 0 10*3/uL (ref 0.0–0.1)
Immature Granulocytes: 0 %
Lymphocytes Absolute: 3 10*3/uL (ref 0.7–3.1)
Lymphs: 37 %
MCH: 28.6 pg (ref 26.6–33.0)
MCHC: 32.9 g/dL (ref 31.5–35.7)
MCV: 87 fL (ref 79–97)
Monocytes Absolute: 0.6 10*3/uL (ref 0.1–0.9)
Monocytes: 7 %
Neutrophils Absolute: 4.4 10*3/uL (ref 1.4–7.0)
Neutrophils: 54 %
Platelets: 309 10*3/uL (ref 150–450)
RBC: 4.61 x10E6/uL (ref 3.77–5.28)
RDW: 12.3 % (ref 11.7–15.4)
WBC: 8.1 10*3/uL (ref 3.4–10.8)

## 2019-01-29 LAB — CMP14+EGFR
ALT: 14 IU/L (ref 0–32)
AST: 14 IU/L (ref 0–40)
Albumin/Globulin Ratio: 2.1 (ref 1.2–2.2)
Albumin: 4.5 g/dL (ref 3.7–4.7)
Alkaline Phosphatase: 48 IU/L (ref 39–117)
BUN / CREAT RATIO: 19 (ref 12–28)
BUN: 13 mg/dL (ref 8–27)
Bilirubin Total: 0.4 mg/dL (ref 0.0–1.2)
CO2: 22 mmol/L (ref 20–29)
Calcium: 9.3 mg/dL (ref 8.7–10.3)
Chloride: 101 mmol/L (ref 96–106)
Creatinine, Ser: 0.7 mg/dL (ref 0.57–1.00)
GFR calc Af Amer: 100 mL/min/{1.73_m2} (ref >59)
GFR calc non Af Amer: 87 mL/min/{1.73_m2} (ref >59)
Globulin, Total: 2.1 g/dL (ref 1.5–4.5)
Glucose: 113 mg/dL — ABNORMAL HIGH (ref 65–99)
POTASSIUM: 4 mmol/L (ref 3.5–5.2)
Sodium: 140 mmol/L (ref 134–144)
Total Protein: 6.6 g/dL (ref 6.0–8.5)

## 2019-05-17 ENCOUNTER — Ambulatory Visit: Payer: Medicare HMO | Admitting: Nurse Practitioner

## 2019-05-18 ENCOUNTER — Other Ambulatory Visit: Payer: Self-pay | Admitting: Nurse Practitioner

## 2019-05-18 DIAGNOSIS — E1143 Type 2 diabetes mellitus with diabetic autonomic (poly)neuropathy: Secondary | ICD-10-CM

## 2019-05-20 ENCOUNTER — Other Ambulatory Visit: Payer: Self-pay

## 2019-05-20 ENCOUNTER — Encounter: Payer: Self-pay | Admitting: Nurse Practitioner

## 2019-05-20 ENCOUNTER — Ambulatory Visit (INDEPENDENT_AMBULATORY_CARE_PROVIDER_SITE_OTHER): Payer: Medicare HMO | Admitting: Nurse Practitioner

## 2019-05-20 VITALS — BP 134/67 | HR 64 | Temp 97.1°F | Ht 64.0 in | Wt 143.0 lb

## 2019-05-20 DIAGNOSIS — I10 Essential (primary) hypertension: Secondary | ICD-10-CM | POA: Diagnosis not present

## 2019-05-20 DIAGNOSIS — M810 Age-related osteoporosis without current pathological fracture: Secondary | ICD-10-CM

## 2019-05-20 DIAGNOSIS — E782 Mixed hyperlipidemia: Secondary | ICD-10-CM | POA: Diagnosis not present

## 2019-05-20 DIAGNOSIS — E1143 Type 2 diabetes mellitus with diabetic autonomic (poly)neuropathy: Secondary | ICD-10-CM

## 2019-05-20 DIAGNOSIS — D649 Anemia, unspecified: Secondary | ICD-10-CM

## 2019-05-20 DIAGNOSIS — K219 Gastro-esophageal reflux disease without esophagitis: Secondary | ICD-10-CM

## 2019-05-20 DIAGNOSIS — D509 Iron deficiency anemia, unspecified: Secondary | ICD-10-CM

## 2019-05-20 DIAGNOSIS — E118 Type 2 diabetes mellitus with unspecified complications: Secondary | ICD-10-CM | POA: Diagnosis not present

## 2019-05-20 LAB — BAYER DCA HB A1C WAIVED: HB A1C (BAYER DCA - WAIVED): 6.4 % (ref <7.0)

## 2019-05-20 MED ORDER — FERROUS SULFATE 325 (65 FE) MG PO TBEC: 325.0000 mg | DELAYED_RELEASE_TABLET | Freq: Every day | ORAL | 1 refills | Status: DC

## 2019-05-20 MED ORDER — LISINOPRIL 5 MG PO TABS: 5.0000 mg | ORAL_TABLET | Freq: Every day | ORAL | 1 refills | Status: DC

## 2019-05-20 MED ORDER — SIMVASTATIN 20 MG PO TABS: 20.0000 mg | ORAL_TABLET | Freq: Every day | ORAL | 1 refills | Status: DC

## 2019-05-20 MED ORDER — GABAPENTIN 100 MG PO CAPS: 100.0000 mg | ORAL_CAPSULE | Freq: Three times a day (TID) | ORAL | 1 refills | Status: DC

## 2019-05-20 MED ORDER — METFORMIN HCL 500 MG PO TABS: 500.0000 mg | ORAL_TABLET | Freq: Two times a day (BID) | ORAL | 1 refills | Status: DC

## 2019-05-20 NOTE — Patient Instructions (Signed)
Diabetes Mellitus and Foot Care Foot care is an important part of your health, especially when you have diabetes. Diabetes may cause you to have problems because of poor blood flow (circulation) to your feet and legs, which can cause your skin to:  Become thinner and drier.  Break more easily.  Heal more slowly.  Peel and crack. You may also have nerve damage (neuropathy) in your legs and feet, causing decreased feeling in them. This means that you may not notice minor injuries to your feet that could lead to more serious problems. Noticing and addressing any potential problems early is the best way to prevent future foot problems. How to care for your feet Foot hygiene  Wash your feet daily with warm water and mild soap. Do not use hot water. Then, pat your feet and the areas between your toes until they are completely dry. Do not soak your feet as this can dry your skin.  Trim your toenails straight across. Do not dig under them or around the cuticle. File the edges of your nails with an emery board or nail file.  Apply a moisturizing lotion or petroleum jelly to the skin on your feet and to dry, brittle toenails. Use lotion that does not contain alcohol and is unscented. Do not apply lotion between your toes. Shoes and socks  Wear clean socks or stockings every day. Make sure they are not too tight. Do not wear knee-high stockings since they may decrease blood flow to your legs.  Wear shoes that fit properly and have enough cushioning. Always look in your shoes before you put them on to be sure there are no objects inside.  To break in new shoes, wear them for just a few hours a day. This prevents injuries on your feet. Wounds, scrapes, corns, and calluses  Check your feet daily for blisters, cuts, bruises, sores, and redness. If you cannot see the bottom of your feet, use a mirror or ask someone for help.  Do not cut corns or calluses or try to remove them with medicine.  If you  find a minor scrape, cut, or break in the skin on your feet, keep it and the skin around it clean and dry. You may clean these areas with mild soap and water. Do not clean the area with peroxide, alcohol, or iodine.  If you have a wound, scrape, corn, or callus on your foot, look at it several times a day to make sure it is healing and not infected. Check for: ? Redness, swelling, or pain. ? Fluid or blood. ? Warmth. ? Pus or a bad smell. General instructions  Do not cross your legs. This may decrease blood flow to your feet.  Do not use heating pads or hot water bottles on your feet. They may burn your skin. If you have lost feeling in your feet or legs, you may not know this is happening until it is too late.  Protect your feet from hot and cold by wearing shoes, such as at the beach or on hot pavement.  Schedule a complete foot exam at least once a year (annually) or more often if you have foot problems. If you have foot problems, report any cuts, sores, or bruises to your health care provider immediately. Contact a health care provider if:  You have a medical condition that increases your risk of infection and you have any cuts, sores, or bruises on your feet.  You have an injury that is not   healing.  You have redness on your legs or feet.  You feel burning or tingling in your legs or feet.  You have pain or cramps in your legs and feet.  Your legs or feet are numb.  Your feet always feel cold.  You have pain around a toenail. Get help right away if:  You have a wound, scrape, corn, or callus on your foot and: ? You have pain, swelling, or redness that gets worse. ? You have fluid or blood coming from the wound, scrape, corn, or callus. ? Your wound, scrape, corn, or callus feels warm to the touch. ? You have pus or a bad smell coming from the wound, scrape, corn, or callus. ? You have a fever. ? You have a red line going up your leg. Summary  Check your feet every day  for cuts, sores, red spots, swelling, and blisters.  Moisturize feet and legs daily.  Wear shoes that fit properly and have enough cushioning.  If you have foot problems, report any cuts, sores, or bruises to your health care provider immediately.  Schedule a complete foot exam at least once a year (annually) or more often if you have foot problems. This information is not intended to replace advice given to you by your health care provider. Make sure you discuss any questions you have with your health care provider. Document Released: 10/31/2000 Document Revised: 12/16/2017 Document Reviewed: 12/05/2016 Elsevier Patient Education  2020 [employer]  

## 2019-05-20 NOTE — Progress Notes (Signed)
Subjective:    Patient ID: Lindsay Villarreal, female    DOB: [birth year], 73 y.o.   MRN: [id number]   Chief Complaint: medical management of chronic issues  HPI:  1. Essential hypertension No c/o chest pain, sob or headache. Does not check blood pressure at home. BP Readings from Last 3 Encounters:  01/28/19 129/69  10/29/18 135/65  07/29/18 117/66     2. Mixed hyperlipidemia Tries to avoid eating a lot of fried foods  3. Type 2 diabetes mellitus with complication, without long-term current use of insulin (HCC) Last hgba1c was 6.4%. her fasting blood sugars at home are running around 110-130 consistently.  4. Diabetic autonomic neuropathy associated with type 2 diabetes mellitus (HCC) Has constant burning and tingling in bil feet. Take neurontin daily which helps  5. Gastroesophageal reflux disease without esophagitis Only has occasional symptoms.  6. Age-related osteoporosis without current pathological fracture Is on prolia, and is doing well. Her last dexascan was done 02/24/17 with tscore of -2.5. we will repeat dexascan when we have someone available to do it in office.  7. Iron deficiency anemia, unspecified iron deficiency anemia type Takes daily iorn supplement. Last hgb was 13.2.    Outpatient Encounter Medications as of 05/20/2019  Medication Sig  . aspirin 81 MG tablet Take 81 mg by mouth daily.  [name] b complex vitamins capsule Take 1 capsule by mouth daily.  . Biotin 10 MG TABS Take 4 tablets by mouth daily.  . calcium carbonate (OS-CAL) 600 MG TABS tablet Take 600 mg by mouth daily with breakfast.  . denosumab (PROLIA) 60 MG/ML SOLN injection Inject 60 mg into the skin every 6 (six) months. Administer in upper arm, thigh, or abdomen  . ferrous sulfate 325 (65 FE) MG EC tablet Take 1 tablet (325 mg total) by mouth daily.  [name] gabapentin (NEURONTIN) 100 MG capsule TAKE 1 CAPSULE BY MOUTH THREE TIMES DAILY  . glucose blood (ONE TOUCH ULTRA TEST) test strip USE TO CHECK  GLUCOSE ONCE DAILY   E11.9  . lisinopril (PRINIVIL,ZESTRIL) 5 MG tablet Take 1 tablet (5 mg total) by mouth daily.  . metFORMIN (GLUCOPHAGE) 500 MG tablet Take 1 tablet (500 mg total) by mouth 2 (two) times daily.  [name] LANCETS 35T MISC USE ONE  TO CHECK GLUCOSE ONCE DAILY  . simvastatin (ZOCOR) 20 MG tablet Take 1 tablet (20 mg total) by mouth daily.     Past Surgical History:  Procedure Laterality Date  . APPENDECTOMY  1978  . BLADDER REPAIR    . CATARACT EXTRACTION W/ INTRAOCULAR LENS IMPLANT  2010 (left), 2013 (right)  . COLONOSCOPY    . EYE SURGERY    . POLYPECTOMY    . TUBAL LIGATION    . VAGINAL HYSTERECTOMY  1978    Family History  Problem Relation Age of Onset  . Stomach cancer Father   . Pancreatic cancer Father   . Liver cancer Father   . Other Sister 58       Cancer of Bile Duct  . Breast cancer Sister   . Diabetes Sister   . Brain cancer Sister 76  . Heart attack Mother   . Heart failure Mother   . Sarcoidosis Sister   . Colon cancer Neg Hx   . Esophageal cancer Neg Hx   . Rectal cancer Neg Hx     New complaints: Having trouble sleeping. She has tried melatonin OTC which does not help  Social history: Lies by herself.  Her family checks on her daily  Controlled substance contract: N/A     Review of Systems  Constitutional: Negative for activity change and appetite change.  HENT: Negative.   Eyes: Negative for pain.  Respiratory: Negative for shortness of breath.   Cardiovascular: Negative for chest pain, palpitations and leg swelling.  Gastrointestinal: Negative for abdominal pain.  Endocrine: Negative for polydipsia.  Genitourinary: Negative.   Skin: Negative for rash.  Neurological: Negative for dizziness, weakness and headaches.  Hematological: Does not bruise/bleed easily.  Psychiatric/Behavioral: Negative.   All other systems reviewed and are negative.      Objective:   Physical Exam Vitals signs and nursing note  reviewed.  Constitutional:      General: She is not in acute distress.    Appearance: Normal appearance. She is well-developed.  HENT:     Head: Normocephalic.     Nose: Nose normal.  Eyes:     Pupils: Pupils are equal, round, and reactive to light.  Neck:     Musculoskeletal: Normal range of motion and neck supple.     Vascular: No carotid bruit or JVD.  Cardiovascular:     Rate and Rhythm: Normal rate and regular rhythm.     Heart sounds: Murmur (2/6 systolic heard loudestat aortic valve) present.  Pulmonary:     Effort: Pulmonary effort is normal. No respiratory distress.     Breath sounds: Normal breath sounds. No wheezing or rales.  Chest:     Chest wall: No tenderness.  Abdominal:     General: Bowel sounds are normal. There is no distension or abdominal bruit.     Palpations: Abdomen is soft. There is no hepatomegaly, splenomegaly, mass or pulsatile mass.     Tenderness: There is no abdominal tenderness.  Musculoskeletal: Normal range of motion.  Lymphadenopathy:     Cervical: No cervical adenopathy.  Skin:    General: Skin is warm and dry.  Neurological:     Mental Status: She is alert and oriented to person, place, and time.     Deep Tendon Reflexes: Reflexes are normal and symmetric.  Psychiatric:        Behavior: Behavior normal.        Thought Content: Thought content normal.        Judgment: Judgment normal.    BP 134/67   Pulse 64   Temp (!) 97.1 F (36.2 C) (Oral)   Ht '5\' 4"'  (1.626 m)   Wt 143 lb (64.9 kg)   BMI 24.55 kg/m   HGBA1c 6.4%      Assessment & Plan:  Lindsay Villarreal comes in today with chief complaint of Medical Management of Chronic Issues   Diagnosis and orders addressed:  1. Essential hypertension Low sodium diet - CMP14+EGFR - lisinopril (ZESTRIL) 5 MG tablet; Take 1 tablet (5 mg total) by mouth daily.  Dispense: 90 tablet; Refill: 1  2. Mixed hyperlipidemia Low fat diet - Lipid panel - simvastatin (ZOCOR) 20 MG tablet; Take  1 tablet (20 mg total) by mouth daily.  Dispense: 90 tablet; Refill: 1  3. Type 2 diabetes mellitus with complication, without long-term current use of insulin (HCC) continu eto count carbs - Bayer DCA Hb A1c Waived - metFORMIN (GLUCOPHAGE) 500 MG tablet; Take 1 tablet (500 mg total) by mouth 2 (two) times daily.  Dispense: 180 tablet; Refill: 1  4. Diabetic autonomic neuropathy associated with type 2 diabetes mellitus ([place]) Do nt go barefooted - gabapentin (NEURONTIN) 100 MG capsule; Take  1 capsule (100 mg total) by mouth 3 (three) times daily.  Dispense: 90 capsule; Refill: 1  5. Gastroesophageal reflux disease without esophagitis Avoid spicy foods Do not eat 2 hours prior to bedtime  6. Age-related osteoporosis without current pathological fracture Weight bearing exercises encouraged  7. Iron deficiency anemia, unspecified iron deficiency anemia type Continue daily iron supplement - ferrous sulfate 325 (65 FE) MG EC tablet; Take 1 tablet (325 mg total) by mouth daily.  Dispense: 90 tablet; Refill: 1   Labs pending Health Maintenance reviewed Diet and exercise encouraged  Follow up plan: 3 months   Mary-Margaret Hassell Done, FNP

## 2019-08-16 ENCOUNTER — Other Ambulatory Visit: Payer: Self-pay

## 2019-08-16 ENCOUNTER — Other Ambulatory Visit: Payer: Self-pay | Admitting: Nurse Practitioner

## 2019-08-16 DIAGNOSIS — E1143 Type 2 diabetes mellitus with diabetic autonomic (poly)neuropathy: Secondary | ICD-10-CM

## 2019-08-16 DIAGNOSIS — D649 Anemia, unspecified: Secondary | ICD-10-CM

## 2019-08-17 ENCOUNTER — Encounter: Payer: Self-pay | Admitting: Family Medicine

## 2019-08-17 ENCOUNTER — Ambulatory Visit (INDEPENDENT_AMBULATORY_CARE_PROVIDER_SITE_OTHER): Payer: Medicare HMO | Admitting: Family Medicine

## 2019-08-17 ENCOUNTER — Other Ambulatory Visit: Payer: Self-pay

## 2019-08-17 ENCOUNTER — Telehealth: Payer: Self-pay | Admitting: Nurse Practitioner

## 2019-08-17 VITALS — BP 128/71 | HR 65 | Temp 97.5°F | Ht 64.0 in | Wt 141.4 lb

## 2019-08-17 DIAGNOSIS — M25562 Pain in left knee: Secondary | ICD-10-CM | POA: Diagnosis not present

## 2019-08-17 MED ORDER — PREDNISONE 10 MG (21) PO TBPK: ORAL_TABLET | ORAL | 0 refills | Status: DC

## 2019-08-17 NOTE — Progress Notes (Signed)
Assessment & Plan:  1. Acute pain of left knee - Acute knee pain exacerbated going up and down stairs this past week at the beach. Previous knee x-ray showed soft tissue swelling but no acute bony or joint abnormality. Patient is going to continue use of Voltaren gel since she has only used it once and it was effective. Prednisone taper sent to pharmacy for her if needed. Education provided on acute knee pain. Encouraged use of ice.  - predniSONE (STERAPRED UNI-PAK 21 TAB) 10 MG (21) TBPK tablet; Use as directed on back of pill pack  Dispense: 21 tablet; Refill: 0   Follow up plan: Return if symptoms worsen or fail to improve.  Hendricks Limes, MSN, APRN, FNP-C [facility]  Subjective:   Patient ID: Lindsay Villarreal, female    DOB: [birth year], 73 y.o.   MRN: [id number]  HPI: [name] is a 73 y.o. female presenting on 08/17/2019 for left knee swollen with pain  Knee Pain: Patient presents with knee swelling and pain involving the left knee. Onset of the symptoms was several months ago. Patient states it has really been going on for five years. Inciting event: none known. She does report she was at the beach recently and had to go up and down steps to get to the room which she feels exacerbated the pain and swelling. Pain is aggravated by any weight bearing.  Patient has had prior knee problems. Evaluation to date: plain films: abnormal - soft tissue swelling with no acute bony or joint abnormality identified. Treatment to date: corticosteroid injection which was effective and Voltaren gel which was effective.    ROS: Negative unless specifically indicated above in HPI.   Relevant past medical history reviewed and updated as indicated.   Allergies and medications reviewed and updated.   Current Outpatient Medications:  .  aspirin 81 MG tablet, Take 81 mg by mouth daily., Disp: , Rfl:  .  b complex vitamins capsule, Take 1 capsule by mouth daily., Disp: , Rfl:  .   Biotin 10 MG TABS, Take 4 tablets by mouth daily., Disp: , Rfl:  .  calcium carbonate (OS-CAL) 600 MG TABS tablet, Take 600 mg by mouth daily with breakfast., Disp: , Rfl:  .  denosumab (PROLIA) 60 MG/ML SOLN injection, Inject 60 mg into the skin every 6 (six) months. Administer in upper arm, thigh, or abdomen, Disp: 1 mL, Rfl: 0 .  Ferrous Sulfate (IRON) 325 (65 Fe) MG TABS, Take 1 tablet by mouth once daily, Disp: 30 tablet, Rfl: 0 .  gabapentin (NEURONTIN) 100 MG capsule, TAKE 1 CAPSULE BY MOUTH THREE TIMES DAILY, Disp: 90 capsule, Rfl: 0 .  glucose blood (ONE TOUCH ULTRA TEST) test strip, USE TO CHECK GLUCOSE ONCE DAILY   E11.9, Disp: 100 each, Rfl: 9 .  lisinopril (ZESTRIL) 5 MG tablet, Take 1 tablet (5 mg total) by mouth daily., Disp: 90 tablet, Rfl: 1 .  metFORMIN (GLUCOPHAGE) 500 MG tablet, Take 1 tablet (500 mg total) by mouth 2 (two) times daily., Disp: 180 tablet, Rfl: 1 .  ONETOUCH DELICA LANCETS 99991111 MISC, USE ONE  TO CHECK GLUCOSE ONCE DAILY, Disp: 100 each, Rfl: 9 .  simvastatin (ZOCOR) 20 MG tablet, Take 1 tablet (20 mg total) by mouth daily., Disp: 90 tablet, Rfl: 1 .  predniSONE (STERAPRED UNI-PAK 21 TAB) 10 MG (21) TBPK tablet, Use as directed on back of pill pack, Disp: 21 tablet, Rfl: 0  Allergies  Allergen Reactions  .  Codeine Other (See Comments)    Hallucinations.    Objective:   BP 128/71   Pulse 65   Temp (!) 97.5 F (36.4 C) (Temporal)   Ht 5\' 4"  (1.626 m)   Wt 141 lb 6.4 oz (64.1 kg)   SpO2 100%   BMI 24.27 kg/m    Physical Exam Vitals signs reviewed.  Constitutional:      General: She is not in acute distress.    Appearance: Normal appearance. She is not ill-appearing, toxic-appearing or diaphoretic.  HENT:     Head: Normocephalic and atraumatic.  Eyes:     General: No scleral icterus.       Right eye: No discharge.        Left eye: No discharge.     Conjunctiva/sclera: Conjunctivae normal.  Neck:     Musculoskeletal: Normal range of motion.   Cardiovascular:     Rate and Rhythm: Normal rate and regular rhythm.     Heart sounds: Murmur present. Systolic murmur present with a grade of 2/6. No friction rub. No gallop.   Pulmonary:     Effort: Pulmonary effort is normal. No respiratory distress.     Breath sounds: Normal breath sounds. No stridor. No wheezing, rhonchi or rales.  Musculoskeletal: Normal range of motion.     Left knee: She exhibits normal range of motion, no swelling, no effusion, no ecchymosis, no deformity, no laceration, no erythema, normal alignment, no LCL laxity, normal patellar mobility, no bony tenderness, normal meniscus and no MCL laxity. Tenderness (medial aspect above knee down below knee) found.     Comments: Negative Lachman, anterior drawer, and McMurray tests.   Skin:    General: Skin is warm and dry.     Capillary Refill: Capillary refill takes less than 2 seconds.  Neurological:     General: No focal deficit present.     Mental Status: She is alert and oriented to person, place, and time. Mental status is at baseline.  Psychiatric:        Mood and Affect: Mood normal.        Behavior: Behavior normal.        Thought Content: Thought content normal.        Judgment: Judgment normal.

## 2019-08-17 NOTE — Patient Instructions (Signed)

## 2019-08-17 NOTE — Telephone Encounter (Signed)
Will have to check with jan in triage

## 2019-08-18 NOTE — Telephone Encounter (Signed)
Information regarding your request The patient currently has access to the requested medication and a Prior Authorization is not needed for the patient/medication.  Above statement via [email or web address] when I tried to do a prior authorization.

## 2019-08-22 ENCOUNTER — Other Ambulatory Visit: Payer: Self-pay

## 2019-08-23 ENCOUNTER — Encounter: Payer: Self-pay | Admitting: Nurse Practitioner

## 2019-08-23 ENCOUNTER — Ambulatory Visit (INDEPENDENT_AMBULATORY_CARE_PROVIDER_SITE_OTHER): Payer: Medicare HMO | Admitting: Nurse Practitioner

## 2019-08-23 ENCOUNTER — Other Ambulatory Visit: Payer: Self-pay

## 2019-08-23 VITALS — BP 118/64 | HR 66 | Temp 96.2°F | Resp 18 | Ht 64.0 in | Wt 138.0 lb

## 2019-08-23 DIAGNOSIS — D509 Iron deficiency anemia, unspecified: Secondary | ICD-10-CM

## 2019-08-23 DIAGNOSIS — I10 Essential (primary) hypertension: Secondary | ICD-10-CM

## 2019-08-23 DIAGNOSIS — E782 Mixed hyperlipidemia: Secondary | ICD-10-CM

## 2019-08-23 DIAGNOSIS — E1143 Type 2 diabetes mellitus with diabetic autonomic (poly)neuropathy: Secondary | ICD-10-CM

## 2019-08-23 DIAGNOSIS — E118 Type 2 diabetes mellitus with unspecified complications: Secondary | ICD-10-CM

## 2019-08-23 DIAGNOSIS — K219 Gastro-esophageal reflux disease without esophagitis: Secondary | ICD-10-CM | POA: Diagnosis not present

## 2019-08-23 DIAGNOSIS — L989 Disorder of the skin and subcutaneous tissue, unspecified: Secondary | ICD-10-CM

## 2019-08-23 DIAGNOSIS — D649 Anemia, unspecified: Secondary | ICD-10-CM

## 2019-08-23 DIAGNOSIS — M810 Age-related osteoporosis without current pathological fracture: Secondary | ICD-10-CM

## 2019-08-23 LAB — BAYER DCA HB A1C WAIVED: HB A1C (BAYER DCA - WAIVED): 6 % (ref <7.0)

## 2019-08-23 MED ORDER — DENOSUMAB 60 MG/ML ~~LOC~~ SOSY
60.0000 mg | PREFILLED_SYRINGE | Freq: Once | SUBCUTANEOUS | Status: AC
  Administered 2019-08-23: 60 mg via SUBCUTANEOUS

## 2019-08-23 MED ORDER — SIMVASTATIN 20 MG PO TABS: 20.0000 mg | ORAL_TABLET | Freq: Every day | ORAL | 1 refills | Status: DC

## 2019-08-23 MED ORDER — LISINOPRIL 5 MG PO TABS: 5.0000 mg | ORAL_TABLET | Freq: Every day | ORAL | 1 refills | Status: DC

## 2019-08-23 MED ORDER — METFORMIN HCL 500 MG PO TABS: 500.0000 mg | ORAL_TABLET | Freq: Two times a day (BID) | ORAL | 1 refills | Status: DC

## 2019-08-23 MED ORDER — IRON 325 (65 FE) MG PO TABS: 1.0000 | ORAL_TABLET | Freq: Every day | ORAL | 1 refills | Status: DC

## 2019-08-23 MED ORDER — GABAPENTIN 100 MG PO CAPS: 100.0000 mg | ORAL_CAPSULE | Freq: Three times a day (TID) | ORAL | 1 refills | Status: DC

## 2019-08-23 NOTE — Progress Notes (Signed)
Subjective:    Patient ID: Lindsay Villarreal, female    DOB: 1946-08-25, 73 y.o.   MRN: [id number]   Chief Complaint: Medical Management of Chronic Issues    HPI:  1. Essential hypertension No c/o chest pain, sob or headache. Does not check blood pressure at home. BP Readings from Last 3 Encounters:  08/23/19 118/64  08/17/19 128/71  05/20/19 134/67     2. Mixed hyperlipidemia Does not watch diet and does very little dedicated exercise. Lab Results  Component Value Date   CHOL 115 01/28/2019   HDL 45 01/28/2019   LDLCALC 41 01/28/2019   TRIG 144 01/28/2019   CHOLHDL 2.6 01/28/2019     3. Type 2 diabetes mellitus with complication, without long-term current use of insulin (HCC) Fasting blood sugars run in 110-120's. Tries to watch carbs in diet. HAS BEEN ON STERIOD THE LAST FEW DAYS FOR KNEE PAIN AND BLOOD SUGAR HAS BEEN RUNNING HIGH. Lab Results  Component Value Date   HGBA1C 6.4 05/20/2019    4. Gastroesophageal reflux disease without esophagitis Only uses otc meds when needed- usually has to take 1-2 x a week.  5. Diabetic autonomic neuropathy associated with type 2 diabetes mellitus (HCC) Burning of bil feet at times along with numbness of let foot under her toes.  6. Iron deficiency anemia, unspecified iron deficiency anemia type Denies fatigue Lab Results  Component Value Date   HGB 13.2 01/28/2019     7. Age-related osteoporosis without current pathological fracture Last dexascan was done 02/24/17. She is on prolia. deness any back pain.    Outpatient Encounter Medications as of 08/23/2019  Medication Sig  . aspirin 81 MG tablet Take 81 mg by mouth daily.  [name] b complex vitamins capsule Take 1 capsule by mouth daily.  . Biotin 10 MG TABS Take 4 tablets by mouth daily.  . calcium carbonate (OS-CAL) 600 MG TABS tablet Take 600 mg by mouth daily with breakfast.  . denosumab (PROLIA) 60 MG/ML SOLN injection Inject 60 mg into the skin every 6 (six) months.  Administer in upper arm, thigh, or abdomen  . Ferrous Sulfate (IRON) 325 (65 Fe) MG TABS Take 1 tablet by mouth once daily  . gabapentin (NEURONTIN) 100 MG capsule TAKE 1 CAPSULE BY MOUTH THREE TIMES DAILY  . glucose blood (ONE TOUCH ULTRA TEST) test strip USE TO CHECK GLUCOSE ONCE DAILY   E11.9  . lisinopril (ZESTRIL) 5 MG tablet Take 1 tablet (5 mg total) by mouth daily.  . metFORMIN (GLUCOPHAGE) 500 MG tablet Take 1 tablet (500 mg total) by mouth 2 (two) times daily.  [name] LANCETS 79X MISC USE ONE  TO CHECK GLUCOSE ONCE DAILY  . predniSONE (STERAPRED UNI-PAK 21 TAB) 10 MG (21) TBPK tablet Use as directed on back of pill pack  . simvastatin (ZOCOR) 20 MG tablet Take 1 tablet (20 mg total) by mouth daily.     Past Surgical History:  Procedure Laterality Date  . APPENDECTOMY  1978  . BLADDER REPAIR    . CATARACT EXTRACTION W/ INTRAOCULAR LENS IMPLANT  2010 (left), 2013 (right)  . COLONOSCOPY    . EYE SURGERY    . POLYPECTOMY    . TUBAL LIGATION    . VAGINAL HYSTERECTOMY  1978    Family History  Problem Relation Age of Onset  . Stomach cancer Father   . Pancreatic cancer Father   . Liver cancer Father   . Other Sister 107  Cancer of Bile Duct  . Breast cancer Sister   . Diabetes Sister   . Brain cancer Sister 92  . Heart attack Mother   . Heart failure Mother   . Sarcoidosis Sister   . Colon cancer Neg Hx   . Esophageal cancer Neg Hx   . Rectal cancer Neg Hx     New complaints: Forehead lesion is getting larger. Has been there for 5-6 years  Social history: Lives with husband and they are both very active.  Controlled substance contract: n/a'   Review of Systems  Constitutional: Negative for activity change and appetite change.  HENT: Negative.   Eyes: Negative for pain.  Respiratory: Negative for shortness of breath.   Cardiovascular: Negative for chest pain, palpitations and leg swelling.  Gastrointestinal: Negative for abdominal pain.   Endocrine: Negative for polydipsia.  Genitourinary: Negative.   Skin: Negative for rash.  Neurological: Negative for dizziness, weakness and headaches.  Hematological: Does not bruise/bleed easily.  Psychiatric/Behavioral: Negative.   All other systems reviewed and are negative.      Objective:   Physical Exam Vitals signs and nursing note reviewed.  Constitutional:      General: She is not in acute distress.    Appearance: Normal appearance. She is well-developed.  HENT:     Head: Normocephalic.     Nose: Nose normal.  Eyes:     Pupils: Pupils are equal, round, and reactive to light.  Neck:     Musculoskeletal: Normal range of motion and neck supple.     Vascular: No carotid bruit or JVD.  Cardiovascular:     Rate and Rhythm: Normal rate and regular rhythm.     Heart sounds: Normal heart sounds.  Pulmonary:     Effort: Pulmonary effort is normal. No respiratory distress.     Breath sounds: Normal breath sounds. No wheezing or rales.  Chest:     Chest wall: No tenderness.  Abdominal:     General: Bowel sounds are normal. There is no distension or abdominal bruit.     Palpations: Abdomen is soft. There is no hepatomegaly, splenomegaly, mass or pulsatile mass.     Tenderness: There is no abdominal tenderness.  Musculoskeletal: Normal range of motion.  Lymphadenopathy:     Cervical: No cervical adenopathy.  Skin:    General: Skin is warm and dry.  Neurological:     Mental Status: She is alert and oriented to person, place, and time.     Deep Tendon Reflexes: Reflexes are normal and symmetric.  Psychiatric:        Behavior: Behavior normal.        Thought Content: Thought content normal.        Judgment: Judgment normal.    BP 118/64   Pulse 66   Temp (!) 96.2 F (35.7 C) (Temporal)   Resp 18   Ht '5\' 4"'  (1.626 m)   Wt 138 lb (62.6 kg)   SpO2 100%   BMI 23.69 kg/m    HGBA1c 6.0%     Assessment & Plan:  Lindsay Villarreal comes in today with chief complaint  of Medical Management of Chronic Issues   Diagnosis and orders addressed:  1. Essential hypertension Low sodium diet - CMP14+EGFR - lisinopril (ZESTRIL) 5 MG tablet; Take 1 tablet (5 mg total) by mouth daily.  Dispense: 90 tablet; Refill: 1  2. Mixed hyperlipidemia Low fat diet - Lipid panel - simvastatin (ZOCOR) 20 MG tablet; Take 1 tablet (20 mg  total) by mouth daily.  Dispense: 90 tablet; Refill: 1  3. Type 2 diabetes mellitus with complication, without long-term current use of insulin (HCC) Continue to watch carbs in diet - Bayer DCA Hb A1c Waived - metFORMIN (GLUCOPHAGE) 500 MG tablet; Take 1 tablet (500 mg total) by mouth 2 (two) times daily.  Dispense: 180 tablet; Refill: 1  4. Gastroesophageal reflux disease without esophagitis Avoid spicy foods Do not eat 2 hours prior to bedtime   5. Diabetic autonomic neuropathy associated with type 2 diabetes mellitus ([place]) Do not go barefooted Check feet daily - gabapentin (NEURONTIN) 100 MG capsule; Take 1 capsule (100 mg total) by mouth 3 (three) times daily.  Dispense: 90 capsule; Refill: 1  6. Iron deficiency anemia, unspecified iron deficiency anemia type Continue daily iron supplement - Ferrous Sulfate (IRON) 325 (65 Fe) MG TABS; Take 1 tablet (325 mg total) by mouth daily.  Dispense: 90 tablet; Refill: 1  7. Age-related osteoporosis without current pathological fracture Weight bearing exercise Does not want to go to [facility] for [place]. Wants to wait on having it done here.    Labs pending Health Maintenance reviewed Diet and exercise encouraged  Follow up plan: 3 months   Mary-Margaret Hassell Done, FNP

## 2019-08-23 NOTE — Patient Instructions (Signed)
Diabetes Mellitus and Foot Care Foot care is an important part of your health, especially when you have diabetes. Diabetes may cause you to have problems because of poor blood flow (circulation) to your feet and legs, which can cause your skin to:  Become thinner and drier.  Break more easily.  Heal more slowly.  Peel and crack. You may also have nerve damage (neuropathy) in your legs and feet, causing decreased feeling in them. This means that you may not notice minor injuries to your feet that could lead to more serious problems. Noticing and addressing any potential problems early is the best way to prevent future foot problems. How to care for your feet Foot hygiene  Wash your feet daily with warm water and mild soap. Do not use hot water. Then, pat your feet and the areas between your toes until they are completely dry. Do not soak your feet as this can dry your skin.  Trim your toenails straight across. Do not dig under them or around the cuticle. File the edges of your nails with an emery board or nail file.  Apply a moisturizing lotion or petroleum jelly to the skin on your feet and to dry, brittle toenails. Use lotion that does not contain alcohol and is unscented. Do not apply lotion between your toes. Shoes and socks  Wear clean socks or stockings every day. Make sure they are not too tight. Do not wear knee-high stockings since they may decrease blood flow to your legs.  Wear shoes that fit properly and have enough cushioning. Always look in your shoes before you put them on to be sure there are no objects inside.  To break in new shoes, wear them for just a few hours a day. This prevents injuries on your feet. Wounds, scrapes, corns, and calluses  Check your feet daily for blisters, cuts, bruises, sores, and redness. If you cannot see the bottom of your feet, use a mirror or ask someone for help.  Do not cut corns or calluses or try to remove them with medicine.  If you  find a minor scrape, cut, or break in the skin on your feet, keep it and the skin around it clean and dry. You may clean these areas with mild soap and water. Do not clean the area with peroxide, alcohol, or iodine.  If you have a wound, scrape, corn, or callus on your foot, look at it several times a day to make sure it is healing and not infected. Check for: ? Redness, swelling, or pain. ? Fluid or blood. ? Warmth. ? Pus or a bad smell. General instructions  Do not cross your legs. This may decrease blood flow to your feet.  Do not use heating pads or hot water bottles on your feet. They may burn your skin. If you have lost feeling in your feet or legs, you may not know this is happening until it is too late.  Protect your feet from hot and cold by wearing shoes, such as at the beach or on hot pavement.  Schedule a complete foot exam at least once a year (annually) or more often if you have foot problems. If you have foot problems, report any cuts, sores, or bruises to your health care provider immediately. Contact a health care provider if:  You have a medical condition that increases your risk of infection and you have any cuts, sores, or bruises on your feet.  You have an injury that is not   healing.  You have redness on your legs or feet.  You feel burning or tingling in your legs or feet.  You have pain or cramps in your legs and feet.  Your legs or feet are numb.  Your feet always feel cold.  You have pain around a toenail. Get help right away if:  You have a wound, scrape, corn, or callus on your foot and: ? You have pain, swelling, or redness that gets worse. ? You have fluid or blood coming from the wound, scrape, corn, or callus. ? Your wound, scrape, corn, or callus feels warm to the touch. ? You have pus or a bad smell coming from the wound, scrape, corn, or callus. ? You have a fever. ? You have a red line going up your leg. Summary  Check your feet every day  for cuts, sores, red spots, swelling, and blisters.  Moisturize feet and legs daily.  Wear shoes that fit properly and have enough cushioning.  If you have foot problems, report any cuts, sores, or bruises to your health care provider immediately.  Schedule a complete foot exam at least once a year (annually) or more often if you have foot problems. This information is not intended to replace advice given to you by your health care provider. Make sure you discuss any questions you have with your health care provider. Document Released: 10/31/2000 Document Revised: 12/16/2017 Document Reviewed: 12/05/2016 Elsevier Patient Education  2020 [employer]  

## 2019-08-23 NOTE — Addendum Note (Signed)
Addended by: Rolena Infante on: 08/23/2019 09:17 AM   Modules accepted: Orders

## 2019-08-24 LAB — CMP14+EGFR
ALT: 13 IU/L (ref 0–32)
AST: 11 IU/L (ref 0–40)
Albumin/Globulin Ratio: 2.4 — ABNORMAL HIGH (ref 1.2–2.2)
Albumin: 4.3 g/dL (ref 3.7–4.7)
Alkaline Phosphatase: 60 IU/L (ref 39–117)
BUN/Creatinine Ratio: 27 (ref 12–28)
BUN: 19 mg/dL (ref 8–27)
Bilirubin Total: 0.3 mg/dL (ref 0.0–1.2)
CO2: 28 mmol/L (ref 20–29)
Calcium: 9.1 mg/dL (ref 8.7–10.3)
Chloride: 101 mmol/L (ref 96–106)
Creatinine, Ser: 0.7 mg/dL (ref 0.57–1.00)
GFR calc Af Amer: 99 mL/min/{1.73_m2} (ref >59)
GFR calc non Af Amer: 86 mL/min/{1.73_m2} (ref >59)
Globulin, Total: 1.8 g/dL (ref 1.5–4.5)
Glucose: 96 mg/dL (ref 65–99)
Potassium: 3.6 mmol/L (ref 3.5–5.2)
Sodium: 142 mmol/L (ref 134–144)
Total Protein: 6.1 g/dL (ref 6.0–8.5)

## 2019-08-24 LAB — LIPID PANEL
Chol/HDL Ratio: 1.9 ratio (ref 0.0–4.4)
Cholesterol, Total: 102 mg/dL (ref 100–199)
HDL: 54 mg/dL (ref >39)
LDL Chol Calc (NIH): 32 mg/dL (ref 0–99)
Triglycerides: 80 mg/dL (ref 0–149)
VLDL Cholesterol Cal: 16 mg/dL (ref 5–40)

## 2019-08-30 ENCOUNTER — Other Ambulatory Visit: Payer: Self-pay

## 2019-08-30 ENCOUNTER — Ambulatory Visit (INDEPENDENT_AMBULATORY_CARE_PROVIDER_SITE_OTHER): Payer: Medicare HMO | Admitting: *Deleted

## 2019-08-30 DIAGNOSIS — Z0000 Encounter for general adult medical examination without abnormal findings: Secondary | ICD-10-CM | POA: Diagnosis not present

## 2019-08-30 NOTE — Patient Instructions (Signed)
Preventive Care 38 Years and Older, Female Preventive care refers to lifestyle choices and visits with your health care provider that can promote health and wellness. This includes:  A yearly physical exam. This is also called an annual well check.  Regular dental and eye exams.  Immunizations.  Screening for certain conditions.  Healthy lifestyle choices, such as diet and exercise. What can I expect for my preventive care visit? Physical exam Your health care provider will check:  Height and weight. These may be used to calculate body mass index (BMI), which is a measurement that tells if you are at a healthy weight.  Heart rate and blood pressure.  Your skin for abnormal spots. Counseling Your health care provider may ask you questions about:  Alcohol, tobacco, and drug use.  Emotional well-being.  Home and relationship well-being.  Sexual activity.  Eating habits.  History of falls.  Memory and ability to understand (cognition).  Work and work Statistician.  Pregnancy and menstrual history. What immunizations do I need?  Influenza (flu) vaccine  This is recommended every year. Tetanus, diphtheria, and pertussis (Tdap) vaccine  You may need a Td booster every 10 years. Varicella (chickenpox) vaccine  You may need this vaccine if you have not already been vaccinated. Zoster (shingles) vaccine  You may need this after age 33. Pneumococcal conjugate (PCV13) vaccine  One dose is recommended after age 33. Pneumococcal polysaccharide (PPSV23) vaccine  One dose is recommended after age 72. Measles, mumps, and rubella (MMR) vaccine  You may need at least one dose of MMR if you were born in 1957 or later. You may also need a second dose. Meningococcal conjugate (MenACWY) vaccine  You may need this if you have certain conditions. Hepatitis A vaccine  You may need this if you have certain conditions or if you travel or work in places where you may be exposed  to hepatitis A. Hepatitis B vaccine  You may need this if you have certain conditions or if you travel or work in places where you may be exposed to hepatitis B. Haemophilus influenzae type b (Hib) vaccine  You may need this if you have certain conditions. You may receive vaccines as individual doses or as more than one vaccine together in one shot (combination vaccines). Talk with your health care provider about the risks and benefits of combination vaccines. What tests do I need? Blood tests  Lipid and cholesterol levels. These may be checked every 5 years, or more frequently depending on your overall health.  Hepatitis C test.  Hepatitis B test. Screening  Lung cancer screening. You may have this screening every year starting at age 39 if you have a 30-pack-year history of smoking and currently smoke or have quit within the past 15 years.  Colorectal cancer screening. All adults should have this screening starting at age 36 and continuing until age 15. Your health care provider may recommend screening at age 23 if you are at increased risk. You will have tests every 1-10 years, depending on your results and the type of screening test.  Diabetes screening. This is done by checking your blood sugar (glucose) after you have not eaten for a while (fasting). You may have this done every 1-3 years.  Mammogram. This may be done every 1-2 years. Talk with your health care provider about how often you should have regular mammograms.  BRCA-related cancer screening. This may be done if you have a family history of breast, ovarian, tubal, or peritoneal cancers.  Other tests  Sexually transmitted disease (STD) testing.  Bone density scan. This is done to screen for osteoporosis. You may have this done starting at age 76. Follow these instructions at home: Eating and drinking  Eat a diet that includes fresh fruits and vegetables, whole grains, lean protein, and low-fat dairy products. Limit  your intake of foods with high amounts of sugar, saturated fats, and salt.  Take vitamin and mineral supplements as recommended by your health care provider.  Do not drink alcohol if your health care provider tells you not to drink.  If you drink alcohol: ? Limit how much you have to 0-1 drink a day. ? Be aware of how much alcohol is in your drink. In the [place] one drink equals one 12 oz bottle of beer (355 mL), one 5 oz glass of wine (148 mL), or one 1 oz glass of hard liquor (44 mL). Lifestyle  Take daily care of your teeth and gums.  Stay active. Exercise for at least 30 minutes on 5 or more days each week.  Do not use any products that contain nicotine or tobacco, such as cigarettes, e-cigarettes, and chewing tobacco. If you need help quitting, ask your health care provider.  If you are sexually active, practice safe sex. Use a condom or other form of protection in order to prevent STIs (sexually transmitted infections).  Talk with your health care provider about taking a low-dose aspirin or statin. What's next?  Go to your health care provider once a year for a well check visit.  Ask your health care provider how often you should have your eyes and teeth checked.  Stay up to date on all vaccines. This information is not intended to replace advice given to you by your health care provider. Make sure you discuss any questions you have with your health care provider. Document Released: 11/30/2015 Document Revised: 10/28/2018 Document Reviewed: 10/28/2018 Elsevier Patient Education  2020 [employer].

## 2019-08-30 NOTE — Progress Notes (Addendum)
MEDICARE ANNUAL WELLNESS VISIT  08/30/2019  Telephone Visit Disclaimer This Medicare AWV was conducted by telephone due to national recommendations for restrictions regarding the COVID-19 Pandemic (e.g. social distancing).  I verified, using two identifiers, that I am speaking with Lindsay Villarreal or their authorized healthcare agent. I discussed the limitations, risks, security, and privacy concerns of performing an evaluation and management service by telephone and the potential availability of an in-person appointment in the future. The patient expressed understanding and agreed to proceed.   Subjective:  Lindsay Villarreal is a 73 y.o. female patient of [name], [place] who had a Medicare Annual Wellness Visit today via telephone. Marka is Retired and lives alone. she has 3 children. she reports that she is socially active and does interact with friends/family regularly. she is minimally physically active and enjoys reading.  Patient Care Team: [name], FNP as PCP - General (Nurse Practitioner) Okey Regal, [place] as Consulting Physician (Optometry) Beryle Beams Alyson Locket, MD as Consulting Physician (Hematology) Irene Shipper, MD as Consulting Physician (Gastroenterology)  Advanced Directives 08/30/2019 03/09/2017 02/18/2016 04/18/2015 02/12/2015  Does Patient Have a Medical Advance Directive? No No No No No  Would patient like information on creating a medical advance directive? No - Patient declined Yes (MAU/Ambulatory/Procedural Areas - Information given) Yes - Educational materials given No - patient declined information Yes - Educational materials given    Hospital Utilization Over the Past 12 Months: # of hospitalizations or ER visits: 0 # of surgeries: 0  Review of Systems    Patient reports that her overall health is unchanged compared to last year.  History obtained from chart review  Patient Reported Readings (BP, Pulse, CBG, Weight, etc) none  Pain  Assessment       Current Medications & Allergies (verified) Allergies as of 08/30/2019      Reactions   Codeine Other (See Comments)   Hallucinations.      Medication List       Accurate as of August 30, 2019  8:40 AM. If you have any questions, ask your nurse or doctor.        aspirin 81 MG tablet Take 81 mg by mouth daily.   b complex vitamins capsule Take 1 capsule by mouth daily.   Biotin 10 MG Tabs Take 4 tablets by mouth daily.   calcium carbonate 600 MG Tabs tablet Commonly known as: OS-CAL Take 600 mg by mouth daily with breakfast.   denosumab 60 MG/ML Soln injection Commonly known as: PROLIA Inject 60 mg into the skin every 6 (six) months. Administer in upper arm, thigh, or abdomen   gabapentin 100 MG capsule Commonly known as: NEURONTIN Take 1 capsule (100 mg total) by mouth 3 (three) times daily.   glucose blood test strip Commonly known as: ONE TOUCH ULTRA TEST USE TO CHECK GLUCOSE ONCE DAILY   E11.9   Iron 325 (65 Fe) MG Tabs Take 1 tablet (325 mg total) by mouth daily.   lisinopril 5 MG tablet Commonly known as: ZESTRIL Take 1 tablet (5 mg total) by mouth daily.   metFORMIN 500 MG tablet Commonly known as: GLUCOPHAGE Take 1 tablet (500 mg total) by mouth 2 (two) times daily.   OneTouch Delica Lancets 99991111 Misc USE ONE  TO CHECK GLUCOSE ONCE DAILY   simvastatin 20 MG tablet Commonly known as: ZOCOR Take 1 tablet (20 mg total) by mouth daily.       History (reviewed): Past Medical History:  Diagnosis  Date  . Anemia   . Arthritis   . Cataract    bil cateracts removed  . Diabetes mellitus without complication ([place])   . GERD (gastroesophageal reflux disease)   . Heart murmur   . Hyperlipidemia   . Hypertension   . Neuromuscular disorder ([place])   . Osteoporosis    Past Surgical History:  Procedure Laterality Date  . APPENDECTOMY  1978  . BLADDER REPAIR    . CATARACT EXTRACTION W/ INTRAOCULAR LENS IMPLANT  2010 (left), 2013  (right)  . COLONOSCOPY    . EYE SURGERY    . POLYPECTOMY    . TUBAL LIGATION    . VAGINAL HYSTERECTOMY  1978   Family History  Problem Relation Age of Onset  . Stomach cancer Father   . Pancreatic cancer Father   . Liver cancer Father   . Other Sister 53       Cancer of Bile Duct  . Breast cancer Sister   . Diabetes Sister   . Brain cancer Sister 35  . Heart attack Mother   . Heart failure Mother   . Sarcoidosis Sister   . Colon cancer Neg Hx   . Esophageal cancer Neg Hx   . Rectal cancer Neg Hx    Social History   Socioeconomic History  . Marital status: Divorced    Spouse name: Not on file  . Number of children: 3  . Years of education: 49  . Highest education level: High school graduate  Occupational History  . 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 Financial resource strain: Not very hard  . Food insecurity    Worry: Never true    Inability: Never true  . Transportation needs    Medical: No    Non-medical: No  Tobacco Use  . Smoking status: Current Every Day Smoker    Packs/day: 0.50    Years: 50.00    Pack years: 25.00    Types: Cigarettes  . Smokeless tobacco: Never Used  Substance and Sexual Activity  . Alcohol use: No  . Drug use: No  . Sexual activity: Not Currently  Lifestyle  . Physical activity    Days per week: 0 days    Minutes per session: 0 min  . Stress: Not at all  Relationships  . Social connections    Talks on phone: More than three times a week    Gets together: More than three times a week    Attends religious service: Never    Active member of club or organization: No    Attends meetings of clubs or organizations: Never    Relationship status: Divorced  Other Topics Concern  . Not on file  Social History Narrative  . Not on file    Activities of Daily Living In your present state of health, do you have any difficulty performing the following activities: 08/30/2019  Hearing? N   Vision? N  Comment wears reading glasses-gets yearly eye exams  Difficulty concentrating or making decisions? N  Walking or climbing stairs? Y  Comment pt says her right knee bothers her sometimes if she has to walk up too many stairs  Dressing or bathing? N  Doing errands, shopping? N  Preparing Food and eating ? N  Using the Toilet? N  In the past six months, have you accidently leaked urine? Y  Comment when getting up from sitting-has to urinate frequently-is looking for a GYN to go be  evaluated for a pessary  Do you have problems with loss of bowel control? N  Managing your Medications? N  Managing your Finances? N  Housekeeping or managing your Housekeeping? N  Some recent data might be hidden    Patient Education/ Literacy How often do you need to have someone help you when you read instructions, pamphlets, or other written materials from your doctor or pharmacy?: 1 - Never What is the last grade level you completed in school?: 12th grade  Exercise Current Exercise Habits: The patient does not participate in regular exercise at present, Exercise limited by: orthopedic condition(s)  Diet Patient reports consuming 3 meals a day and 2 snack(s) a day Patient reports that her primary diet is: Regular Patient reports that she does have regular access to food.   Depression Screen PHQ 2/9 Scores 08/30/2019 08/23/2019 05/20/2019 01/28/2019 10/29/2018 02/05/2018 10/05/2017  PHQ - 2 Score 0 0 0 0 1 0 0     Fall Risk Fall Risk  08/30/2019 08/23/2019 05/20/2019 01/28/2019 10/29/2018  Falls in the past year? 0 0 0 0 0  Number falls in past yr: 0 - - - -  Injury with Fall? 0 - - - -  Follow up Falls prevention discussed - - - -  Comment Get rid of all throw rugs in the house, adequate lighting in the walkways and grab bars in the bathroom. - - - -     Objective:  Lindsay Villarreal seemed alert and oriented and she participated appropriately during our telephone visit.  Blood Pressure  Weight BMI  BP Readings from Last 3 Encounters:  08/23/19 118/64  08/17/19 128/71  05/20/19 134/67   Wt Readings from Last 3 Encounters:  08/23/19 138 lb (62.6 kg)  08/17/19 141 lb 6.4 oz (64.1 kg)  05/20/19 143 lb (64.9 kg)   BMI Readings from Last 1 Encounters:  08/23/19 23.69 kg/m    *Unable to obtain current vital signs, weight, and BMI due to telephone visit type  Hearing/Vision  . Oval did not seem to have difficulty with hearing/understanding during the telephone conversation . Reports that she has not had a formal eye exam by an eye care professional within the past year . Reports that she has not had a formal hearing evaluation within the past year *Unable to fully assess hearing and vision during telephone visit type  Cognitive Function: 6CIT Screen 08/30/2019  What Year? 0 points  What month? 0 points  What time? 0 points  Count back from 20 0 points  Months in reverse 0 points  Repeat phrase 0 points  Total Score 0   (Normal:0-7, Significant for Dysfunction: >8)  Normal Cognitive Function Screening: Yes   Immunization & Health Maintenance Record Immunization History  Administered Date(s) Administered  . Hepatitis B 12/01/2018  . Hepatitis B, adult 02/22/2018, 12/01/2018  . Influenza, High Dose Seasonal PF 10/04/2015, 09/04/2016, 09/17/2017, 08/07/2018  . Influenza,inj,Quad PF,6+ Mos 09/09/2013, 08/14/2014  . Pneumococcal Conjugate-13 10/30/2014  . Pneumococcal Polysaccharide-23 07/30/2012  . Tdap 07/30/2012  . Zoster Recombinat (Shingrix) 08/07/2018, 11/13/2018    Health Maintenance  Topic Date Due  . OPHTHALMOLOGY EXAM  07/23/2018  . DEXA SCAN  02/25/2019  . INFLUENZA VACCINE  02/15/2020 (Originally 06/18/2019)  . LIPID PANEL  02/21/2020  . HEMOGLOBIN A1C  02/21/2020  . COLONOSCOPY  04/29/2020  . FOOT EXAM  05/19/2020  . MAMMOGRAM  09/14/2020  . TETANUS/TDAP  07/30/2022  . Hepatitis C Screening  Completed  . PNA vac Low  Risk Adult  Completed        Assessment  This is a routine wellness examination for [place] Maintenance: Due or Overdue Health Maintenance Due  Topic Date Due  . OPHTHALMOLOGY EXAM  07/23/2018  . DEXA SCAN  02/25/2019    Lindsay Villarreal does not need a referral for Community Assistance: Care Management:   no Social Work:    no Prescription Assistance:  no Nutrition/Diabetes Education:  no   Plan:  Personalized Goals Goals Addressed            This Visit's Progress   . DIET - INCREASE WATER INTAKE       Try to drink 6-8 glasses of water daily. Cut back on the diet Dr Malachi Bonds      Personalized Health Maintenance & Screening Recommendations  Influenza vaccine Bone densitometry screening Eye Exam  Lung Cancer Screening Recommended: no (Low Dose CT Chest recommended if Age 23-80 years, 30 pack-year currently smoking OR have quit w/in past 15 years) Hepatitis C Screening recommended: no HIV Screening recommended: no  Advanced Directives: Written information was not prepared per patient's request.  Referrals & Orders No orders of the defined types were placed in this encounter.   Follow-up Plan . Follow-up with [name], FNP as planned . Schedule your Eye Exam and DEXA as discussed . Get your Flu vaccine at your next visit with your PCP    I have personally reviewed and noted the following in the patient's chart:   . Medical and social history . Use of alcohol, tobacco or illicit drugs  . Current medications and supplements . Functional ability and status . Nutritional status . Physical activity . Advanced directives . List of other physicians . Hospitalizations, surgeries, and ER visits in previous 12 months . Vitals . Screenings to include cognitive, depression, and falls . Referrals and appointments  In addition, I have reviewed and discussed with Lindsay Villarreal certain preventive protocols, quality metrics, and best practice recommendations. A  written personalized care plan for preventive services as well as general preventive health recommendations is available and can be mailed to the patient at her request.      Myosha Cuadras, Donny Pique, LPN  [phone]     I have reviewed and agree with the above  documentation.   Evelina Dun, FNP

## 2019-09-13 DIAGNOSIS — L821 Other seborrheic keratosis: Secondary | ICD-10-CM | POA: Diagnosis not present

## 2019-09-13 DIAGNOSIS — D485 Neoplasm of uncertain behavior of skin: Secondary | ICD-10-CM | POA: Diagnosis not present

## 2019-09-13 DIAGNOSIS — R208 Other disturbances of skin sensation: Secondary | ICD-10-CM | POA: Diagnosis not present

## 2019-10-10 ENCOUNTER — Other Ambulatory Visit: Payer: Self-pay | Admitting: Nurse Practitioner

## 2019-11-18 ENCOUNTER — Other Ambulatory Visit: Payer: Self-pay | Admitting: Nurse Practitioner

## 2019-11-18 DIAGNOSIS — E1143 Type 2 diabetes mellitus with diabetic autonomic (poly)neuropathy: Secondary | ICD-10-CM

## 2019-12-01 ENCOUNTER — Other Ambulatory Visit: Payer: Self-pay

## 2019-12-02 ENCOUNTER — Encounter: Payer: Self-pay | Admitting: Nurse Practitioner

## 2019-12-02 ENCOUNTER — Ambulatory Visit (INDEPENDENT_AMBULATORY_CARE_PROVIDER_SITE_OTHER): Payer: Medicare HMO

## 2019-12-02 ENCOUNTER — Ambulatory Visit (INDEPENDENT_AMBULATORY_CARE_PROVIDER_SITE_OTHER): Payer: Medicare HMO | Admitting: Nurse Practitioner

## 2019-12-02 ENCOUNTER — Other Ambulatory Visit: Payer: Medicare HMO

## 2019-12-02 VITALS — BP 122/66 | HR 67 | Temp 97.5°F | Resp 20 | Ht 64.0 in | Wt 139.0 lb

## 2019-12-02 DIAGNOSIS — E1143 Type 2 diabetes mellitus with diabetic autonomic (poly)neuropathy: Secondary | ICD-10-CM

## 2019-12-02 DIAGNOSIS — D509 Iron deficiency anemia, unspecified: Secondary | ICD-10-CM

## 2019-12-02 DIAGNOSIS — Z780 Asymptomatic menopausal state: Secondary | ICD-10-CM

## 2019-12-02 DIAGNOSIS — E118 Type 2 diabetes mellitus with unspecified complications: Secondary | ICD-10-CM | POA: Diagnosis not present

## 2019-12-02 DIAGNOSIS — M810 Age-related osteoporosis without current pathological fracture: Secondary | ICD-10-CM

## 2019-12-02 DIAGNOSIS — I10 Essential (primary) hypertension: Secondary | ICD-10-CM | POA: Diagnosis not present

## 2019-12-02 DIAGNOSIS — E782 Mixed hyperlipidemia: Secondary | ICD-10-CM | POA: Diagnosis not present

## 2019-12-02 DIAGNOSIS — K219 Gastro-esophageal reflux disease without esophagitis: Secondary | ICD-10-CM

## 2019-12-02 LAB — BAYER DCA HB A1C WAIVED: HB A1C (BAYER DCA - WAIVED): 6.2 % (ref <7.0)

## 2019-12-02 MED ORDER — SIMVASTATIN 20 MG PO TABS: 20.0000 mg | ORAL_TABLET | Freq: Every day | ORAL | 1 refills | Status: DC

## 2019-12-02 MED ORDER — IRON 325 (65 FE) MG PO TABS: 1.0000 | ORAL_TABLET | Freq: Every day | ORAL | 1 refills | Status: DC

## 2019-12-02 MED ORDER — GABAPENTIN 100 MG PO CAPS: 100.0000 mg | ORAL_CAPSULE | Freq: Three times a day (TID) | ORAL | 1 refills | Status: DC

## 2019-12-02 MED ORDER — METFORMIN HCL 500 MG PO TABS: 500.0000 mg | ORAL_TABLET | Freq: Two times a day (BID) | ORAL | 1 refills | Status: DC

## 2019-12-02 MED ORDER — LISINOPRIL 5 MG PO TABS: 5.0000 mg | ORAL_TABLET | Freq: Every day | ORAL | 1 refills | Status: DC

## 2019-12-02 NOTE — Patient Instructions (Addendum)
COVID-19 Vaccine Information can be found at: ShippingScam.co.uk For questions related to vaccine distribution or appointments, please email [email or web address] or call [phone].     Bone Health Bones protect organs, store calcium, anchor muscles, and support the whole body. Keeping your bones strong is important, especially as you get older. You can take actions to help keep your bones strong and healthy. Why is keeping my bones healthy important?  Keeping your bones healthy is important because your body constantly replaces bone cells. Cells get old, and new cells take their place. As we age, we lose bone cells because the body may not be able to make enough new cells to replace the old cells. The amount of bone cells and bone tissue you have is referred to as bone mass. The higher your bone mass, the stronger your bones. The aging process leads to an overall loss of bone mass in the body, which can increase the likelihood of:  Joint pain and stiffness.  Broken bones.  A condition in which the bones become weak and brittle (osteoporosis). A large decline in bone mass occurs in older adults. In women, it occurs about the time of menopause. What actions can I take to keep my bones healthy? Good health habits are important for maintaining healthy bones. This includes eating nutritious foods and exercising regularly. To have healthy bones, you need to get enough of the right minerals and vitamins. Most nutrition experts recommend getting these nutrients from the foods that you eat. In some cases, taking supplements may also be recommended. Doing certain types of exercise is also important for bone health. What are the nutritional recommendations for healthy bones?  Eating a well-balanced diet with plenty of calcium and vitamin D will help to protect your bones. Nutritional recommendations vary from person to person. Ask your health  care provider what is healthy for you. Here are some general guidelines. Get enough calcium Calcium is the most important (essential) mineral for bone health. Most people can get enough calcium from their diet, but supplements may be recommended for people who are at risk for osteoporosis. Good sources of calcium include:  Dairy products, such as low-fat or nonfat milk, cheese, and yogurt.  Dark green leafy vegetables, such as bok choy and broccoli.  Calcium-fortified foods, such as orange juice, cereal, bread, soy beverages, and tofu products.  Nuts, such as almonds. Follow these recommended amounts for daily calcium intake:  Children, age 70-3: 700 mg.  Children, age 9-8: 1,000 mg.  Children, age 54-13: 1,300 mg.  Teens, age 701-18: 1,300 mg.  Adults, age 703-50: 1,000 mg.  Adults, age 74-70: ? Men: 1,000 mg. ? Women: 1,200 mg.  Adults, age 74 or older: 1,200 mg.  Pregnant and breastfeeding females: ? Teens: 1,300 mg. ? Adults: 1,000 mg. Get enough vitamin D Vitamin D is the most essential vitamin for bone health. It helps the body absorb calcium. Sunlight stimulates the skin to make vitamin D, so be sure to get enough sunlight. If you live in a cold climate or you do not get outside often, your health care provider may recommend that you take vitamin D supplements. Good sources of vitamin D in your diet include:  Egg yolks.  Saltwater fish.  Milk and cereal fortified with vitamin D. Follow these recommended amounts for daily vitamin D intake:  Children and teens, age 70-18: 600 international units.  Adults, age 8 or younger: 400-800 international units.  Adults, age 54 or older: 800-1,000 international units. Get other  important nutrients Other nutrients that are important for bone health include:  Phosphorus. This mineral is found in meat, poultry, dairy foods, nuts, and legumes. The recommended daily intake for adult men and adult women is 700 mg.  Magnesium. This  mineral is found in seeds, nuts, dark green vegetables, and legumes. The recommended daily intake for adult men is 400-420 mg. For adult women, it is 310-320 mg.  Vitamin K. This vitamin is found in green leafy vegetables. The recommended daily intake is 120 mg for adult men and 90 mg for adult women. What type of physical activity is best for building and maintaining healthy bones? Weight-bearing and strength-building activities are important for building and maintaining healthy bones. Weight-bearing activities cause muscles and bones to work against gravity. Strength-building activities increase the strength of the muscles that support bones. Weight-bearing and muscle-building activities include:  Walking and hiking.  Jogging and running.  Dancing.  Gym exercises.  Lifting weights.  Tennis and racquetball.  Climbing stairs.  Aerobics. Adults should get at least 30 minutes of moderate physical activity on most days. Children should get at least 60 minutes of moderate physical activity on most days. Ask your health care provider what type of exercise is best for you. How can I find out if my bone mass is low? Bone mass can be measured with an X-ray test called a bone mineral density (BMD) test. This test is recommended for all women who are age 58 or older. It may also be recommended for:  Men who are age 77 or older.  People who are at risk for osteoporosis because of: ? Having bones that break easily. ? Having a long-term disease that weakens bones, such as kidney disease or rheumatoid arthritis. ? Having menopause earlier than normal. ? Taking medicine that weakens bones, such as steroids, thyroid hormones, or hormone treatment for breast cancer or prostate cancer. ? Smoking. ? Drinking three or more alcoholic drinks a day. If you find that you have a low bone mass, you may be able to prevent osteoporosis or further bone loss by changing your diet and lifestyle. Where can I find  more information? For more information, check out the following websites:  [place]: AviationTales.fr  [employer] of Health: www.bones.SouthExposed.es  International Osteoporosis Foundation: Administrator.iofbonehealth.org Summary  The aging process leads to an overall loss of bone mass in the body, which can increase the likelihood of broken bones and osteoporosis.  Eating a well-balanced diet with plenty of calcium and vitamin D will help to protect your bones.  Weight-bearing and strength-building activities are also important for building and maintaining strong bones.  Bone mass can be measured with an X-ray test called a bone mineral density (BMD) test. This information is not intended to replace advice given to you by your health care provider. Make sure you discuss any questions you have with your health care provider. Document Revised: 11/30/2017 Document Reviewed: 11/30/2017 Elsevier Patient Education  2020 [employer].

## 2019-12-02 NOTE — Progress Notes (Signed)
Subjective:    Patient ID: Lindsay Villarreal, female    DOB: 20-May-1946, 74 y.o.   MRN: [id number]   Chief Complaint: Medical Management of Chronic Issues    HPI:  1. Essential hypertension No c/o chest pain, sob or headache. Does not check blood pressure at home.  BP Readings from Last 3 Encounters:  08/23/19 118/64  08/17/19 128/71  05/20/19 134/67     2. Mixed hyperlipidemia She does try to watch diet and tries to stay very active. Lab Results  Component Value Date   CHOL 102 08/23/2019   HDL 54 08/23/2019   LDLCALC 32 08/23/2019   TRIG 80 08/23/2019   CHOLHDL 1.9 08/23/2019     3. Type 2 diabetes mellitus with complication, without long-term current use of insulin (HCC) Fating blood sugar are below 120 consistently. She denise any symptoms of low blood sugars. Lab Results  Component Value Date   HGBA1C 6.0 08/23/2019     4. Diabetic autonomic neuropathy associated with type 2 diabetes mellitus (HCC) Has constant burning in her feet.she takes gabapentin whic really seems to help with  Burning sensation.  5. Age-related osteoporosis without current pathological fracture Patient is currently on prolia injections and is having dexascan today after completion of this appointment  6. Gastroesophageal reflux disease without esophagitis Has not had in heartburn symotoms as of late.  7. Iron deficiency anemia, unspecified iron deficiency anemia type patient takes a daily iron supplement. Lab Results  Component Value Date   HGB 13.2 01/28/2019       Outpatient Encounter Medications as of 12/02/2019  Medication Sig  . aspirin 81 MG tablet Take 81 mg by mouth daily.  [name] b complex vitamins capsule Take 1 capsule by mouth daily.  . Biotin 10 MG TABS Take 4 tablets by mouth daily.  . calcium carbonate (OS-CAL) 600 MG TABS tablet Take 600 mg by mouth daily with breakfast.  . denosumab (PROLIA) 60 MG/ML SOLN injection Inject 60 mg into the skin every 6 (six) months.  Administer in upper arm, thigh, or abdomen  . Ferrous Sulfate (IRON) 325 (65 Fe) MG TABS Take 1 tablet (325 mg total) by mouth daily.  [name] gabapentin (NEURONTIN) 100 MG capsule TAKE 1 CAPSULE BY MOUTH THREE TIMES DAILY  . glucose blood (ONE TOUCH ULTRA TEST) test strip USE TO CHECK GLUCOSE ONCE DAILY   E11.9  . lisinopril (ZESTRIL) 5 MG tablet Take 1 tablet (5 mg total) by mouth daily.  . metFORMIN (GLUCOPHAGE) 500 MG tablet Take 1 tablet (500 mg total) by mouth 2 (two) times daily.  [name] Lancets 45T MISC USE ONE  TO CHECK GLUCOSE ONCE DAILY Dx E11.40  . simvastatin (ZOCOR) 20 MG tablet Take 1 tablet (20 mg total) by mouth daily.     Past Surgical History:  Procedure Laterality Date  . APPENDECTOMY  1978  . BLADDER REPAIR    . CATARACT EXTRACTION W/ INTRAOCULAR LENS IMPLANT  2010 (left), 2013 (right)  . COLONOSCOPY    . EYE SURGERY    . POLYPECTOMY    . TUBAL LIGATION    . VAGINAL HYSTERECTOMY  1978    Family History  Problem Relation Age of Onset  . Stomach cancer Father   . Pancreatic cancer Father   . Liver cancer Father   . Other Sister 75       Cancer of Bile Duct  . Breast cancer Sister   . Diabetes Sister   . Brain cancer Sister  30  . Heart attack Mother   . Heart failure Mother   . Sarcoidosis Sister   . Colon cancer Neg Hx   . Esophageal cancer Neg Hx   . Rectal cancer Neg Hx     New complaints: none  Social history:  today  Controlled substance contract: n/A    Review of Systems  Constitutional: Negative for diaphoresis.  Eyes: Negative for pain.  Respiratory: Negative for shortness of breath.   Cardiovascular: Negative for chest pain, palpitations and leg swelling.  Gastrointestinal: Negative for abdominal pain.  Endocrine: Negative for polydipsia.  Skin: Negative for rash.  Neurological: Negative for dizziness, weakness and headaches.  Hematological: Does not bruise/bleed easily.  All other systems reviewed and are negative.       Objective:   Physical Exam Vitals and nursing note reviewed.  Constitutional:      General: She is not in acute distress.    Appearance: Normal appearance. She is well-developed.  HENT:     Head: Normocephalic.     Nose: Nose normal.  Eyes:     Pupils: Pupils are equal, round, and reactive to light.  Neck:     Vascular: No carotid bruit or JVD.  Cardiovascular:     Rate and Rhythm: Normal rate and regular rhythm.     Heart sounds: Normal heart sounds.  Pulmonary:     Effort: Pulmonary effort is normal. No respiratory distress.     Breath sounds: Normal breath sounds. No wheezing or rales.  Chest:     Chest wall: No tenderness.  Abdominal:     General: Bowel sounds are normal. There is no distension or abdominal bruit.     Palpations: Abdomen is soft. There is no hepatomegaly, splenomegaly, mass or pulsatile mass.     Tenderness: There is no abdominal tenderness.  Musculoskeletal:        General: Normal range of motion.     Cervical back: Normal range of motion and neck supple.  Lymphadenopathy:     Cervical: No cervical adenopathy.  Skin:    General: Skin is warm and dry.  Neurological:     Mental Status: She is alert and oriented to person, place, and time.     Deep Tendon Reflexes: Reflexes are normal and symmetric.  Psychiatric:        Behavior: Behavior normal.        Thought Content: Thought content normal.        Judgment: Judgment normal.     BP 122/66   Pulse 67   Temp (!) 97.5 F (36.4 C) (Temporal)   Resp 20   Ht '5\' 4"'  (1.626 m)   Wt 139 lb (63 kg)   SpO2 99%   BMI 23.86 kg/m   hgb1c 6.2      Assessment & Plan:  Lindsay Villarreal comes in today with chief complaint of Medical Management of Chronic Issues   Diagnosis and orders addressed:  1. Essential hypertension Low sodium diet - CMP14+EGFR  2. Mixed hyperlipidemia Low fat diet - Lipid panel  3. Type 2 diabetes mellitus with complication, without long-term current use of insulin  (HCC) Continue to wathch carbs in diet - hgba1c - Microalbumin / creatinine urine ratio  4. Diabetic autonomic neuropathy associated with type 2 diabetes mellitus ([place]) Do not go barefooted  5. Age-related osteoporosis without current pathological fracture Weight bearing exercises encouraged  6. Gastroesophageal reflux disease without esophagitis Avoid spicy foods Do not eat 2 hours prior to bedtime  7. Iron deficiency anemia, unspecified iron deficiency anemia type Labs pending    Labs pending Health Maintenance reviewed Diet and exercise encouraged  Follow up plan: 3 months   Mary-Margaret Hassell Done, FNP

## 2019-12-03 LAB — CMP14+EGFR
ALT: 11 IU/L (ref 0–32)
AST: 11 IU/L (ref 0–40)
Albumin/Globulin Ratio: 2.8 — ABNORMAL HIGH (ref 1.2–2.2)
Albumin: 4.4 g/dL (ref 3.7–4.7)
Alkaline Phosphatase: 53 IU/L (ref 39–117)
BUN/Creatinine Ratio: 22 (ref 12–28)
BUN: 13 mg/dL (ref 8–27)
Bilirubin Total: 0.3 mg/dL (ref 0.0–1.2)
CO2: 23 mmol/L (ref 20–29)
Calcium: 9.1 mg/dL (ref 8.7–10.3)
Chloride: 102 mmol/L (ref 96–106)
Creatinine, Ser: 0.6 mg/dL (ref 0.57–1.00)
GFR calc Af Amer: 105 mL/min/{1.73_m2} (ref >59)
GFR calc non Af Amer: 91 mL/min/{1.73_m2} (ref >59)
Globulin, Total: 1.6 g/dL (ref 1.5–4.5)
Glucose: 124 mg/dL — ABNORMAL HIGH (ref 65–99)
Potassium: 3.9 mmol/L (ref 3.5–5.2)
Sodium: 139 mmol/L (ref 134–144)
Total Protein: 6 g/dL (ref 6.0–8.5)

## 2019-12-03 LAB — LIPID PANEL
Chol/HDL Ratio: 2.6 ratio (ref 0.0–4.4)
Cholesterol, Total: 115 mg/dL (ref 100–199)
HDL: 45 mg/dL (ref >39)
LDL Chol Calc (NIH): 52 mg/dL (ref 0–99)
Triglycerides: 94 mg/dL (ref 0–149)
VLDL Cholesterol Cal: 18 mg/dL (ref 5–40)

## 2019-12-03 LAB — MICROALBUMIN / CREATININE URINE RATIO
Creatinine, Urine: 104.6 mg/dL
Microalb/Creat Ratio: 8 mg/g creat (ref 0–29)
Microalbumin, Urine: 8.4 ug/mL

## 2020-02-01 LAB — HM DIABETES EYE EXAM

## 2020-02-20 ENCOUNTER — Other Ambulatory Visit: Payer: Self-pay | Admitting: Nurse Practitioner

## 2020-02-20 DIAGNOSIS — E1143 Type 2 diabetes mellitus with diabetic autonomic (poly)neuropathy: Secondary | ICD-10-CM

## 2020-03-02 ENCOUNTER — Encounter: Payer: Self-pay | Admitting: Nurse Practitioner

## 2020-03-02 ENCOUNTER — Ambulatory Visit (INDEPENDENT_AMBULATORY_CARE_PROVIDER_SITE_OTHER): Payer: Medicare HMO | Admitting: Nurse Practitioner

## 2020-03-02 ENCOUNTER — Other Ambulatory Visit: Payer: Self-pay

## 2020-03-02 VITALS — BP 122/63 | HR 63 | Temp 97.6°F | Resp 20 | Ht 63.75 in | Wt 140.0 lb

## 2020-03-02 DIAGNOSIS — E782 Mixed hyperlipidemia: Secondary | ICD-10-CM | POA: Diagnosis not present

## 2020-03-02 DIAGNOSIS — E1143 Type 2 diabetes mellitus with diabetic autonomic (poly)neuropathy: Secondary | ICD-10-CM

## 2020-03-02 DIAGNOSIS — K219 Gastro-esophageal reflux disease without esophagitis: Secondary | ICD-10-CM | POA: Diagnosis not present

## 2020-03-02 DIAGNOSIS — I10 Essential (primary) hypertension: Secondary | ICD-10-CM

## 2020-03-02 DIAGNOSIS — D509 Iron deficiency anemia, unspecified: Secondary | ICD-10-CM

## 2020-03-02 DIAGNOSIS — E118 Type 2 diabetes mellitus with unspecified complications: Secondary | ICD-10-CM

## 2020-03-02 DIAGNOSIS — M810 Age-related osteoporosis without current pathological fracture: Secondary | ICD-10-CM

## 2020-03-02 LAB — BAYER DCA HB A1C WAIVED: HB A1C (BAYER DCA - WAIVED): 6.5 % (ref <7.0)

## 2020-03-02 NOTE — Patient Instructions (Signed)

## 2020-03-02 NOTE — Progress Notes (Signed)
Subjective:    Patient ID: Lindsay Villarreal, female    DOB: Sep 22, 1946, 74 y.o.   MRN: [id number]   Chief Complaint: Medical Management of Chronic Issues   HPI:  1. Essential hypertension No c/o chest pain, sob or headache. Does not check blood pressure at home. BP Readings from Last 3 Encounters:  12/02/19 122/66  08/23/19 118/64  08/17/19 128/71     2. Mixed hyperlipidemia Does not watch diet and does very little exercise.  Lab Results  Component Value Date   CHOL 115 12/02/2019   HDL 45 12/02/2019   LDLCALC 52 12/02/2019   TRIG 94 12/02/2019   CHOLHDL 2.6 12/02/2019     3. Diabetic autonomic neuropathy associated with type 2 diabetes mellitus ([place]) Has occasional burning of bil feet. She does have 3 toes on left foot will go numb.she takes neurontin 12m 1x daily.  4. Gastroesophageal reflux disease without esophagitis Not having any reflux issues on a regular basis. Will take something OTC if needed.  5. Type 2 diabetes mellitus with complication, without long-term current use of insulin (HCC) Fasting blood sugars are running 106-130. Denies any low blood sugars. Lab Results  Component Value Date   HGBA1C 6.2 12/02/2019     6. Age-related osteoporosis without current pathological fracture Last dexascan was done in January of 2021. Report states stable from last scan.  7. Iron deficiency anemia, unspecified iron deficiency anemia type No c/o fatigue. Takes daily iron supplement Lab Results  Component Value Date   HGB 13.2 01/28/2019        Outpatient Encounter Medications as of 03/02/2020  Medication Sig  . aspirin 81 MG tablet Take 81 mg by mouth daily.  .Marland Kitchenb complex vitamins capsule Take 1 capsule by mouth daily.  . Biotin 10 MG TABS Take 4 tablets by mouth daily.  . calcium carbonate (OS-CAL) 600 MG TABS tablet Take 600 mg by mouth daily with breakfast.  . denosumab (PROLIA) 60 MG/ML SOLN injection Inject 60 mg into the skin every 6 (six) months.  Administer in upper arm, thigh, or abdomen  . Ferrous Sulfate (IRON) 325 (65 Fe) MG TABS Take 1 tablet (325 mg total) by mouth daily.  .Marland Kitchengabapentin (NEURONTIN) 100 MG capsule TAKE 1 CAPSULE BY MOUTH THREE TIMES DAILY  . glucose blood (ONE TOUCH ULTRA TEST) test strip USE TO CHECK GLUCOSE ONCE DAILY   E11.9  . lisinopril (ZESTRIL) 5 MG tablet Take 1 tablet (5 mg total) by mouth daily.  . metFORMIN (GLUCOPHAGE) 500 MG tablet Take 1 tablet (500 mg total) by mouth 2 (two) times daily.  .Glory RosebushDelica Lancets 362XMISC USE ONE  TO CHECK GLUCOSE ONCE DAILY Dx E11.40  . simvastatin (ZOCOR) 20 MG tablet Take 1 tablet (20 mg total) by mouth daily.     Past Surgical History:  Procedure Laterality Date  . APPENDECTOMY  1978  . BLADDER REPAIR    . CATARACT EXTRACTION W/ INTRAOCULAR LENS IMPLANT  2010 (left), 2013 (right)  . COLONOSCOPY    . EYE SURGERY    . POLYPECTOMY    . TUBAL LIGATION    . VAGINAL HYSTERECTOMY  1978    Family History  Problem Relation Age of Onset  . Stomach cancer Father   . Pancreatic cancer Father   . Liver cancer Father   . Other Sister 614      Cancer of Bile Duct  . Breast cancer Sister   . Diabetes Sister   .  Brain cancer Sister 64  . Heart attack Mother   . Heart failure Mother   . Sarcoidosis Sister   . Colon cancer Neg Hx   . Esophageal cancer Neg Hx   . Rectal cancer Neg Hx     New complaints: None today  Social history: Lives by herself. Family checks on her daily  Controlled substance contract: n/a    Review of Systems  Constitutional: Negative for diaphoresis.  Eyes: Negative for pain.  Respiratory: Negative for shortness of breath.   Cardiovascular: Negative for chest pain, palpitations and leg swelling.  Gastrointestinal: Negative for abdominal pain.  Endocrine: Negative for polydipsia.  Skin: Negative for rash.  Neurological: Negative for dizziness, weakness and headaches.  Hematological: Does not bruise/bleed easily.  All other  systems reviewed and are negative.      Objective:   Physical Exam Vitals and nursing note reviewed.  Constitutional:      General: She is not in acute distress.    Appearance: Normal appearance. She is well-developed.  HENT:     Head: Normocephalic.     Nose: Nose normal.  Eyes:     Pupils: Pupils are equal, round, and reactive to light.  Neck:     Vascular: No carotid bruit or JVD.  Cardiovascular:     Rate and Rhythm: Normal rate and regular rhythm.     Heart sounds: Murmur (2/6 systolic) present.  Pulmonary:     Effort: Pulmonary effort is normal. No respiratory distress.     Breath sounds: Normal breath sounds. No wheezing or rales.  Chest:     Chest wall: No tenderness.  Abdominal:     General: Bowel sounds are normal. There is no distension or abdominal bruit.     Palpations: Abdomen is soft. There is no hepatomegaly, splenomegaly, mass or pulsatile mass.     Tenderness: There is no abdominal tenderness.  Musculoskeletal:        General: Normal range of motion.     Cervical back: Normal range of motion and neck supple.  Lymphadenopathy:     Cervical: No cervical adenopathy.  Skin:    General: Skin is warm and dry.  Neurological:     Mental Status: She is alert and oriented to person, place, and time.     Deep Tendon Reflexes: Reflexes are normal and symmetric.  Psychiatric:        Behavior: Behavior normal.        Thought Content: Thought content normal.        Judgment: Judgment normal.    BP 122/63   Pulse 63   Temp 97.6 F (36.4 C) (Temporal)   Resp 20   Ht 5' 3.75" (1.619 m)   Wt 140 lb (63.5 kg)   SpO2 98%   BMI 24.22 kg/m   hgba1c 6.5% today     Assessment & Plan:  Lindsay Villarreal comes in today with chief complaint of Medical Management of Chronic Issues   Diagnosis and orders addressed:  1. Essential hypertension Low sodium diet - CBC with Differential/Platelet - CMP14+EGFR  2. Mixed hyperlipidemia Low fat diet - Lipid panel  3.  Diabetic autonomic neuropathy associated with type 2 diabetes mellitus ([place]) Do not go barefooted Check feet daily  4. Gastroesophageal reflux disease without esophagitis Avoid spicy foods Do not eat 2 hours prior to bedtime  5. Type 2 diabetes mellitus with complication, without long-term current use of insulin (HCC) Continue to watch carbs in diet - Bayer DCA Hb  A1c Waived  6. Age-related osteoporosis without current pathological fracture Weight bearing exercises encouraged daily vitamin D and calcium supplement  7. Iron deficiency anemia, unspecified iron deficiency anemia type Continue daily iron supplement   Labs pending Health Maintenance reviewed Diet and exercise encouraged  Follow up plan: 3 months   Mary-Margaret Hassell Done, FNP

## 2020-03-03 LAB — CMP14+EGFR
ALT: 13 IU/L (ref 0–32)
AST: 14 IU/L (ref 0–40)
Albumin/Globulin Ratio: 2.2 (ref 1.2–2.2)
Albumin: 4.4 g/dL (ref 3.7–4.7)
Alkaline Phosphatase: 62 IU/L (ref 39–117)
BUN/Creatinine Ratio: 19 (ref 12–28)
BUN: 13 mg/dL (ref 8–27)
Bilirubin Total: 0.3 mg/dL (ref 0.0–1.2)
CO2: 25 mmol/L (ref 20–29)
Calcium: 9.7 mg/dL (ref 8.7–10.3)
Chloride: 102 mmol/L (ref 96–106)
Creatinine, Ser: 0.69 mg/dL (ref 0.57–1.00)
GFR calc Af Amer: 99 mL/min/{1.73_m2} (ref >59)
GFR calc non Af Amer: 86 mL/min/{1.73_m2} (ref >59)
Globulin, Total: 2 g/dL (ref 1.5–4.5)
Glucose: 126 mg/dL — ABNORMAL HIGH (ref 65–99)
Potassium: 4.1 mmol/L (ref 3.5–5.2)
Sodium: 142 mmol/L (ref 134–144)
Total Protein: 6.4 g/dL (ref 6.0–8.5)

## 2020-03-03 LAB — CBC WITH DIFFERENTIAL/PLATELET
Basophils Absolute: 0 10*3/uL (ref 0.0–0.2)
Basos: 1 %
EOS (ABSOLUTE): 0.1 10*3/uL (ref 0.0–0.4)
Eos: 2 %
Hematocrit: 39.8 % (ref 34.0–46.6)
Hemoglobin: 13.4 g/dL (ref 11.1–15.9)
Immature Grans (Abs): 0 10*3/uL (ref 0.0–0.1)
Immature Granulocytes: 0 %
Lymphocytes Absolute: 2.2 10*3/uL (ref 0.7–3.1)
Lymphs: 31 %
MCH: 29.6 pg (ref 26.6–33.0)
MCHC: 33.7 g/dL (ref 31.5–35.7)
MCV: 88 fL (ref 79–97)
Monocytes Absolute: 0.6 10*3/uL (ref 0.1–0.9)
Monocytes: 8 %
Neutrophils Absolute: 4.2 10*3/uL (ref 1.4–7.0)
Neutrophils: 58 %
Platelets: 321 10*3/uL (ref 150–450)
RBC: 4.52 x10E6/uL (ref 3.77–5.28)
RDW: 12.4 % (ref 11.7–15.4)
WBC: 7.1 10*3/uL (ref 3.4–10.8)

## 2020-03-03 LAB — LIPID PANEL
Chol/HDL Ratio: 2.8 ratio (ref 0.0–4.4)
Cholesterol, Total: 119 mg/dL (ref 100–199)
HDL: 43 mg/dL (ref >39)
LDL Chol Calc (NIH): 50 mg/dL (ref 0–99)
Triglycerides: 149 mg/dL (ref 0–149)
VLDL Cholesterol Cal: 26 mg/dL (ref 5–40)

## 2020-03-06 ENCOUNTER — Encounter: Payer: Self-pay | Admitting: Nurse Practitioner

## 2020-03-08 ENCOUNTER — Telehealth: Payer: Self-pay | Admitting: Nurse Practitioner

## 2020-03-09 NOTE — Telephone Encounter (Signed)
Appointment scheduled 03/16/2020 at 8:30 am, patient aware.

## 2020-03-16 ENCOUNTER — Other Ambulatory Visit: Payer: Self-pay

## 2020-03-16 ENCOUNTER — Ambulatory Visit (INDEPENDENT_AMBULATORY_CARE_PROVIDER_SITE_OTHER): Payer: Medicare HMO

## 2020-03-16 DIAGNOSIS — M810 Age-related osteoporosis without current pathological fracture: Secondary | ICD-10-CM

## 2020-03-16 MED ORDER — DENOSUMAB 60 MG/ML ~~LOC~~ SOSY
60.0000 mg | PREFILLED_SYRINGE | Freq: Once | SUBCUTANEOUS | Status: AC
  Administered 2020-03-16: 60 mg via SUBCUTANEOUS

## 2020-03-16 NOTE — Progress Notes (Signed)
Prolia injection given to right arm.  Patient tolerated well. [employer] 

## 2020-03-19 ENCOUNTER — Other Ambulatory Visit: Payer: Self-pay | Admitting: Nurse Practitioner

## 2020-03-19 DIAGNOSIS — E1143 Type 2 diabetes mellitus with diabetic autonomic (poly)neuropathy: Secondary | ICD-10-CM

## 2020-06-18 ENCOUNTER — Ambulatory Visit (INDEPENDENT_AMBULATORY_CARE_PROVIDER_SITE_OTHER): Payer: Medicare HMO | Admitting: Nurse Practitioner

## 2020-06-18 ENCOUNTER — Encounter: Payer: Self-pay | Admitting: Nurse Practitioner

## 2020-06-18 ENCOUNTER — Other Ambulatory Visit: Payer: Self-pay

## 2020-06-18 VITALS — BP 129/70 | HR 69 | Temp 97.0°F | Resp 20 | Ht 63.0 in | Wt 136.0 lb

## 2020-06-18 DIAGNOSIS — I10 Essential (primary) hypertension: Secondary | ICD-10-CM

## 2020-06-18 DIAGNOSIS — K219 Gastro-esophageal reflux disease without esophagitis: Secondary | ICD-10-CM

## 2020-06-18 DIAGNOSIS — E1143 Type 2 diabetes mellitus with diabetic autonomic (poly)neuropathy: Secondary | ICD-10-CM

## 2020-06-18 DIAGNOSIS — E118 Type 2 diabetes mellitus with unspecified complications: Secondary | ICD-10-CM

## 2020-06-18 DIAGNOSIS — D509 Iron deficiency anemia, unspecified: Secondary | ICD-10-CM

## 2020-06-18 DIAGNOSIS — K4020 Bilateral inguinal hernia, without obstruction or gangrene, not specified as recurrent: Secondary | ICD-10-CM

## 2020-06-18 DIAGNOSIS — M810 Age-related osteoporosis without current pathological fracture: Secondary | ICD-10-CM

## 2020-06-18 DIAGNOSIS — E782 Mixed hyperlipidemia: Secondary | ICD-10-CM

## 2020-06-18 LAB — BAYER DCA HB A1C WAIVED: HB A1C (BAYER DCA - WAIVED): 6.4 % (ref <7.0)

## 2020-06-18 MED ORDER — SIMVASTATIN 20 MG PO TABS: 20.0000 mg | ORAL_TABLET | Freq: Every day | ORAL | 1 refills | Status: DC

## 2020-06-18 MED ORDER — LISINOPRIL 5 MG PO TABS: 5.0000 mg | ORAL_TABLET | Freq: Every day | ORAL | 1 refills | Status: DC

## 2020-06-18 MED ORDER — METFORMIN HCL 500 MG PO TABS: 500.0000 mg | ORAL_TABLET | Freq: Two times a day (BID) | ORAL | 1 refills | Status: DC

## 2020-06-18 MED ORDER — IRON 325 (65 FE) MG PO TABS: 1.0000 | ORAL_TABLET | Freq: Every day | ORAL | 1 refills | Status: DC

## 2020-06-18 MED ORDER — GABAPENTIN 100 MG PO CAPS: 100.0000 mg | ORAL_CAPSULE | Freq: Three times a day (TID) | ORAL | 1 refills | Status: DC

## 2020-06-18 NOTE — Progress Notes (Signed)
Subjective:    Patient ID: Lindsay Villarreal, female    DOB: [birth year], 74 y.o.   MRN: [id number]   Chief Complaint: Medical Management of Chronic Issues (Knots on abdominal area)    HPI:  1. Essential hypertension No c/o chest pain, sob or headache. Does not check blood pressure at home. BP Readings from Last 3 Encounters:  06/18/20 (!) 129/70  03/02/20 122/63  12/02/19 122/66     2. Mixed hyperlipidemia Does watch diet and walks some everyday. Lab Results  Component Value Date   CHOL 119 03/02/2020   HDL 43 03/02/2020   LDLCALC 50 03/02/2020   TRIG 149 03/02/2020   CHOLHDL 2.8 03/02/2020     3. Gastroesophageal reflux disease without esophagitis Is on no meds . Will use OTC meds when needed  4. Type 2 diabetes mellitus with complication, without long-term current use of insulin (HCC) Fasting blood sugars are running around110-140. denis any low blood sugars. Lab Results  Component Value Date   HGBA1C 6.5 03/02/2020     5. Diabetic autonomic neuropathy associated with type 2 diabetes mellitus (HCC) Has burning in feet when she is on them a lot. denies any sore or lesion on her feet  6. Iron deficiency anemia, unspecified iron deficiency anemia type No c/o fatique. Lab Results  Component Value Date   HGB 13.4 03/02/2020     7. Age-related osteoporosis without current pathological fracture Last dexascan was done on 12/02/19. T score -2.1. I son calcium and vitamind supplement.    Outpatient Encounter Medications as of 06/18/2020  Medication Sig  . aspirin 81 MG tablet Take 81 mg by mouth daily.  [name] b complex vitamins capsule Take 1 capsule by mouth daily.  . Biotin 10 MG TABS Take 4 tablets by mouth daily.  . calcium carbonate (OS-CAL) 600 MG TABS tablet Take 600 mg by mouth daily with breakfast.  . denosumab (PROLIA) 60 MG/ML SOLN injection Inject 60 mg into the skin every 6 (six) months. Administer in upper arm, thigh, or abdomen  . Ferrous Sulfate (IRON)  325 (65 Fe) MG TABS Take 1 tablet (325 mg total) by mouth daily.  [name] gabapentin (NEURONTIN) 100 MG capsule TAKE 1 CAPSULE BY MOUTH THREE TIMES DAILY  . glucose blood (ONE TOUCH ULTRA TEST) test strip USE TO CHECK GLUCOSE ONCE DAILY   E11.9  . lisinopril (ZESTRIL) 5 MG tablet Take 1 tablet (5 mg total) by mouth daily.  . metFORMIN (GLUCOPHAGE) 500 MG tablet Take 1 tablet (500 mg total) by mouth 2 (two) times daily.  Lindsay Villarreal Lancets 76H MISC USE ONE  TO CHECK GLUCOSE ONCE DAILY Dx E11.40  . simvastatin (ZOCOR) 20 MG tablet Take 1 tablet (20 mg total) by mouth daily.     Past Surgical History:  Procedure Laterality Date  . APPENDECTOMY  1978  . BLADDER REPAIR    . CATARACT EXTRACTION W/ INTRAOCULAR LENS IMPLANT  2010 (left), 2013 (right)  . COLONOSCOPY    . EYE SURGERY    . POLYPECTOMY    . TUBAL LIGATION    . VAGINAL HYSTERECTOMY  1978    Family History  Problem Relation Age of Onset  . Stomach cancer Father   . Pancreatic cancer Father   . Liver cancer Father   . Other Sister 96       Cancer of Bile Duct  . Breast cancer Sister   . Diabetes Sister   . Brain cancer Sister 24  . Heart attack  Mother   . Heart failure Mother   . Sarcoidosis Sister   . Colon cancer Neg Hx   . Esophageal cancer Neg Hx   . Rectal cancer Neg Hx     New complaints: Knots in bil groin area with right side bigger then left. When she lays down they go away.  Social history: Lives by herself- family check son her daily  Controlled substance contract: n/a    Review of Systems  Constitutional: Negative for diaphoresis.  Eyes: Negative for pain.  Respiratory: Negative for shortness of breath.   Cardiovascular: Negative for chest pain, palpitations and leg swelling.  Gastrointestinal: Negative for abdominal pain.  Endocrine: Negative for polydipsia.  Skin: Negative for rash.  Neurological: Negative for dizziness, weakness and headaches.  Hematological: Does not bruise/bleed easily.   All other systems reviewed and are negative.      Objective:   Physical Exam Vitals and nursing note reviewed.  Constitutional:      General: She is not in acute distress.    Appearance: Normal appearance. She is well-developed.  HENT:     Head: Normocephalic.     Nose: Nose normal.  Eyes:     Pupils: Pupils are equal, round, and reactive to light.  Neck:     Vascular: No carotid bruit or JVD.  Cardiovascular:     Rate and Rhythm: Normal rate and regular rhythm.     Heart sounds: Normal heart sounds.  Pulmonary:     Effort: Pulmonary effort is normal. No respiratory distress.     Breath sounds: Normal breath sounds. No wheezing or rales.  Chest:     Chest wall: No tenderness.  Abdominal:     General: Bowel sounds are normal. There is no distension or abdominal bruit.     Palpations: Abdomen is soft. There is no hepatomegaly, splenomegaly, mass or pulsatile mass.     Tenderness: There is no abdominal tenderness.  Musculoskeletal:        General: Normal range of motion.     Cervical back: Normal range of motion and neck supple.  Lymphadenopathy:     Cervical: No cervical adenopathy.     Comments: No lymphnodes palpabel in bil groin area when lying down.  Palpable inguninal hernias bil with standing  Skin:    General: Skin is warm and dry.  Neurological:     Mental Status: She is alert and oriented to person, place, and time.     Deep Tendon Reflexes: Reflexes are normal and symmetric.  Psychiatric:        Behavior: Behavior normal.        Thought Content: Thought content normal.        Judgment: Judgment normal.    BP (!) 129/70   Pulse 69   Temp (!) 97 F (36.1 C) (Temporal)   Resp 20   Ht '5\' 3"'  (1.6 m)   Wt 136 lb (61.7 kg)   SpO2 99%   BMI 24.09 kg/m    hgba1c 6.4%     Assessment & Plan:  Lindsay Villarreal comes in today with chief complaint of Medical Management of Chronic Issues (Knots on abdominal area)   Diagnosis and orders addressed:  1.  Essential hypertension Low sodium diet - CBC with Differential/Platelet - CMP14+EGFR - lisinopril (ZESTRIL) 5 MG tablet; Take 1 tablet (5 mg total) by mouth daily.  Dispense: 90 tablet; Refill: 1  2. Mixed hyperlipidemia Low fat diet - Lipid panel - simvastatin (ZOCOR) 20 MG tablet;  Take 1 tablet (20 mg total) by mouth daily.  Dispense: 90 tablet; Refill: 1  3. Gastroesophageal reflux disease without esophagitis Avoid spicy foods Do not eat 2 hours prior to bedtime  4. Type 2 diabetes mellitus with complication, without long-term current use of insulin (HCC) Continue to watch carbs in diet - Bayer DCA Hb A1c Waived - metFORMIN (GLUCOPHAGE) 500 MG tablet; Take 1 tablet (500 mg total) by mouth 2 (two) times daily.  Dispense: 180 tablet; Refill: 1  5. Diabetic autonomic neuropathy associated with type 2 diabetes mellitus ([place]) Do not go barefooted - gabapentin (NEURONTIN) 100 MG capsule; Take 1 capsule (100 mg total) by mouth 3 (three) times daily.  Dispense: 270 capsule; Refill: 1  6. Iron deficiency anemia, unspecified iron deficiency anemia type - Ferrous Sulfate (IRON) 325 (65 Fe) MG TABS; Take 1 tablet (325 mg total) by mouth daily.  Dispense: 90 tablet; Refill: 1  7. Age-related osteoporosis without current pathological fracture Weight bearing exercise encouraged  8. bil inguinal hernias Ref to general surgeon   Labs pending Health Maintenance reviewed- patient will make her own appointment with GI for colonoscopy. Diet and exercise encouraged  Follow up plan: 6  months   Mary-Margaret Hassell Done, FNP

## 2020-06-19 LAB — CMP14+EGFR
ALT: 10 IU/L (ref 0–32)
AST: 10 IU/L (ref 0–40)
Albumin/Globulin Ratio: 2.3 — ABNORMAL HIGH (ref 1.2–2.2)
Albumin: 4.2 g/dL (ref 3.7–4.7)
Alkaline Phosphatase: 49 IU/L (ref 48–121)
BUN/Creatinine Ratio: 24 (ref 12–28)
BUN: 13 mg/dL (ref 8–27)
Bilirubin Total: 0.3 mg/dL (ref 0.0–1.2)
CO2: 25 mmol/L (ref 20–29)
Calcium: 8.7 mg/dL (ref 8.7–10.3)
Chloride: 105 mmol/L (ref 96–106)
Creatinine, Ser: 0.54 mg/dL — ABNORMAL LOW (ref 0.57–1.00)
GFR calc Af Amer: 108 mL/min/{1.73_m2} (ref >59)
GFR calc non Af Amer: 93 mL/min/{1.73_m2} (ref >59)
Globulin, Total: 1.8 g/dL (ref 1.5–4.5)
Glucose: 118 mg/dL — ABNORMAL HIGH (ref 65–99)
Potassium: 4.6 mmol/L (ref 3.5–5.2)
Sodium: 143 mmol/L (ref 134–144)
Total Protein: 6 g/dL (ref 6.0–8.5)

## 2020-06-19 LAB — LIPID PANEL
Chol/HDL Ratio: 2.7 ratio (ref 0.0–4.4)
Cholesterol, Total: 111 mg/dL (ref 100–199)
HDL: 41 mg/dL (ref >39)
LDL Chol Calc (NIH): 50 mg/dL (ref 0–99)
Triglycerides: 111 mg/dL (ref 0–149)
VLDL Cholesterol Cal: 20 mg/dL (ref 5–40)

## 2020-06-19 LAB — CBC WITH DIFFERENTIAL/PLATELET
Basophils Absolute: 0.1 10*3/uL (ref 0.0–0.2)
Basos: 1 %
EOS (ABSOLUTE): 0.1 10*3/uL (ref 0.0–0.4)
Eos: 1 %
Hematocrit: 39.5 % (ref 34.0–46.6)
Hemoglobin: 12.8 g/dL (ref 11.1–15.9)
Immature Grans (Abs): 0 10*3/uL (ref 0.0–0.1)
Immature Granulocytes: 0 %
Lymphocytes Absolute: 3.1 10*3/uL (ref 0.7–3.1)
Lymphs: 34 %
MCH: 29.1 pg (ref 26.6–33.0)
MCHC: 32.4 g/dL (ref 31.5–35.7)
MCV: 90 fL (ref 79–97)
Monocytes Absolute: 0.7 10*3/uL (ref 0.1–0.9)
Monocytes: 7 %
Neutrophils Absolute: 5.2 10*3/uL (ref 1.4–7.0)
Neutrophils: 57 %
Platelets: 305 10*3/uL (ref 150–450)
RBC: 4.4 x10E6/uL (ref 3.77–5.28)
RDW: 12.3 % (ref 11.7–15.4)
WBC: 9.1 10*3/uL (ref 3.4–10.8)

## 2020-06-25 ENCOUNTER — Other Ambulatory Visit: Payer: Self-pay | Admitting: Nurse Practitioner

## 2020-06-25 DIAGNOSIS — I10 Essential (primary) hypertension: Secondary | ICD-10-CM

## 2020-06-25 DIAGNOSIS — E782 Mixed hyperlipidemia: Secondary | ICD-10-CM

## 2020-06-25 DIAGNOSIS — E118 Type 2 diabetes mellitus with unspecified complications: Secondary | ICD-10-CM

## 2020-07-12 ENCOUNTER — Other Ambulatory Visit: Payer: Self-pay

## 2020-07-12 ENCOUNTER — Encounter: Payer: Self-pay | Admitting: General Surgery

## 2020-07-12 ENCOUNTER — Ambulatory Visit (INDEPENDENT_AMBULATORY_CARE_PROVIDER_SITE_OTHER): Payer: Medicare HMO | Admitting: General Surgery

## 2020-07-12 VITALS — BP 115/62 | HR 68 | Temp 97.7°F | Resp 12 | Ht 63.0 in | Wt 135.0 lb

## 2020-07-12 DIAGNOSIS — R011 Cardiac murmur, unspecified: Secondary | ICD-10-CM

## 2020-07-12 DIAGNOSIS — K4090 Unilateral inguinal hernia, without obstruction or gangrene, not specified as recurrent: Secondary | ICD-10-CM | POA: Diagnosis not present

## 2020-07-12 NOTE — Patient Instructions (Signed)
Referral to Cardiology for murmur and cardiac risk stratification for surgery.   Inguinal Hernia, Adult An inguinal hernia is when fat or your intestines push through a weak spot in a muscle where your leg meets your lower belly (groin). This causes a rounded lump (bulge). This kind of hernia could also be:  In your scrotum, if you are female.  In folds of skin around your vagina, if you are female. There are three types of inguinal hernias. These include:  Hernias that can be pushed back into the belly (are reducible). This type rarely causes pain.  Hernias that cannot be pushed back into the belly (are incarcerated).  Hernias that cannot be pushed back into the belly and lose their blood supply (are strangulated). This type needs emergency surgery. If you do not have symptoms, you may not need treatment. If you have symptoms or a large hernia, you may need surgery. Follow these instructions at home: Lifestyle  Do these things if told by your doctor so you do not have trouble pooping (constipation): ? Drink enough fluid to keep your pee (urine) pale yellow. ? Eat foods that have a lot of fiber. These include fresh fruits and vegetables, whole grains, and beans. ? Limit foods that are high in fat and processed sugars. These include foods that are fried or sweet. ? Take medicine for trouble pooping.  Avoid lifting heavy objects.  Avoid standing for long amounts of time.  Do not use any products that contain nicotine or tobacco. These include cigarettes and e-cigarettes. If you need help quitting, ask your doctor.  Stay at a healthy weight. General instructions  You may try to push your hernia in by very gently pressing on it when you are lying down. Do not try to force the bulge back in if it will not push in easily.  Watch your hernia for any changes in shape, size, or color. Tell your doctor if you see any changes.  Take over-the-counter and prescription medicines only as told by  your doctor.  Keep all follow-up visits as told by your doctor. This is important. Contact a doctor if:  You have a fever.  You have new symptoms.  Your symptoms get worse. Get help right away if:  The area where your leg meets your lower belly has: ? Pain that gets worse suddenly. ? A bulge that gets bigger suddenly, and it does not get smaller after that. ? A bulge that turns red or purple. ? A bulge that is painful when you touch it.  You are a man, and your scrotum: ? Suddenly feels painful. ? Suddenly changes in size.  You cannot push the hernia in by very gently pressing on it when you are lying down. Do not try to force the bulge back in if it will not push in easily.  You feel sick to your stomach (nauseous), and that feeling does not go away.  You throw up (vomit), and that keeps happening.  You have a fast heartbeat.  You cannot poop (have a bowel movement) or pass gas. These symptoms may be an emergency. Do not wait to see if the symptoms will go away. Get medical help right away. Call your local emergency services (911 in the [place] Summary  An inguinal hernia is when fat or your intestines push through a weak spot in a muscle where your leg meets your lower belly (groin). This causes a rounded lump (bulge).  If you do not have symptoms, you  may not need treatment. If you have symptoms or a large hernia, you may need surgery.  Avoid lifting heavy objects. Also avoid standing for long amounts of time.  Do not try to force the bulge back in if it will not push in easily. This information is not intended to replace advice given to you by your health care provider. Make sure you discuss any questions you have with your health care provider. Document Revised: 12/05/2017 Document Reviewed: 08/05/2017 Elsevier Patient Education  [employer] [place] Repair, Adult  Open hernia repair is a surgical procedure to fix a hernia. A hernia occurs when an  internal organ or tissue pushes out through a weak spot in the abdominal wall muscles. Hernias commonly occur in the groin and around the navel. Most hernias tend to get worse over time. Often, surgery is done to prevent the hernia from becoming bigger, uncomfortable, or an emergency. Emergency surgery may be needed if abdominal contents get stuck in the opening (incarcerated hernia) or the blood supply gets cut off (strangulated hernia). In an open repair, an incision is made in the abdomen to perform the surgery. Tell a health care provider about:  Any allergies you have.  All medicines you are taking, including vitamins, herbs, eye drops, creams, and over-the-counter medicines.  Any problems you or family members have had with anesthetic medicines.  Any blood or bone disorders you have.  Any surgeries you have had.  Any medical conditions you have, including any recent cold or flu symptoms.  Whether you are pregnant or may be pregnant. What are the risks? Generally, this is a safe procedure. However, problems may occur, including:  Long-lasting (chronic) pain.  Bleeding.  Infection.  Damage to the testicle. This can cause shrinking or swelling.  Damage to the bladder, blood vessels, intestine, or nerves near the hernia.  Trouble passing urine.  Allergic reactions to medicines.  Return of the hernia.  Medicines  Ask your health care provider about: ? Changing or stopping your regular medicines. This is especially important if you are taking diabetes medicines or blood thinners. ? Taking medicines such as aspirin and ibuprofen. These medicines can thin your blood. Do not take these medicines before your procedure if your health care provider instructs you not to.  You may be given antibiotic medicine to help prevent infection. General instructions  You may have blood tests or imaging studies.  Ask your health care provider how your surgical site will be marked or  identified.  If you smoke, do not smoke for at least 2 weeks before your procedure or for as long as told by your health care provider.  Let your health care provider know if you develop a cold or any infection before your surgery.  Plan to have someone take you home from the hospital or clinic.  If you will be going home right after the procedure, plan to have someone with you for 24 hours. What happens during the procedure?  To reduce your risk of infection: ? Your health care team will wash or sanitize their hands. ? Your skin will be washed with soap. ? Hair may be removed from the surgical area.  An IV tube will be inserted into one of your veins.  You will be given one or more of the following: ? A medicine to help you relax (sedative). ? A medicine to numb the area (local anesthetic). ? A medicine to make you fall asleep (general anesthetic).  Your surgeon will make an incision over the hernia.  The tissues of the hernia will be moved back into place.  The edges of the hernia may be stitched together.  The opening in the abdominal muscles will be closed with stitches (sutures). Or, your surgeon will place a mesh patch made of manmade (synthetic) material over the opening.  The incision will be closed.  A bandage (dressing) may be placed over the incision. The procedure may vary among health care providers and hospitals. What happens after the procedure?  Your blood pressure, heart rate, breathing rate, and blood oxygen level will be monitored until the medicines you were given have worn off.  You may be given medicine for pain.  Do not drive for 24 hours if you received a sedative. This information is not intended to replace advice given to you by your health care provider. Make sure you discuss any questions you have with your health care provider. Document Revised: 10/16/2017 Document Reviewed: 04/16/2016 Elsevier Patient Education  2020 [employer].

## 2020-07-12 NOTE — Progress Notes (Signed)
[facility] History and Physical  Reason for Referral: Bilateral inguinal hernia  Referring Physician:  [name], [place]  Chief Complaint    New Patient (Initial Visit)      Lindsay Villarreal is a 74 y.o. female.  HPI:  Lindsay Villarreal is a sweet 74 yo who comes in with reports of noticing bulges in her bilateral groins for months and having them stick out whenever she is standing. She says the right can cause her some discomfort when she is doing work or lifting and that the left has not done this. The discomfort is like a ache or pressure/pulling in the right groin region. She reports no issues with incarceration or having things stuck out and hard. She has no obstructive symptoms.  She has a history of a hysterectomy and tubal ligation a with appendectomy prior to the hysterectomy.   Past Medical History:  Diagnosis Date   Anemia    Arthritis    Cataract    bil cateracts removed   Diabetes mellitus without complication (HCC)    GERD (gastroesophageal reflux disease)    Heart murmur    Hyperlipidemia    Hypertension    Neuromuscular disorder ([place])    Osteoporosis     Past Surgical History:  Procedure Laterality Date   APPENDECTOMY  1978   BLADDER REPAIR     CATARACT EXTRACTION W/ INTRAOCULAR LENS IMPLANT  2010 (left), 2013 (right)   COLONOSCOPY     EYE SURGERY     POLYPECTOMY     TUBAL LIGATION     VAGINAL HYSTERECTOMY  1978    Family History  Problem Relation Age of Onset   Stomach cancer Father    Pancreatic cancer Father    Liver cancer Father    Other Sister 62       Cancer of Bile Duct   Breast cancer Sister    Diabetes Sister    Brain cancer Sister 51   Heart attack Mother    Heart failure Mother    Sarcoidosis Sister    Colon cancer Neg Hx    Esophageal cancer Neg Hx    Rectal cancer Neg Hx     Social History   Tobacco Use   Smoking status: Current Every Day Smoker    Packs/day: 0.50    Years:  50.00    Pack years: 25.00    Types: Cigarettes   Smokeless tobacco: Never Used  Vaping Use   Vaping Use: Never used  Substance Use Topics   Alcohol use: No   Drug use: No    Medications: I have reviewed the patient's current medications. Allergies as of 07/12/2020      Reactions   Codeine Other (See Comments)   Hallucinations.      Medication List       Accurate as of July 12, 2020  4:39 PM. If you have any questions, ask your nurse or doctor.        aspirin 81 MG tablet Take 81 mg by mouth daily.   b complex vitamins capsule Take 1 capsule by mouth daily.   Biotin 10 MG Tabs Take 4 tablets by mouth daily.   calcium carbonate 600 MG Tabs tablet Commonly known as: OS-CAL Take 600 mg by mouth daily with breakfast.   denosumab 60 MG/ML Soln injection Commonly known as: PROLIA Inject 60 mg into the skin every 6 (six) months. Administer in upper arm, thigh, or abdomen   gabapentin 100 MG capsule Commonly known  as: NEURONTIN Take 1 capsule (100 mg total) by mouth 3 (three) times daily.   glucose blood test strip Commonly known as: ONE TOUCH ULTRA TEST USE TO CHECK GLUCOSE ONCE DAILY   E11.9   Iron 325 (65 Fe) MG Tabs Take 1 tablet (325 mg total) by mouth daily.   lisinopril 5 MG tablet Commonly known as: ZESTRIL Take 1 tablet (5 mg total) by mouth daily.   metFORMIN 500 MG tablet Commonly known as: GLUCOPHAGE Take 1 tablet (500 mg total) by mouth 2 (two) times daily.   OneTouch Delica Lancets 97W Misc USE ONE  TO CHECK GLUCOSE ONCE DAILY Dx E11.40   simvastatin 20 MG tablet Commonly known as: ZOCOR Take 1 tablet (20 mg total) by mouth daily.        ROS:  A comprehensive review of systems was negative except for: Gastrointestinal: positive for right groin pain Genitourinary: positive for frequency and prior bladder tack Musculoskeletal: positive for back pain, neck pain and joint pain  Blood pressure 115/62, pulse 68, temperature 97.7 F  (36.5 C), temperature source Oral, resp. rate 12, height 5\' 3"  (1.6 m), weight 135 lb (61.2 kg), SpO2 94 %. Physical Exam Vitals reviewed.  HENT:     Head: Normocephalic and atraumatic.     Nose: Nose normal.  Eyes:     Extraocular Movements: Extraocular movements intact.     Pupils: Pupils are equal, round, and reactive to light.  Cardiovascular:     Rate and Rhythm: Normal rate.     Heart sounds: Murmur heard.  Systolic murmur is present.   Abdominal:     General: There is no distension.     Palpations: Abdomen is soft.     Tenderness: There is no abdominal tenderness.     Hernia: A hernia is present. Hernia is present in the left inguinal area and right inguinal area.     Comments: Reducible hernias bilaterally  Musculoskeletal:        General: No swelling. Normal range of motion.     Cervical back: Normal range of motion. No rigidity.  Skin:    General: Skin is warm and dry.  Neurological:     General: No focal deficit present.     Mental Status: She is alert and oriented to person, place, and time.  Psychiatric:        Mood and Affect: Mood normal.        Behavior: Behavior normal.        Thought Content: Thought content normal.        Judgment: Judgment normal.     Results: None   Assessment & Plan:  Lindsay Villarreal is a 74 y.o. female with bilateral inguinal hernias who has symptoms on the right. She also has a murmur on physical exam and this has been documented since about 2015. She says that she was told about the murmur but no additional work up was pursued at that time. She denies any chest pain, SOB, cardiac events. She does have a history of smoking, DM, HLD so she is at risk for CAD and valvular disease.   -Referral to cardiology to further evaluate murmur and determine if additional testing needed prior to surgery  -Will see in November to get her scheduled once COVID is hopefully not surging and to give cardiology time to see her   Discussed the risk and  benefits including, bleeding, infection, use of mesh, risk of recurrence, risk of nerve damage causing numbness or  changes in sensation, risk of damage to the cord structures. The patient understands the risk and benefits of repair with mesh, and has decided to proceed.  We also discussed open versus laparoscopic surgery and the use of mesh. We discussed that I do open repairs with mesh, and that this is considered equivalent to laparoscopic surgery. We discussed reasons for opting for laparoscopic surgery including if a bilateral repair is needed or if a patient has a recurrence after an open repair.  At this time she wants to pursue right sided repair and hold on the left. If her symptoms change between now and November we can refer her to a surgeon that does laparoscopic repair.   Future Appointments  Date Time Provider [place]  08/30/2020  8:30 AM [facility]ANNUAL WELLNESS VISIT [facility] None  10/09/2020  9:00 AM Virl Cagey, MD [facility] None  12/20/2020  8:00 AM [name], FNP [facility] None    All questions were answered to the satisfaction of the patient.    Virl Cagey 07/12/2020, 4:39 PM

## 2020-08-30 ENCOUNTER — Ambulatory Visit (INDEPENDENT_AMBULATORY_CARE_PROVIDER_SITE_OTHER): Payer: Medicare HMO | Admitting: Family

## 2020-08-30 DIAGNOSIS — Z0000 Encounter for general adult medical examination without abnormal findings: Secondary | ICD-10-CM | POA: Diagnosis not present

## 2020-08-30 NOTE — Progress Notes (Addendum)
MEDICARE ANNUAL WELLNESS VISIT  08/30/2020  Telephone Visit Disclaimer This Medicare AWV was conducted by telephone due to national recommendations for restrictions regarding the COVID-19 Pandemic (e.g. social distancing).  I verified, using two identifiers, that I am speaking with Lindsay Villarreal or their authorized healthcare agent. I discussed the limitations, risks, security, and privacy concerns of performing an evaluation and management service by telephone and the potential availability of an in-person appointment in the future. The patient expressed understanding and agreed to proceed.  Location of Patient: Home Location of Provider (nurse):  [facility]  Subjective:    Lindsay Villarreal is a 74 y.o. female patient of Lindsay Villarreal, [place] who had a Medicare Annual Wellness Visit today via telephone. Lindsay Villarreal lives locally in [place] and lives alone. She is divorced. She has 3 children all of which live close by and she stays in contact with them. 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 She enjoys reading in her free time. She doesn't have a regular exercise routine but remains very active. She states that her health is about the same as it was this time last year but she does feel like she is slower than she used to be. She is due for a colonoscopy. She received a letter about scheduling an appointment and will do so soon.   Patient Care Team: Lindsay Pretty, FNP as PCP - General (Nurse Practitioner) Okey Regal, [place] as Consulting Physician (Optometry) Beryle Beams Alyson Locket, MD as Consulting Physician (Hematology) Irene Shipper, MD as Consulting Physician (Gastroenterology)  Advanced Directives 08/30/2020 08/30/2019 03/09/2017 02/18/2016 04/18/2015 02/12/2015  Does Patient Have a Medical Advance Directive? No No No No No No  Would patient like information on creating a medical advance directive? No - Patient  declined No - Patient declined Yes (MAU/Ambulatory/Procedural Areas - Information given) Yes - Educational materials given No - patient declined information Yes - Educational materials given    Hospital Utilization Over the Past 12 Months: # of hospitalizations or ER visits: 0 # of surgeries: 0  Review of Systems    Patient reports that her overall health is unchanged compared to last year.  History obtained from chart review  Patient Reported Readings (BP, Pulse, CBG, Weight, etc) none  Pain Assessment Pain : 0-10 Pain Score: 5  Pain Type: Chronic pain Pain Location: Knee Pain Descriptors / Indicators: Aching Pain Onset: More than a month ago Pain Frequency: Intermittent     Current Medications & Allergies (verified) Allergies as of 08/30/2020       Reactions   Codeine Other (See Comments)   Hallucinations.        Medication List        Accurate as of August 30, 2020  8:58 AM. If you have any questions, ask your nurse or doctor.          aspirin 81 MG tablet Take 81 mg by mouth daily.   b complex vitamins capsule Take 1 capsule by mouth daily.   Biotin 10 MG Tabs Take 4 tablets by mouth daily.   calcium carbonate 600 MG Tabs tablet Commonly known as: OS-CAL Take 600 mg by mouth daily with breakfast.   denosumab 60 MG/ML Soln injection Commonly known as: PROLIA Inject 60 mg into the skin every 6 (six) months. Administer in upper arm, thigh, or abdomen   gabapentin 100 MG capsule Commonly known as: NEURONTIN Take 1 capsule (100 mg  total) by mouth 3 (three) times daily.   glucose blood test strip Commonly known as: ONE TOUCH ULTRA TEST USE TO CHECK GLUCOSE ONCE DAILY   E11.9   Iron 325 (65 Fe) MG Tabs Take 1 tablet (325 mg total) by mouth daily.   lisinopril 5 MG tablet Commonly known as: ZESTRIL Take 1 tablet (5 mg total) by mouth daily.   metFORMIN 500 MG tablet Commonly known as: GLUCOPHAGE Take 1 tablet (500 mg total) by mouth 2 (two)  times daily.   OneTouch Delica Lancets 06Y Misc USE ONE  TO CHECK GLUCOSE ONCE DAILY Dx E11.40   simvastatin 20 MG tablet Commonly known as: ZOCOR Take 1 tablet (20 mg total) by mouth daily.        History (reviewed): Past Medical History:  Diagnosis Date   Anemia    Arthritis    Cataract    bil cateracts removed   Diabetes mellitus without complication (HCC)    GERD (gastroesophageal reflux disease)    Heart murmur    Hyperlipidemia    Hypertension    Neuromuscular disorder ([place])    Osteoporosis    Past Surgical History:  Procedure Laterality Date   APPENDECTOMY  1978   BLADDER REPAIR     CATARACT EXTRACTION W/ INTRAOCULAR LENS IMPLANT  2010 (left), 2013 (right)   COLONOSCOPY     EYE SURGERY     POLYPECTOMY     TUBAL LIGATION     VAGINAL HYSTERECTOMY  1978   Family History  Problem Relation Age of Onset   Stomach cancer Father    Pancreatic cancer Father    Liver cancer Father    Other Sister 40       Cancer of Bile Duct   Breast cancer Sister    Diabetes Sister    Brain cancer Sister 23   Heart attack Mother    Heart failure Mother    Sarcoidosis Sister    Colon cancer Neg Hx    Esophageal cancer Neg Hx    Rectal cancer Neg Hx    Social History   Socioeconomic History   Marital status: Divorced    Spouse name: Not on file   Number of children: 3   Years of education: 12   Highest education level: High school graduate  Occupational History   Occupation: Teacher, English as a foreign language, Paediatric nurse    Comment: retired  Tobacco Use   Smoking status: Current Every Day Smoker    Packs/day: 0.50    Years: 50.00    Pack years: 25.00    Types: Cigarettes   Smokeless tobacco: Never Used  Scientific laboratory technician Use: Never used  Substance and Sexual Activity   Alcohol use: No   Drug use: No   Sexual activity: Not Currently  Other Topics Concern   Not on file  Social History Narrative   Not on file   Social Determinants of Health   Financial Resource Strain:      Difficulty of Paying Living Expenses: Not on file  Food Insecurity:    Worried About Charity fundraiser in the Last Year: Not on file   [employer] of Food in the Last Year: Not on file  Transportation Needs:    Lack of Transportation (Medical): Not on file   Lack of Transportation (Non-Medical): Not on file  Physical Activity:    Days of Exercise per Week: Not on file   Minutes of Exercise per Session: Not on file  Stress:  Feeling of Stress : Not on file  Social Connections:    Frequency of Communication with Friends and Family: Not on file   Frequency of Social Gatherings with Friends and Family: Not on file   Attends Religious Services: Not on file   Active Member of Clubs or Organizations: Not on file   Attends Archivist Meetings: Not on file   Marital Status: Not on file    Activities of Daily Living In your present state of health, do you have any difficulty performing the following activities: 08/30/2020  Hearing? N  Vision? N  Difficulty concentrating or making decisions? N  Walking or climbing stairs? N  Dressing or bathing? N  Doing errands, shopping? N  Preparing Food and eating ? N  Using the Toilet? N  In the past six months, have you accidently leaked urine? N  Do you have problems with loss of bowel control? N  Managing your Medications? N  Managing your Finances? N  Housekeeping or managing your Housekeeping? N  Some recent data might be hidden    Patient Education/ Literacy How often do you need to have someone help you when you read instructions, pamphlets, or other written materials from your doctor or pharmacy?: 1 - Never What is the last grade level you completed in school?: 12th grade  Exercise Current Exercise Habits: The patient does not participate in regular exercise at present, Exercise limited by: None identified  Diet Patient reports consuming 3 meals a day and 2 snack(s) a day Patient reports that her primary diet is:  Diabetic Patient reports that she does have regular access to food.   Depression Screen PHQ 2/9 Scores 06/18/2020 03/02/2020 12/02/2019 08/30/2019 08/23/2019 05/20/2019 01/28/2019  PHQ - 2 Score 0 0 0 0 0 0 0     Fall Risk Fall Risk  08/30/2020 06/18/2020 03/02/2020 12/02/2019 08/30/2019  Falls in the past year? 0 0 0 0 0  Number falls in past yr: - - - - 0  Injury with Fall? - - - - 0  Follow up - - - - Falls prevention discussed  Comment - - - - Get rid of all throw rugs in the house, adequate lighting in the walkways and grab bars in the bathroom.     Objective:  Lindsay Villarreal seemed alert and oriented and she participated appropriately during our telephone visit.  Blood Pressure Weight BMI  BP Readings from Last 3 Encounters:  07/12/20 115/62  06/18/20 (!) 129/70  03/02/20 122/63   Wt Readings from Last 3 Encounters:  07/12/20 135 lb (61.2 kg)  06/18/20 136 lb (61.7 kg)  03/02/20 140 lb (63.5 kg)   BMI Readings from Last 1 Encounters:  07/12/20 23.91 kg/m    *Unable to obtain current vital signs, weight, and BMI due to telephone visit type  Hearing/Vision  Izora Gala did not seem to have difficulty with hearing/understanding during the telephone conversation Reports that she has had a formal eye exam by an eye care professional within the past year Reports that she has not had a formal hearing evaluation within the past year *Unable to fully assess hearing and vision during telephone visit type  Cognitive Function: 6CIT Screen 08/30/2020 08/30/2019  What Year? 0 points 0 points  What month? 0 points 0 points  What time? 0 points 0 points  Count back from 20 0 points 0 points  Months in reverse 0 points 0 points  Repeat phrase 0 points 0 points  Total Score  0 0   (Normal:0-7, Significant for Dysfunction: >8)  Normal Cognitive Function Screening: Yes   Immunization & Health Maintenance Record Immunization History  Administered Date(s) Administered   Hepatitis B 12/01/2018    Hepatitis B, adult 02/22/2018, 12/01/2018   Influenza, High Dose Seasonal PF 10/04/2015, 09/04/2016, 09/17/2017, 08/07/2018, 10/11/2019   Influenza,inj,Quad PF,6+ Mos 09/09/2013, 08/14/2014   Moderna SARS-COVID-2 Vaccination 11/19/2019, 01/13/2020   Pneumococcal Conjugate-13 10/30/2014   Pneumococcal Polysaccharide-23 07/30/2012   Tdap 07/30/2012   Zoster Recombinat (Shingrix) 08/07/2018, 11/13/2018    Health Maintenance  Topic Date Due   OPHTHALMOLOGY EXAM  07/23/2018   COLONOSCOPY  04/29/2020   INFLUENZA VACCINE  06/17/2020   LIPID PANEL  12/19/2020   HEMOGLOBIN A1C  12/19/2020   FOOT EXAM  06/18/2021   MAMMOGRAM  09/26/2021   DEXA SCAN  12/01/2021   TETANUS/TDAP  07/30/2022   COVID-19 Vaccine  Completed   Hepatitis C Screening  Completed   PNA vac Low Risk Adult  Completed       Assessment  This is a routine wellness examination for [place] Maintenance: Due or Overdue Health Maintenance Due  Topic Date Due   OPHTHALMOLOGY EXAM  07/23/2018   COLONOSCOPY  04/29/2020   INFLUENZA VACCINE  06/17/2020    Lindsay Villarreal does not need a referral for Community Assistance: Care Management:   no Social Work:    no Prescription Assistance:  no Nutrition/Diabetes Education:  no   Plan:  Personalized Goals Goals Addressed             This Visit's Progress    DIET - EAT MORE FRUITS AND VEGETABLES       Exercise 150 min/wk Moderate Activity         Personalized Health Maintenance & Screening Recommendations  Influenza vaccine  Lung Cancer Screening Recommended: Done 03/23/2017 (Low Dose CT Chest recommended if Age 61-80 years, 30 pack-year currently smoking OR have quit w/in past 15 years) Hepatitis C Screening recommended: Done 02/04/2016 HIV Screening recommended: no  Advanced Directives: Written information was not prepared per patient's request.  Referrals & Orders No orders of the defined types were placed in this encounter.   Follow-up  Plan Follow-up with Lindsay Pretty, FNP as planned Schedule for yearly flu shot  I have personally reviewed and noted the following in the patient's chart:   Medical and social history Use of alcohol, tobacco or illicit drugs  Current medications and supplements Functional ability and status Nutritional status Physical activity Advanced directives List of other physicians Hospitalizations, surgeries, and ER visits in previous 12 months Vitals Screenings to include cognitive, depression, and falls Referrals and appointments  In addition, I have reviewed and discussed with Lindsay Villarreal certain preventive protocols, quality metrics, and best practice recommendations. A written personalized care plan for preventive services as well as general preventive health recommendations is available and can be mailed to the patient at her request.    I have reviewed and agree with the above AWV documentation.   Evelina Dun, FNP    Rolena Infante LPN [id number]

## 2020-08-30 NOTE — Patient Instructions (Signed)
  Lindsay Villarreal , Thank you for taking time to come for your Medicare Wellness Visit. I appreciate your ongoing commitment to your health goals. Please review the following plan we discussed and let me know if I can assist you in the future.   These are the goals we discussed: Goals    . DIET - EAT MORE FRUITS AND VEGETABLES    . DIET - INCREASE WATER INTAKE     Try to drink 6-8 glasses of water daily. Cut back on the diet Dr Malachi Bonds    . Exercise 150 min/wk Moderate Activity       This is a list of the screening recommended for you and due dates:  Health Maintenance  Topic Date Due  . Eye exam for diabetics  07/23/2018  . Colon Cancer Screening  04/29/2020  . Flu Shot  06/17/2020  . Lipid (cholesterol) test  12/19/2020  . Hemoglobin A1C  12/19/2020  . Complete foot exam   06/18/2021  . Mammogram  09/26/2021  . DEXA scan (bone density measurement)  12/01/2021  . Tetanus Vaccine  07/30/2022  . COVID-19 Vaccine  Completed  .  Hepatitis C: One time screening is recommended by Center for Disease Control  (CDC) for  adults born from [birth year] through 1965.   Completed  . Pneumonia vaccines  Completed

## 2020-10-09 ENCOUNTER — Ambulatory Visit: Payer: Self-pay | Admitting: General Surgery

## 2020-10-16 ENCOUNTER — Telehealth: Payer: Self-pay

## 2020-10-18 NOTE — Telephone Encounter (Signed)
Please find out what is going on with prolia injections and schedule for shot

## 2020-10-29 ENCOUNTER — Encounter: Payer: Self-pay | Admitting: Cardiology

## 2020-10-29 ENCOUNTER — Telehealth: Payer: Self-pay | Admitting: Cardiology

## 2020-10-29 ENCOUNTER — Ambulatory Visit (INDEPENDENT_AMBULATORY_CARE_PROVIDER_SITE_OTHER): Payer: Medicare HMO | Admitting: Cardiology

## 2020-10-29 ENCOUNTER — Other Ambulatory Visit: Payer: Self-pay

## 2020-10-29 VITALS — BP 104/50 | HR 75 | Resp 16 | Ht 63.5 in | Wt 136.4 lb

## 2020-10-29 DIAGNOSIS — I10 Essential (primary) hypertension: Secondary | ICD-10-CM

## 2020-10-29 DIAGNOSIS — R011 Cardiac murmur, unspecified: Secondary | ICD-10-CM | POA: Diagnosis not present

## 2020-10-29 DIAGNOSIS — E782 Mixed hyperlipidemia: Secondary | ICD-10-CM

## 2020-10-29 NOTE — Telephone Encounter (Signed)
Pre-cert Verification for the following procedure    ECHO    DATE:  11/02/2020  LOCATION: [facility]

## 2020-10-29 NOTE — Addendum Note (Signed)
Addended by: Julian Hy T on: 10/29/2020 04:08 PM   Modules accepted: Orders

## 2020-10-29 NOTE — Patient Instructions (Signed)

## 2020-10-29 NOTE — Progress Notes (Signed)
Cardiology Office Note  Date: 10/29/2020   ID: ARWEN Villarreal, DOB 08/09/1946, MRN [id number]  PCP:  [name], FNP  Cardiologist:  Rozann Lesches, MD Electrophysiologist:  None   Chief Complaint  Patient presents with  . New Patient (Initial Visit)    Heart murmur    History of Present Illness: Lindsay Villarreal is a 74 y.o. female referred for cardiology consultation by Dr. Constance Villarreal for the evaluation of heart murmur.  She was seen back in August for surgical consultation of bilateral inguinal hernias.  Cardiac murmur was described at that time with subsequent referral made.  [name] tells me that she has been aware of having a heart murmur for at least the last 5 years or so. It does not sound like she has undergone an echocardiogram. No definite family history of valvular heart disease. She states that her mother died in her late 3s after heart surgery, presumably CABG.  She does not describe any definite functional limitation. Does all of her house chores and yard work, NYHA class I-II dyspnea based on description, no exertional chest pain.  Chest CT from 2018 demonstrated aortic atherosclerosis with ectasia of the ascending thoracic aorta at 4 cm. It is certainly possible that she has a bicuspid aortic valve.  I personally reviewed her ECG which shows normal sinus rhythm with low voltage in the precordial leads.  She is being considered for herniorrhaphy eventually.  Past Medical History:  Diagnosis Date  . Anemia   . Arthritis   . Bilateral cataracts   . Essential hypertension   . GERD (gastroesophageal reflux disease)   . Heart murmur   . Hyperlipidemia   . Neuromuscular disorder ([place])   . Osteoporosis   . Type 2 diabetes mellitus ([place])     Past Surgical History:  Procedure Laterality Date  . APPENDECTOMY  1978  . BLADDER REPAIR    . CATARACT EXTRACTION W/ INTRAOCULAR LENS IMPLANT  2010 (left), 2013 (right)  . COLONOSCOPY    . EYE SURGERY    .  POLYPECTOMY    . TUBAL LIGATION    . VAGINAL HYSTERECTOMY  1978    Current Outpatient Medications  Medication Sig Dispense Refill  . aspirin 81 MG tablet Take 81 mg by mouth daily.    [name] b complex vitamins capsule Take 1 capsule by mouth daily.    . Biotin 10 MG TABS Take 4 tablets by mouth daily.    . calcium carbonate (OS-CAL) 600 MG TABS tablet Take 600 mg by mouth daily with breakfast.    . denosumab (PROLIA) 60 MG/ML SOLN injection Inject 60 mg into the skin every 6 (six) months. Administer in upper arm, thigh, or abdomen 1 mL 0  . Ferrous Sulfate (IRON) 325 (65 Fe) MG TABS Take 1 tablet (325 mg total) by mouth daily. 90 tablet 1  . gabapentin (NEURONTIN) 100 MG capsule Take 1 capsule (100 mg total) by mouth 3 (three) times daily. 270 capsule 1  . glucose blood (ONE TOUCH ULTRA TEST) test strip USE TO CHECK GLUCOSE ONCE DAILY   E11.9 100 each 9  . lisinopril (ZESTRIL) 5 MG tablet Take 1 tablet (5 mg total) by mouth daily. 90 tablet 1  . metFORMIN (GLUCOPHAGE) 500 MG tablet Take 1 tablet (500 mg total) by mouth 2 (two) times daily. 180 tablet 1  . Multiple Vitamins-Minerals (ZINC PO) Take 1 tablet by mouth daily.    [name] Lancets 25E MISC USE ONE  TO CHECK GLUCOSE ONCE DAILY Dx E11.40 100 each 3  . simvastatin (ZOCOR) 20 MG tablet Take 1 tablet (20 mg total) by mouth daily. 90 tablet 1   No current facility-administered medications for this visit.   Allergies:  Codeine   Social History: The patient  reports that she has been smoking cigarettes. She has a 25.00 pack-year smoking history. She has never used smokeless tobacco. She reports that she does not drink alcohol and does not use drugs.   Family History: The patient's family history includes Brain cancer (age of onset: 56) in her sister; Breast cancer in her sister; Diabetes in her sister; Heart attack in her mother; Heart failure in her mother; Liver cancer in her father; Other (age of onset: 83) in her sister;  Pancreatic cancer in her father; Sarcoidosis in her sister; Stomach cancer in her father.   ROS: No palpitations or syncope.  Physical Exam: VS:  BP (!) 104/50   Pulse 75   Resp 16   Ht 5' 3.5" (1.613 m)   Wt 136 lb 6.4 oz (61.9 kg)   SpO2 99%   BMI 23.78 kg/m , BMI Body mass index is 23.78 kg/m.  Wt Readings from Last 3 Encounters:  10/29/20 136 lb 6.4 oz (61.9 kg)  07/12/20 135 lb (61.2 kg)  06/18/20 136 lb (61.7 kg)    General: Patient appears comfortable at rest. HEENT: Conjunctiva and lids normal, wearing a mask. Neck: Supple, no elevated JVP or carotid bruits, no thyromegaly. Lungs: Clear to auscultation, nonlabored breathing at rest. Cardiac: Regular rate and rhythm, no S3, 3/6 harsh basal systolic murmur, no pericardial rub. Abdomen: Soft, nontender, bowel sounds present. Extremities: No pitting edema, distal pulses 2+. Skin: Warm and dry. Musculoskeletal: No kyphosis. Neuropsychiatric: Alert and oriented x3, affect grossly appropriate.  ECG:  An ECG dated 10/22/2016 was personally reviewed today and demonstrated:  Normal sinus rhythm.  Recent Labwork: 06/18/2020: ALT 10; AST 10; BUN 13; Creatinine, Ser 0.54; Hemoglobin 12.8; Platelets 305; Potassium 4.6; Sodium 143     Component Value Date/Time   CHOL 111 06/18/2020 0825   CHOL 134 06/02/2013 0854   TRIG 111 06/18/2020 0825   TRIG 182 (H) 02/19/2015 1041   TRIG 126 06/02/2013 0854   HDL 41 06/18/2020 0825   HDL 35 (L) 02/19/2015 1041   HDL 35 (L) 06/02/2013 0854   CHOLHDL 2.7 06/18/2020 0825   LDLCALC 50 06/18/2020 0825   LDLCALC 46 07/21/2014 0903   LDLCALC 74 06/02/2013 0854    Other Studies Reviewed Today:  Chest CT 03/23/2017: IMPRESSION: 1. Lung-RADS Category 1-S, negative. Continue annual screening with low-dose chest CT without contrast in 12 months. 2. The "S" modifier above refers to potentially clinically significant non lung cancer related findings. Specifically, three-vessel coronary  atherosclerosis. 3. Aortic atherosclerosis. Ectatic 4.0 cm ascending thoracic aorta. Recommend annual imaging followup by CTA or MRA. This recommendation follows 2010 ACCF/AHA/AATS/ACR/ASA/SCA/SCAI/SIR/STS/SVM Guidelines for the Diagnosis and Management of Patients with Thoracic Aortic Disease. Circulation. 2010; 121: X540-G867. 4. Mild emphysema.  Assessment and Plan:  1. Cardiac murmur in aortic position consistent with aortic stenosis. She could possibly have a bicuspid aortic valve in light of ascending thoracic aortic ectasia by prior CT imaging in 2018, she is not aware of any family history of valvular heart disease. No definite major functional limitation at this time as noted above. We will obtain an echocardiogram.  2. Essential hypertension, blood pressure low normal today. She is on lisinopril.  3. Mixed hyperlipidemia,  on Zocor.  Medication Adjustments/Labs and Tests Ordered: Current medicines are reviewed at length with the patient today.  Concerns regarding medicines are outlined above.   Tests Ordered: Orders Placed This Encounter  Procedures  . ECHOCARDIOGRAM COMPLETE    Medication Changes: No orders of the defined types were placed in this encounter.   Disposition:  Follow up test results and determine next step.  Signed, Satira Sark, MD, [facility] 10/29/2020 2:07 PM    [facility] [place], [place], [place] [address] Phone: [phone]; Fax: [phone]

## 2020-11-01 ENCOUNTER — Ambulatory Visit: Payer: Self-pay | Admitting: General Surgery

## 2020-11-01 NOTE — Telephone Encounter (Signed)
Pt is calling back to check on prolia shot. She has been waiting 2 weeks

## 2020-11-01 NOTE — Telephone Encounter (Signed)
Discussed with patient. See other phone note

## 2020-11-01 NOTE — Telephone Encounter (Signed)
Prolia has been sent for verification through [place]. Patient notified that we will contact her to set up an appt as soon as we get approval. Patient verbalized understanding

## 2020-11-02 ENCOUNTER — Ambulatory Visit (HOSPITAL_COMMUNITY)
Admission: RE | Admit: 2020-11-02 | Discharge: 2020-11-02 | Disposition: A | Discharge status: Home / Self Care | Payer: Medicare HMO | Source: Ambulatory Visit | Attending: Cardiology | Admitting: Cardiology

## 2020-11-02 ENCOUNTER — Other Ambulatory Visit: Payer: Self-pay

## 2020-11-02 DIAGNOSIS — R011 Cardiac murmur, unspecified: Secondary | ICD-10-CM

## 2020-11-02 LAB — ECHOCARDIOGRAM COMPLETE
AR max vel: 2.1 cm2
AV Area VTI: 2.13 cm2
AV Area mean vel: 2.02 cm2
AV Mean grad: 8 mmHg
AV Peak grad: 14.6 mmHg
Ao pk vel: 1.91 m/s
Area-P 1/2: 2.23 cm2
S' Lateral: 2.6 cm

## 2020-11-02 NOTE — Progress Notes (Signed)
*  PRELIMINARY RESULTS* Echocardiogram 2D Echocardiogram has been performed.  Samuel Germany 11/02/2020, 10:08 AM

## 2020-11-05 ENCOUNTER — Telehealth: Payer: Self-pay | Admitting: *Deleted

## 2020-11-05 NOTE — Telephone Encounter (Signed)
Patient informed. Copy sent to PCP °

## 2020-11-05 NOTE — Telephone Encounter (Signed)
-----   Message from Satira Sark, MD sent at 11/02/2020 12:17 PM EST ----- Results reviewed.  I also reviewed the images.  LVEF is vigorous at 65 to 70% with mild LVH and mild diastolic dysfunction.  LVOT relatively small with basal septal hypertrophy.  The aortic valve is calcified and thickened but not stenotic.  There is also mild to moderate mitral and tricuspid regurgitation.  Cardiac murmur likely combination of above features.  Importantly, this should not affect her plans for potential herniorrhaphy.  We should see her back in 1 year for follow-up.

## 2020-11-13 ENCOUNTER — Ambulatory Visit (INDEPENDENT_AMBULATORY_CARE_PROVIDER_SITE_OTHER): Payer: Medicare HMO | Admitting: General Surgery

## 2020-11-13 ENCOUNTER — Encounter: Payer: Self-pay | Admitting: General Surgery

## 2020-11-13 ENCOUNTER — Other Ambulatory Visit: Payer: Self-pay

## 2020-11-13 ENCOUNTER — Ambulatory Visit (INDEPENDENT_AMBULATORY_CARE_PROVIDER_SITE_OTHER): Payer: Medicare HMO

## 2020-11-13 VITALS — BP 144/76 | HR 64 | Temp 98.2°F | Resp 14 | Ht 63.0 in | Wt 138.0 lb

## 2020-11-13 DIAGNOSIS — K4090 Unilateral inguinal hernia, without obstruction or gangrene, not specified as recurrent: Secondary | ICD-10-CM | POA: Diagnosis not present

## 2020-11-13 DIAGNOSIS — M810 Age-related osteoporosis without current pathological fracture: Secondary | ICD-10-CM

## 2020-11-13 MED ORDER — DENOSUMAB 60 MG/ML ~~LOC~~ SOSY
60.0000 mg | PREFILLED_SYRINGE | Freq: Once | SUBCUTANEOUS | Status: AC
  Administered 2020-11-13: 16:00:00 60 mg via SUBCUTANEOUS

## 2020-11-13 NOTE — Progress Notes (Signed)
[facility] Note   HPI:  74 y.o. Female presents to clinic for follow-up evaluation for her bilateral inguinal hernia. She has reported only right sided pain when I saw her in August, and we had referred her for Cardiology evaluation given her heart murmur that was not really worked up. ECHO was done and no stenosis noted of the aortic valve. She was deemed acceptable risk for surgery and follow up with Cardiology for her murmur.   Review of Systems:  Right and left sided pain now in the groin Bulges growing All other review of systems: otherwise negative   Vital Signs:  BP (!) 144/76   Pulse 64   Temp 98.2 F (36.8 C) (Oral)   Resp 14   Ht 5\' 3"  (1.6 m)   Wt 138 lb (62.6 kg)   SpO2 97%   BMI 24.45 kg/m    Physical Exam:  Physical Exam Vitals reviewed.  Cardiovascular:     Rate and Rhythm: Normal rate.  Pulmonary:     Effort: Pulmonary effort is normal.  Abdominal:     General: There is no distension.     Palpations: Abdomen is soft.     Tenderness: There is abdominal tenderness.     Hernia: A hernia is present. Hernia is present in the left inguinal area and right inguinal area.     Comments: Reducible bilateral inguinal hernia, mildly tender   Neurological:     Mental Status: She is alert.      Assessment:  74 y.o. yo Female with bilateral inguinal hernias. When we had talked in August she was only symptomatic on the right and needing cardiology evaluation. Now she is symptomatic on both sides. Given this I had discussed the possibility of referral to [facility] for laparoscopic /robotic bilateral inguinal hernia repair. Discussed that this would take care of both at the same time. Discussed that I would not recommend bilateral open repairs. Discussed that her bladder sling should not prevent this but if there is any issues we would be happy to see her back.   Plan:  Referral to [facility] for possible laparoscopic versus robotic inguinal hernia for bilateral  hernias  Will see back PRN Cardiology information in Epic  September, MD [facility] [address] [place], [place] [phone] (office)

## 2020-11-13 NOTE — Progress Notes (Signed)
Prolia injection given in LUA without difficulty. Pt tolerated well.

## 2020-11-13 NOTE — Patient Instructions (Signed)
Referral for Possible Laparoscopic bilateral hernia repair    Inguinal Hernia, Adult An inguinal hernia is when fat or your intestines push through a weak spot in a muscle where your leg meets your lower belly (groin). This causes a rounded lump (bulge). This kind of hernia could also be:  In your scrotum, if you are female.  In folds of skin around your vagina, if you are female. There are three types of inguinal hernias. These include:  Hernias that can be pushed back into the belly (are reducible). This type rarely causes pain.  Hernias that cannot be pushed back into the belly (are incarcerated).  Hernias that cannot be pushed back into the belly and lose their blood supply (are strangulated). This type needs emergency surgery. If you do not have symptoms, you may not need treatment. If you have symptoms or a large hernia, you may need surgery. Follow these instructions at home: Lifestyle  Do these things if told by your doctor so you do not have trouble pooping (constipation): ? Drink enough fluid to keep your pee (urine) pale yellow. ? Eat foods that have a lot of fiber. These include fresh fruits and vegetables, whole grains, and beans. ? Limit foods that are high in fat and processed sugars. These include foods that are fried or sweet. ? Take medicine for trouble pooping.  Avoid lifting heavy objects.  Avoid standing for long amounts of time.  Do not use any products that contain nicotine or tobacco. These include cigarettes and e-cigarettes. If you need help quitting, ask your doctor.  Stay at a healthy weight. General instructions  You may try to push your hernia in by very gently pressing on it when you are lying down. Do not try to force the bulge back in if it will not push in easily.  Watch your hernia for any changes in shape, size, or color. Tell your doctor if you see any changes.  Take over-the-counter and prescription medicines only as told by your  doctor.  Keep all follow-up visits as told by your doctor. This is important. Contact a doctor if:  You have a fever.  You have new symptoms.  Your symptoms get worse. Get help right away if:  The area where your leg meets your lower belly has: ? Pain that gets worse suddenly. ? A bulge that gets bigger suddenly, and it does not get smaller after that. ? A bulge that turns red or purple. ? A bulge that is painful when you touch it.  You are a man, and your scrotum: ? Suddenly feels painful. ? Suddenly changes in size.  You cannot push the hernia in by very gently pressing on it when you are lying down. Do not try to force the bulge back in if it will not push in easily.  You feel sick to your stomach (nauseous), and that feeling does not go away.  You throw up (vomit), and that keeps happening.  You have a fast heartbeat.  You cannot poop (have a bowel movement) or pass gas. These symptoms may be an emergency. Do not wait to see if the symptoms will go away. Get medical help right away. Call your local emergency services (911 in the [place] Summary  An inguinal hernia is when fat or your intestines push through a weak spot in a muscle where your leg meets your lower belly (groin). This causes a rounded lump (bulge).  If you do not have symptoms, you may not need  treatment. If you have symptoms or a large hernia, you may need surgery.  Avoid lifting heavy objects. Also avoid standing for long amounts of time.  Do not try to force the bulge back in if it will not push in easily. This information is not intended to replace advice given to you by your health care provider. Make sure you discuss any questions you have with your health care provider. Document Revised: 12/05/2017 Document Reviewed: 08/05/2017 Elsevier Patient Education  [place].   Laparoscopic Inguinal Hernia Repair, Adult  Laparoscopic inguinal hernia repair is a surgical procedure to repair a  small weak spot in the groin muscles that allows fat or intestine from inside the abdomen to bulge out (inguinal hernia). This procedure may be planned, or it may be an emergency procedure. During the procedure, tissue that has bulged out is moved back into place, and the opening in the groin muscles is repaired. This is done through three small incisions in the abdomen. A thin tube with a light and camera on the end (laparoscope) will be used to help perform the procedure. Tell a health care provider about:  Any allergies you have.  All medicines you are taking, including vitamins, herbs, eye drops, creams, and over-the-counter medicines.  Any problems you or family members have had with anesthetic medicines.  Any blood disorders you have.  Any surgeries you have had.  Any medical conditions you have.  Whether you are pregnant or may be pregnant. What are the risks? Generally, this is a safe procedure. However, problems may occur, including:  Infection.  Bleeding.  Allergic reactions to medicines.  Damage to other structures or organs.  Long-term pain and swelling of the scrotum, in men.  Testicle damage in men.  Inability to completely empty the bladder (urinary retention).  Blood clots.  A collection of fluid that builds up under the skin (seroma).  The hernia coming back (recurrence). What happens before the procedure? Medicines  Ask your health care provider about: ? Changing or stopping your regular medicines. This is especially important if you are taking diabetes medicines or blood thinners. ? Taking over-the-counter medicines, vitamins, herbs, and supplements. ? Taking medicines such as aspirin and ibuprofen. These medicines can thin your blood. Do not take these medicines unless your health care provider tells you to take them.  You may be given antibiotic medicine to help prevent an infection. General instructions  Do not use any products that contain  nicotine or tobacco, such as cigarettes and e-cigarettes. If you need help quitting, ask your health care provider.  You may be asked to shower with a germ-killing soap.  Plan to have someone take you home from the hospital or clinic.  Plan to have a responsible adult care for you for at least 24 hours after you leave the hospital or clinic. This is important. What happens during the procedure?  To lower your risk of infection: ? Your health care team will wash or sanitize their hands. ? Hair may be removed from the surgical area. ? Your skin will be washed with soap.  An IV will be inserted into one of your veins.  You will be given one or more of the following: ? A medicine to help you relax (sedative). ? A medicine to make you fall asleep (general anesthetic).  Three small incisions will be made in your abdomen.  Your abdomen will be inflated with carbon dioxide gas to make the surgical area easier to see.  A laparoscope and surgical instruments will be inserted through the incisions. The laparoscope will send images of the inside of your abdomen to a monitor in the room.  Tissue that is bulging through the hernia may be removed or moved back into its normal place.  The hernia opening will be closed with a sheet of surgical mesh.  The surgical instruments and laparoscope will be removed.  Your incisions will be closed with stitches (sutures) and adhesive strips.  A bandage (dressing) will be placed over your incisions. The procedure may vary among health care providers and hospitals. What happens after the procedure?  Your blood pressure, heart rate, breathing rate, and blood oxygen level will be monitored until the medicines you were given have worn off.  You will be given pain medicine as needed.  You may continue to receive medicines and fluids through an IV. The IV will be removed after you can drink fluids well.  You will be encouraged to get up and move around,  and to take deep breaths frequently.  Do not drive for 24 hours if you were given a sedative during your procedure. Summary  Laparoscopic inguinal hernia repair is a surgical procedure to repair a small weak spot in the groin muscles that allows fat or intestine from inside the abdomen to bulge out (inguinal hernia).  This procedure is done through three small incisions in the abdomen. A thin tube with a light and camera on the end (laparoscope) will be used to help perform the procedure.  After the procedure, you will be encouraged to get up and move around, and to take deep breaths frequently. This information is not intended to replace advice given to you by your health care provider. Make sure you discuss any questions you have with your health care provider. Document Revised: 04/13/2019 Document Reviewed: 02/12/2017 Elsevier Patient Education  [place].

## 2020-11-27 ENCOUNTER — Encounter: Payer: Self-pay | Admitting: Nurse Practitioner

## 2020-11-27 ENCOUNTER — Ambulatory Visit (INDEPENDENT_AMBULATORY_CARE_PROVIDER_SITE_OTHER): Payer: Medicare HMO | Admitting: Nurse Practitioner

## 2020-11-27 VITALS — Temp 97.2°F

## 2020-11-27 DIAGNOSIS — R0981 Nasal congestion: Secondary | ICD-10-CM

## 2020-11-27 MED ORDER — AMOXICILLIN-POT CLAVULANATE 875-125 MG PO TABS: 1.0000 | ORAL_TABLET | Freq: Two times a day (BID) | ORAL | 0 refills | Status: DC

## 2020-11-27 MED ORDER — FLUTICASONE PROPIONATE 50 MCG/ACT NA SUSP: 2.0000 | Freq: Every day | NASAL | 2 refills | Status: DC

## 2020-11-27 MED ORDER — DM-GUAIFENESIN ER 30-600 MG PO TB12: 1.0000 | ORAL_TABLET | Freq: Two times a day (BID) | ORAL | 0 refills | Status: DC

## 2020-11-27 NOTE — Patient Instructions (Signed)

## 2020-11-27 NOTE — Assessment & Plan Note (Signed)
Sinus congestion not well controlled.  Symptoms worsening in the last 5 days.  Patient is reporting sinus congestion and chest congestion, with cough, sinus pressure, mild fevers, nausea without vomiting.  Headache and lethargy with muscle weakness.  Started patient on Augmentin, Tylenol for headache and fever, Mucinex for cough and congestion.  Encourage hydration  Follow-up for worsening hours of symptoms   Rx sent to pharmacy.

## 2020-11-27 NOTE — Progress Notes (Signed)
   Virtual Visit via telephone Note Due to COVID-19 pandemic this visit was conducted virtually. This visit type was conducted due to national recommendations for restrictions regarding the COVID-19 Pandemic (e.g. social distancing, sheltering in place) in an effort to limit this patient's exposure and mitigate transmission in our community. All issues noted in this document were discussed and addressed.  A physical exam was not performed with this format.  I connected with Lindsay Villarreal on 11/27/20 at  9:15 AM by telephone and verified that I am speaking with the correct person using two identifiers. Lindsay Villarreal is currently located at home and no one is currently with patient during visit. The provider, Ivy Lynn, NP is located in their office at time of visit.  I discussed the limitations, risks, security and privacy concerns of performing an evaluation and management service by telephone and the availability of in person appointments. I also discussed with the patient that there may be a patient responsible charge related to this service. The patient expressed understanding and agreed to proceed.   History and Present Illness:  Sinusitis This is a new problem. Episode onset: In the last 5 days. The problem is unchanged. The maximum temperature recorded prior to her arrival was 100.4 - 100.9 F (Night fevers). The fever has been present for less than 1 day. The pain is moderate. Associated symptoms include chills, congestion, coughing, headaches and sinus pressure. Pertinent negatives include no ear pain, shortness of breath, sore throat or swollen glands. Past treatments include oral decongestants and spray decongestants. The treatment provided no relief.      Review of Systems  Constitutional: Positive for chills and fever.  HENT: Positive for congestion, sinus pressure and sinus pain. Negative for ear pain and sore throat.   Respiratory: Positive for cough. Negative for shortness  of breath and wheezing.   Gastrointestinal: Positive for nausea. Negative for diarrhea and vomiting.  Skin: Negative for rash.  Neurological: Positive for headaches.     Observations/Objective: Telephone visit  Assessment and Plan:  Sinus congestion Sinus congestion not well controlled.  Symptoms worsening in the last 5 days.  Patient is reporting sinus congestion and chest congestion, with cough, sinus pressure, mild fevers, nausea without vomiting.  Headache and lethargy with muscle weakness.  Started patient on Augmentin, Tylenol for headache and fever, Mucinex for cough and congestion.  Encourage hydration  Follow-up for worsening hours of symptoms   Rx sent to pharmacy.  Follow Up Instructions:   Follow-up for worsening unresolved symptoms   I discussed the assessment and treatment plan with the patient. The patient was provided an opportunity to ask questions and all were answered. The patient agreed with the plan and demonstrated an understanding of the instructions.   The patient was advised to call back or seek an in-person evaluation if the symptoms worsen or if the condition fails to improve as anticipated.  The above assessment and management plan was discussed with the patient. The patient verbalized understanding of and has agreed to the management plan. Patient is aware to call the clinic if symptoms persist or worsen. Patient is aware when to return to the clinic for a follow-up visit. Patient educated on when it is appropriate to go to the emergency department.     I provided 11 minutes of non-face-to-face time during this encounter.    Ivy Lynn, NP

## 2020-11-29 LAB — SARS-COV-2, NAA 2 DAY TAT

## 2020-11-29 LAB — NOVEL CORONAVIRUS, NAA: SARS-CoV-2, NAA: DETECTED — AB

## 2020-12-04 ENCOUNTER — Ambulatory Visit: Payer: Medicare HMO | Admitting: General Surgery

## 2020-12-17 ENCOUNTER — Other Ambulatory Visit: Payer: Self-pay | Admitting: Nurse Practitioner

## 2020-12-17 DIAGNOSIS — Z1231 Encounter for screening mammogram for malignant neoplasm of breast: Secondary | ICD-10-CM

## 2020-12-19 ENCOUNTER — Other Ambulatory Visit: Payer: Self-pay | Admitting: Nurse Practitioner

## 2020-12-19 DIAGNOSIS — E118 Type 2 diabetes mellitus with unspecified complications: Secondary | ICD-10-CM

## 2020-12-19 DIAGNOSIS — E1143 Type 2 diabetes mellitus with diabetic autonomic (poly)neuropathy: Secondary | ICD-10-CM

## 2020-12-19 DIAGNOSIS — E782 Mixed hyperlipidemia: Secondary | ICD-10-CM

## 2020-12-19 DIAGNOSIS — I10 Essential (primary) hypertension: Secondary | ICD-10-CM

## 2020-12-20 ENCOUNTER — Ambulatory Visit: Payer: Self-pay | Admitting: Nurse Practitioner

## 2020-12-20 ENCOUNTER — Other Ambulatory Visit: Payer: Self-pay | Admitting: Nurse Practitioner

## 2020-12-20 DIAGNOSIS — D509 Iron deficiency anemia, unspecified: Secondary | ICD-10-CM

## 2021-01-08 ENCOUNTER — Other Ambulatory Visit: Payer: Self-pay

## 2021-01-08 ENCOUNTER — Encounter: Payer: Self-pay | Admitting: Nurse Practitioner

## 2021-01-08 ENCOUNTER — Ambulatory Visit (INDEPENDENT_AMBULATORY_CARE_PROVIDER_SITE_OTHER): Payer: Medicare HMO | Admitting: Nurse Practitioner

## 2021-01-08 VITALS — BP 119/66 | HR 66 | Temp 98.0°F | Resp 20 | Ht 63.0 in | Wt 137.0 lb

## 2021-01-08 DIAGNOSIS — I10 Essential (primary) hypertension: Secondary | ICD-10-CM

## 2021-01-08 DIAGNOSIS — K219 Gastro-esophageal reflux disease without esophagitis: Secondary | ICD-10-CM

## 2021-01-08 DIAGNOSIS — E782 Mixed hyperlipidemia: Secondary | ICD-10-CM

## 2021-01-08 DIAGNOSIS — D509 Iron deficiency anemia, unspecified: Secondary | ICD-10-CM | POA: Diagnosis not present

## 2021-01-08 DIAGNOSIS — E118 Type 2 diabetes mellitus with unspecified complications: Secondary | ICD-10-CM

## 2021-01-08 DIAGNOSIS — E1143 Type 2 diabetes mellitus with diabetic autonomic (poly)neuropathy: Secondary | ICD-10-CM | POA: Diagnosis not present

## 2021-01-08 DIAGNOSIS — M810 Age-related osteoporosis without current pathological fracture: Secondary | ICD-10-CM | POA: Diagnosis not present

## 2021-01-08 LAB — CMP14+EGFR
ALT: 13 IU/L (ref 0–32)
AST: 16 IU/L (ref 0–40)
Albumin/Globulin Ratio: 2 (ref 1.2–2.2)
Albumin: 4.2 g/dL (ref 3.7–4.7)
Alkaline Phosphatase: 56 IU/L (ref 44–121)
BUN/Creatinine Ratio: 23 (ref 12–28)
BUN: 14 mg/dL (ref 8–27)
Bilirubin Total: 0.3 mg/dL (ref 0.0–1.2)
CO2: 23 mmol/L (ref 20–29)
Calcium: 9 mg/dL (ref 8.7–10.3)
Chloride: 100 mmol/L (ref 96–106)
Creatinine, Ser: 0.61 mg/dL (ref 0.57–1.00)
GFR calc Af Amer: 103 mL/min/{1.73_m2} (ref >59)
GFR calc non Af Amer: 90 mL/min/{1.73_m2} (ref >59)
Globulin, Total: 2.1 g/dL (ref 1.5–4.5)
Glucose: 149 mg/dL — ABNORMAL HIGH (ref 65–99)
Potassium: 4.6 mmol/L (ref 3.5–5.2)
Sodium: 137 mmol/L (ref 134–144)
Total Protein: 6.3 g/dL (ref 6.0–8.5)

## 2021-01-08 LAB — CBC WITH DIFFERENTIAL/PLATELET
Basophils Absolute: 0.1 10*3/uL (ref 0.0–0.2)
Basos: 1 %
EOS (ABSOLUTE): 0.1 10*3/uL (ref 0.0–0.4)
Eos: 1 %
Hematocrit: 41.2 % (ref 34.0–46.6)
Hemoglobin: 13.6 g/dL (ref 11.1–15.9)
Immature Grans (Abs): 0 10*3/uL (ref 0.0–0.1)
Immature Granulocytes: 0 %
Lymphocytes Absolute: 2.3 10*3/uL (ref 0.7–3.1)
Lymphs: 28 %
MCH: 29.8 pg (ref 26.6–33.0)
MCHC: 33 g/dL (ref 31.5–35.7)
MCV: 90 fL (ref 79–97)
Monocytes Absolute: 0.5 10*3/uL (ref 0.1–0.9)
Monocytes: 6 %
Neutrophils Absolute: 5.2 10*3/uL (ref 1.4–7.0)
Neutrophils: 64 %
Platelets: 301 10*3/uL (ref 150–450)
RBC: 4.57 x10E6/uL (ref 3.77–5.28)
RDW: 12.2 % (ref 11.7–15.4)
WBC: 8.1 10*3/uL (ref 3.4–10.8)

## 2021-01-08 LAB — LIPID PANEL
Chol/HDL Ratio: 2.7 ratio (ref 0.0–4.4)
Cholesterol, Total: 113 mg/dL (ref 100–199)
HDL: 42 mg/dL (ref >39)
LDL Chol Calc (NIH): 48 mg/dL (ref 0–99)
Triglycerides: 127 mg/dL (ref 0–149)
VLDL Cholesterol Cal: 23 mg/dL (ref 5–40)

## 2021-01-08 LAB — BAYER DCA HB A1C WAIVED: HB A1C (BAYER DCA - WAIVED): 6.4 % (ref <7.0)

## 2021-01-08 MED ORDER — SIMVASTATIN 20 MG PO TABS: 20.0000 mg | ORAL_TABLET | Freq: Every day | ORAL | 1 refills | Status: DC

## 2021-01-08 MED ORDER — GABAPENTIN 100 MG PO CAPS: 100.0000 mg | ORAL_CAPSULE | Freq: Three times a day (TID) | ORAL | 1 refills | Status: DC

## 2021-01-08 MED ORDER — IRON 325 (65 FE) MG PO TABS
1.0000 | ORAL_TABLET | Freq: Every day | ORAL | 1 refills | Status: DC
Start: 2021-01-08 — End: 2021-04-22

## 2021-01-08 MED ORDER — LISINOPRIL 5 MG PO TABS: 5.0000 mg | ORAL_TABLET | Freq: Every day | ORAL | 1 refills | Status: DC

## 2021-01-08 MED ORDER — METFORMIN HCL 500 MG PO TABS: 500.0000 mg | ORAL_TABLET | Freq: Two times a day (BID) | ORAL | 1 refills | Status: DC

## 2021-01-08 NOTE — Progress Notes (Signed)
Subjective:    Patient ID: Lindsay Villarreal, female    DOB: 1946/03/18, 75 y.o.   MRN: [id number]   Chief Complaint: Medical Management of Chronic Issues    HPI:  1. Mixed hyperlipidemia Tries to follow a low fat diet. Not exercising regularly right now. Usually moves more in warmer weather.  Taking simvastatin. No issues with this.  Lab Results  Component Value Date   CHOL 111 06/18/2020   HDL 41 06/18/2020   LDLCALC 50 06/18/2020   TRIG 111 06/18/2020   CHOLHDL 2.7 06/18/2020    2. Type 2 diabetes mellitus with complication, without long-term current use of insulin (HCC) Doesn't usually follow low sugar/carb at home. Fasting AM FSBG around 109-115. Checks every few weeks.  Taking metformin with no complications.   Lab Results  Component Value Date   HGBA1C 6.4 06/18/2020     3. Primary hypertension Taking lisinopril. No cough or SE with this. Does not add salt to foods and tries to maintain a low sodium diet.   BP Readings from Last 3 Encounters:  01/08/21 119/66  11/13/20 (!) 144/76  10/29/20 (!) 104/50    4. Gastroesophageal reflux disease without esophagitis Does not take anything for this. Managing with diet and lifestyle modifications. Avoiding spicy foods and eating late at night.   5. Age-related osteoporosis without current pathological fracture No recent falls or concerns right now. Taking Os-cal and Prolia with no complications.   6. Diabetic neuropathy associated with type 2 diabetes mellitus ([place]) Taking Neurotin with no complications. Gives her good relief.   7. Anemia Taking Fe supplement with no complaints. Denies constipation. Adds leafy vegetables to diet regularly.    Outpatient Encounter Medications as of 01/08/2021  Medication Sig  . aspirin 81 MG tablet Take 81 mg by mouth daily.  [name] b complex vitamins capsule Take 1 capsule by mouth daily.  . Biotin 10 MG TABS Take 4 tablets by mouth daily.  . calcium carbonate (OS-CAL) 600 MG TABS  tablet Take 600 mg by mouth daily with breakfast.  . denosumab (PROLIA) 60 MG/ML SOLN injection Inject 60 mg into the skin every 6 (six) months. Administer in upper arm, thigh, or abdomen  . Ferrous Sulfate (IRON) 325 (65 Fe) MG TABS Take 1 tablet by mouth once daily  . gabapentin (NEURONTIN) 100 MG capsule TAKE 1 CAPSULE BY MOUTH THREE TIMES DAILY  . glucose blood (ONE TOUCH ULTRA TEST) test strip USE TO CHECK GLUCOSE ONCE DAILY   E11.9  . lisinopril (ZESTRIL) 5 MG tablet Take 1 tablet by mouth once daily  . metFORMIN (GLUCOPHAGE) 500 MG tablet Take 1 tablet by mouth twice daily  . Multiple Vitamins-Minerals (ZINC PO) Take 1 tablet by mouth daily.  [name] Lancets 74Q MISC USE ONE  TO CHECK GLUCOSE ONCE DAILY Dx E11.40  . simvastatin (ZOCOR) 20 MG tablet Take 1 tablet by mouth once daily  . [DISCONTINUED] amoxicillin-clavulanate (AUGMENTIN) 875-125 MG tablet Take 1 tablet by mouth 2 (two) times daily.  . [DISCONTINUED] dextromethorphan-guaiFENesin (MUCINEX DM) 30-600 MG 12hr tablet Take 1 tablet by mouth 2 (two) times daily.  . [DISCONTINUED] fluticasone (FLONASE) 50 MCG/ACT nasal spray Place 2 sprays into both nostrils daily.   No facility-administered encounter medications on file as of 01/08/2021.    Past Surgical History:  Procedure Laterality Date  . APPENDECTOMY  1978  . BLADDER REPAIR    . CATARACT EXTRACTION W/ INTRAOCULAR LENS IMPLANT  2010 (left), 2013 (right)  . COLONOSCOPY    .  EYE SURGERY    . POLYPECTOMY    . TUBAL LIGATION    . VAGINAL HYSTERECTOMY  1978    Family History  Problem Relation Age of Onset  . Stomach cancer Father   . Pancreatic cancer Father   . Liver cancer Father   . Other Sister 27       Cancer of Bile Duct  . Breast cancer Sister   . Diabetes Sister   . Brain cancer Sister 41  . Heart attack Mother   . Heart failure Mother   . Sarcoidosis Sister   . Colon cancer Neg Hx   . Esophageal cancer Neg Hx   . Rectal cancer Neg Hx      New complaints: None. Waiting on her appt for her bilateral inguinal hernia repairs. Had [place] in January but recovering well.   Social history: Lives at home by herself. No issues affording her medications and groceries.   Controlled substance contract: N/A   Review of Systems  Constitutional: Negative for chills, fatigue and fever.  Respiratory: Negative for cough, shortness of breath and wheezing.   Cardiovascular: Negative for chest pain and palpitations.  Gastrointestinal: Negative for abdominal pain, constipation and diarrhea.  Genitourinary: Positive for frequency. Negative for difficulty urinating, dysuria and hematuria.  Neurological: Negative for dizziness, light-headedness, numbness and headaches.       Objective:   Physical Exam Constitutional:      General: She is not in acute distress.    Appearance: Normal appearance.  Neck:     Vascular: No carotid bruit.  Cardiovascular:     Rate and Rhythm: Normal rate and regular rhythm.     Pulses: Normal pulses.     Heart sounds: Murmur heard.    Pulmonary:     Effort: Pulmonary effort is normal.     Breath sounds: Normal breath sounds.  Abdominal:     General: Bowel sounds are normal.     Palpations: Abdomen is soft.  Musculoskeletal:        General: Normal range of motion.     Cervical back: Normal range of motion and neck supple.  Skin:    General: Skin is warm and dry.     Capillary Refill: Capillary refill takes less than 2 seconds.  Neurological:     Mental Status: She is alert and oriented to person, place, and time. Mental status is at baseline.  Psychiatric:        Mood and Affect: Mood normal.        Behavior: Behavior normal.        Thought Content: Thought content normal.        Judgment: Judgment normal.    Vitals:   01/08/21 0934  BP: 119/66  Pulse: 66  Resp: 20  Temp: 98 F (36.7 C)  SpO2: 98%        Assessment & Plan:    Lindsay Villarreal comes in today with chief complaint of  Medical Management of Chronic Issues   Diagnosis and orders addressed:  1. Mixed hyperlipidemia Continue low fat diet, increasing exercise/movement as the weather continues to warm up. Continue simvastatin.  - Lipid panel - simvastatin (ZOCOR) 20 MG tablet; Take 1 tablet (20 mg total) by mouth daily.  Dispense: 90 tablet; Refill: 1  2. Type 2 diabetes mellitus with complication, without long-term current use of insulin (HCC) A1C today is 6.4%. Continue to monitor home blood sugars. Avoid excess sugar and carbohydrates. Increase activity.   Bayer DCA Hb  A1c Waived - Microalbumin / creatinine urine ratio - metFORMIN (GLUCOPHAGE) 500 MG tablet; Take 1 tablet (500 mg total) by mouth 2 (two) times daily.  Dispense: 180 tablet; Refill: 1  3. Primary hypertension Continue lisinopril. Continue low sodium diet and monitoring Bps at home.  - CBC with Differential/Platelet - CMP14+EGFR  4. Gastroesophageal reflux disease without esophagitis Continue to control with lifestyle modifications and and diet.   5. Age-related osteoporosis without current pathological fracture Continue [place] and Prolia.   6. Diabetic autonomic neuropathy associated with type 2 diabetes mellitus (HCC) Continue Neurontin as prescribed. Continue to check feet regularly.  - gabapentin (NEURONTIN) 100 MG capsule; Take 1 capsule (100 mg total) by mouth 3 (three) times daily.  Dispense: 270 capsule; Refill: 1  7. Essential hypertension Continue medication as prescribed. Avoid excess sodium, increase activity.  - lisinopril (ZESTRIL) 5 MG tablet; Take 1 tablet (5 mg total) by mouth daily.  Dispense: 90 tablet; Refill: 1  8. Iron deficiency anemia, unspecified iron deficiency anemia type Continue medication as prescribed. Add leafy green vegetables to diet regularly.  - Ferrous Sulfate (IRON) 325 (65 Fe) MG TABS; Take 1 tablet (325 mg total) by mouth daily.  Dispense: 90 tablet; Refill: 1   Labs pending Health  Maintenance reviewed Diet and exercise encouraged  Follow up plan: 3 months  Lindsay Primrose, RN, BSN, FNP-Student   Mary-Margaret Hassell Done, FNP

## 2021-01-09 ENCOUNTER — Encounter: Payer: Self-pay | Admitting: Nurse Practitioner

## 2021-01-09 LAB — MICROALBUMIN / CREATININE URINE RATIO
Creatinine, Urine: 130.7 mg/dL
Microalb/Creat Ratio: 12 mg/g creat (ref 0–29)
Microalbumin, Urine: 16.3 ug/mL

## 2021-01-16 ENCOUNTER — Ambulatory Visit: Payer: Self-pay | Admitting: Surgery

## 2021-01-16 DIAGNOSIS — K4020 Bilateral inguinal hernia, without obstruction or gangrene, not specified as recurrent: Secondary | ICD-10-CM | POA: Diagnosis not present

## 2021-01-16 NOTE — H&P (Signed)
Surgical Evaluation Requesting provider: Dr. Blake Divine  Chief Complaint: inguinal hernias  HPI: this is a pleasant 75 year old woman who presents with bilateral inguinal hernias.  She first noticed these in the summer of last year.  She has worsening pain on the right side which is aggravated by standing for long periods of time.  These are reducible.  Denies any GI symptoms, she does have chronic urinary frequency and incomplete emptying.  Has a history of a bladder tack which failed within a couple of years.  States she is not interested in any further urologic surgery for this.  She does smoke about a half pack a day.    Allergies  Allergen Reactions  . Codeine Other (See Comments)    Hallucinations.    Past Medical History:  Diagnosis Date  . Anemia   . Arthritis   . Bilateral cataracts   . Essential hypertension   . GERD (gastroesophageal reflux disease)   . Heart murmur   . Hyperlipidemia   . Neuromuscular disorder ([place])   . Osteoporosis   . Type 2 diabetes mellitus ([place])     Past Surgical History:  Procedure Laterality Date  . APPENDECTOMY  1978  . BLADDER REPAIR    . CATARACT EXTRACTION W/ INTRAOCULAR LENS IMPLANT  2010 (left), 2013 (right)  . COLONOSCOPY    . EYE SURGERY    . POLYPECTOMY    . TUBAL LIGATION    . VAGINAL HYSTERECTOMY  1978    Family History  Problem Relation Age of Onset  . Stomach cancer Father   . Pancreatic cancer Father   . Liver cancer Father   . Other Sister 25       Cancer of Bile Duct  . Breast cancer Sister   . Diabetes Sister   . Brain cancer Sister 40  . Heart attack Mother   . Heart failure Mother   . Sarcoidosis Sister   . Colon cancer Neg Hx   . Esophageal cancer Neg Hx   . Rectal cancer Neg Hx     Social History   Socioeconomic History  . Marital status: Divorced    Spouse name: Not on file  . Number of children: 3  . Years of education: 20  . Highest education level: High school graduate  Occupational  History  . Occupation: Teacher, English as a foreign language, Paediatric nurse    Comment: retired  Tobacco Use  . Smoking status: Current Every Day Smoker    Packs/day: 0.50    Years: 50.00    Pack years: 25.00    Types: Cigarettes  . Smokeless tobacco: Never Used  Vaping Use  . Vaping Use: Never used  Substance and Sexual Activity  . Alcohol use: No  . Drug use: No  . Sexual activity: Not Currently  Other Topics Concern  . Not on file  Social History Narrative  . Not on file   Social Determinants of Health   Financial Resource Strain: Not on file  Food Insecurity: Not on file  Transportation Needs: Not on file  Physical Activity: Not on file  Stress: Not on file  Social Connections: Not on file    Current Outpatient Medications on File Prior to Visit  Medication Sig Dispense Refill  . aspirin 81 MG tablet Take 81 mg by mouth daily.    [name] b complex vitamins capsule Take 1 capsule by mouth daily.    . Biotin 10 MG TABS Take 4 tablets by mouth daily.    . calcium carbonate (OS-CAL)  600 MG TABS tablet Take 600 mg by mouth daily with breakfast.    . denosumab (PROLIA) 60 MG/ML SOLN injection Inject 60 mg into the skin every 6 (six) months. Administer in upper arm, thigh, or abdomen 1 mL 0  . Ferrous Sulfate (IRON) 325 (65 Fe) MG TABS Take 1 tablet (325 mg total) by mouth daily. 90 tablet 1  . gabapentin (NEURONTIN) 100 MG capsule Take 1 capsule (100 mg total) by mouth 3 (three) times daily. 270 capsule 1  . glucose blood (ONE TOUCH ULTRA TEST) test strip USE TO CHECK GLUCOSE ONCE DAILY   E11.9 100 each 9  . lisinopril (ZESTRIL) 5 MG tablet Take 1 tablet (5 mg total) by mouth daily. 90 tablet 1  . metFORMIN (GLUCOPHAGE) 500 MG tablet Take 1 tablet (500 mg total) by mouth 2 (two) times daily. 180 tablet 1  . Multiple Vitamins-Minerals (ZINC PO) Take 1 tablet by mouth daily.    [name] Lancets 89F MISC USE ONE  TO CHECK GLUCOSE ONCE DAILY Dx E11.40 100 each 3  . simvastatin (ZOCOR) 20 MG tablet  Take 1 tablet (20 mg total) by mouth daily. 90 tablet 1   No current facility-administered medications on file prior to visit.    Review of Systems: a complete, 10pt review of systems was completed with pertinent positives and negatives as documented in the HPI  Physical Exam: Weight: 139 lb Height: 63.5in Body Surface Area: 1.67 m Body Mass Index: 24.24 kg/m  Temp.: 97.84F  Pulse: 82 (Regular)  P.OX: 94% (Room air) BP: 134/60(Sitting, Left Arm, Standard) Gen: A&Ox3, no distress  Eyes: lids and conjunctivae normal, no icterus. Pupils equally round and reactive to light.  Neck: supple without mass or thyromegaly Chest: respiratory effort is normal. No crepitus or tenderness on palpation of the chest. Breath sounds equal.  Cardiovascular: RRR with palpable distal pulses, no pedal edema Gastrointestinal: soft, nondistended, nontender. No mass, hepatomegaly or splenomegaly. Bilateral reducible inguinal hernias, right greater than left Lymphatic: no lymphadenopathy in the neck or groin Muscoloskeletal: no clubbing or cyanosis of the fingers.  Strength is symmetrical throughout.  Range of motion of bilateral upper and lower extremities normal without pain, crepitation or contracture. Neuro: cranial nerves grossly intact.  Sensation intact to light touch diffusely. Psych: appropriate mood and affect, normal insight/judgment intact  Skin: warm and dry   CBC Latest Ref Rng & Units 01/08/2021 06/18/2020 03/02/2020  WBC 3.4 - 10.8 x10E3/uL 8.1 9.1 7.1  Hemoglobin 11.1 - 15.9 g/dL 13.6 12.8 13.4  Hematocrit 34.0 - 46.6 % 41.2 39.5 39.8  Platelets 150 - 450 x10E3/uL 301 305 321    CMP Latest Ref Rng & Units 01/08/2021 06/18/2020 03/02/2020  Glucose 65 - 99 mg/dL 149(H) 118(H) 126(H)  BUN 8 - 27 mg/dL 14 13 13   Creatinine 0.57 - 1.00 mg/dL 0.61 0.54(L) 0.69  Sodium 134 - 144 mmol/L 137 143 142  Potassium 3.5 - 5.2 mmol/L 4.6 4.6 4.1  Chloride 96 - 106 mmol/L 100 105 102  CO2 20 - 29  mmol/L 23 25 25   Calcium 8.7 - 10.3 mg/dL 9.0 8.7 9.7  Total Protein 6.0 - 8.5 g/dL 6.3 6.0 6.4  Total Bilirubin 0.0 - 1.2 mg/dL 0.3 0.3 0.3  Alkaline Phos 44 - 121 IU/L 56 49 62  AST 0 - 40 IU/L 16 10 14   ALT 0 - 32 IU/L 13 10 13     No results found for: INR, PROTIME  Imaging: No results found.  A/P: BILATERAL INGUINAL HERNIA WITHOUT OBSTRUCTION OR GANGRENE, RECURRENCE NOT SPECIFIED (K40.20) Story: She has significant pain from the right side on the left side is fairly asymptomatic at this point. Discussed options of open versus robotic/laparoscopic repair. We discussed the relevant anatomy and we discussed the technique of the procedures. Discussed risks of bleeding, infection, pain, scarring, injury to structures in the area including nerves, blood vessels, bowel, bladder, risk of chronic pain, hernia recurrence, risk of seroma or hematoma, urinary retention, and risks of general anesthesia including cardiovascular, pulmonary, and thromboembolic complications. Given her history of smoking her risk of hernia recurrence or wound infection is increased, but given the pain she is experiencing I believe we should proceed. As she has no interest in further urologic surgery for her bladder dysfunction, I think it would be reasonable to proceed with a robotic approach to minimize incision size and pain postoperatively. Questions were answered. Patient wishes to proceed with scheduling.    Patient Active Problem List   Diagnosis Date Noted  . Sinus congestion 11/27/2020  . Inguinal hernia of right side without obstruction or gangrene 07/12/2020  . Heart murmur 07/12/2020  . Inguinal hernia of left side without obstruction or gangrene 07/12/2020  . Gastroesophageal reflux disease without esophagitis 10/27/2016  . Diabetic neuropathy associated with type 2 diabetes mellitus ([place]) 02/12/2015  . Type 2 diabetes mellitus with complication, without long-term current use of insulin ([place]) 06/02/2013   . Hypertension 06/02/2013  . Anemia 12/16/2011  . Osteoporosis 11/26/2011  . Hyperlipidemia 11/26/2011       Romana Juniper, MD [facility] [place]  See AMION to contact appropriate on-call provider

## 2021-02-02 ENCOUNTER — Other Ambulatory Visit (HOSPITAL_COMMUNITY): Payer: Medicare HMO

## 2021-02-12 ENCOUNTER — Encounter (HOSPITAL_COMMUNITY): Payer: Self-pay

## 2021-02-12 ENCOUNTER — Encounter (HOSPITAL_COMMUNITY)
Admission: RE | Admit: 2021-02-12 | Discharge: 2021-02-12 | Disposition: A | Discharge status: Home / Self Care | Payer: Medicare HMO | Source: Ambulatory Visit | Attending: Surgery | Admitting: Surgery

## 2021-02-12 ENCOUNTER — Other Ambulatory Visit: Payer: Self-pay

## 2021-02-12 DIAGNOSIS — Z01812 Encounter for preprocedural laboratory examination: Secondary | ICD-10-CM | POA: Diagnosis present

## 2021-02-12 HISTORY — DX: Personal history of urinary calculi: Z87.442

## 2021-02-12 LAB — CBC
HCT: 39.8 % (ref 36.0–46.0)
Hemoglobin: 13.2 g/dL (ref 12.0–15.0)
MCH: 30.5 pg (ref 26.0–34.0)
MCHC: 33.2 g/dL (ref 30.0–36.0)
MCV: 91.9 fL (ref 80.0–100.0)
Platelets: 276 10*3/uL (ref 150–400)
RBC: 4.33 MIL/uL (ref 3.87–5.11)
RDW: 12.5 % (ref 11.5–15.5)
WBC: 6.7 10*3/uL (ref 4.0–10.5)
nRBC: 0 % (ref 0.0–0.2)

## 2021-02-12 LAB — BASIC METABOLIC PANEL
Anion gap: 8 (ref 5–15)
BUN: 14 mg/dL (ref 8–23)
CO2: 27 mmol/L (ref 22–32)
Calcium: 9.2 mg/dL (ref 8.9–10.3)
Chloride: 103 mmol/L (ref 98–111)
Creatinine, Ser: 0.52 mg/dL (ref 0.44–1.00)
GFR, Estimated: >60 mL/min (ref >60)
Glucose, Bld: 122 mg/dL — ABNORMAL HIGH (ref 70–99)
Potassium: 3.6 mmol/L (ref 3.5–5.1)
Sodium: 138 mmol/L (ref 135–145)

## 2021-02-12 LAB — GLUCOSE, CAPILLARY: Glucose-Capillary: 131 mg/dL — ABNORMAL HIGH (ref 70–99)

## 2021-02-12 NOTE — Patient Instructions (Signed)
DUE TO COVID-19 ONLY ONE VISITOR IS ALLOWED TO COME WITH YOU AND STAY IN THE WAITING ROOM ONLY DURING PRE OP AND PROCEDURE DAY OF SURGERY. THE 1 VISITOR  MAY VISIT WITH YOU AFTER SURGERY IN YOUR PRIVATE ROOM DURING VISITING HOURS ONLY!  YOU NEED TO HAVE A COVID 19 TEST ON_4/5______ @_1 :00 PM______, THIS TEST MUST BE DONE BEFORE SURGERY,  COVID TESTING SITE [address] [place] [address], IT IS ON THE RIGHT GOING OUT [address] APPROXIMATELY  2 MINUTES PAST [employer] ON THE RIGHT. ONCE YOUR COVID TEST IS COMPLETED,  PLEASE BEGIN THE QUARANTINE INSTRUCTIONS AS OUTLINED IN YOUR HANDOUT.                [name]    Your procedure is scheduled on: 02/22/21   Report to [facility] Main  Entrance   Report to admitting at   9:00 AM     Call this number if you have problems the morning of surgery [phone]    Remember: Do not eat food or drink liquids :After Midnight.   BRUSH YOUR TEETH MORNING OF SURGERY AND RINSE YOUR MOUTH OUT, NO CHEWING GUM CANDY OR MINTS.     Take these medicines the morning of surgery with A SIP OF WATER:Gabapentin   DO NOT TAKE ANY DIABETIC MEDICATIONS DAY OF YOUR SURGERY                               You may not have any metal on your body including hair pins and              piercings  Do not wear jewelry, make-up, lotions, powders or perfumes, deodorant             Do not wear nail polish on your fingernails.  Do not shave  48 hours prior to surgery.                Do not bring valuables to the hospital. [place].  Contacts, dentures or bridgework may not be worn into surgery.      Patients discharged the day of surgery will not be allowed to drive home.   IF YOU ARE HAVING SURGERY AND GOING HOME THE SAME DAY, YOU MUST HAVE AN ADULT TO DRIVE YOU HOME AND BE WITH YOU FOR 24 HOURS.  YOU MAY GO HOME BY TAXI OR UBER OR ORTHERWISE, BUT AN ADULT MUST ACCOMPANY YOU HOME AND  STAY WITH YOU FOR 24 HOURS.  Name and phone number of your driver:  Special Instructions: N/A              Please read over the following fact sheets you were given: _____________________________________________________________________             [facility] - Preparing for Surgery Before surgery, you can play an important role.  Because skin is not sterile, your skin needs to be as free of germs as possible.  You can reduce the number of germs on your skin by washing with CHG (chlorahexidine gluconate) soap before surgery.  CHG is an antiseptic cleaner which kills germs and bonds with the skin to continue killing germs even after washing. Please DO NOT use if you have an allergy to CHG or antibacterial soaps.  If your skin becomes reddened/irritated  stop using the CHG and inform your nurse when you arrive at Short Stay. Do not shave (including legs and underarms) for at least 48 hours prior to the first CHG shower.   Please follow these instructions carefully:  1.  Shower with CHG Soap the night before surgery and the  morning of Surgery.  2.  If you choose to wash your hair, wash your hair first as usual with your  normal  shampoo.  3.  After you shampoo, rinse your hair and body thoroughly to remove the  shampoo.                                        4.  Use CHG as you would any other liquid soap.  You can apply chg directly  to the skin and wash                       Gently with a scrungie or clean washcloth.  5.  Apply the CHG Soap to your body ONLY FROM THE NECK DOWN.   Do not use on face/ open                           Wound or open sores. Avoid contact with eyes, ears mouth and genitals (private parts).                       Wash face,  Genitals (private parts) with your normal soap.             6.  Wash thoroughly, paying special attention to the area where your surgery  will be performed.  7.  Thoroughly rinse your body with warm water from the neck down.  8.  DO NOT shower/wash  with your normal soap after using and rinsing off  the CHG Soap.            9.  Pat yourself dry with a clean towel.            10.  Wear clean pajamas.            11.  Place clean sheets on your bed the night of your first shower and do not  sleep with pets. Day of Surgery : Do not apply any lotions/deodorants the morning of surgery.  Please wear clean clothes to the hospital/surgery center.  FAILURE TO FOLLOW THESE INSTRUCTIONS MAY RESULT IN THE CANCELLATION OF YOUR SURGERY PATIENT SIGNATURE_________________________________  NURSE SIGNATURE__________________________________  ________________________________________________________________________

## 2021-02-12 NOTE — Progress Notes (Signed)
COVID Vaccine Completed:yes Date COVID Vaccine completed:01/13/20 COVID vaccine manufacturer:    [employer]      PCP - Ronnald Collum FNP Cardiologist -  Dr. Myles Gip Chest x-ray - no EKG - 10/29/20-epic Stress Test - no ECHO - 11/02/20-epic Cardiac Cath - no Pacemaker/ICD device last checked:NA  Sleep Study - no CPAP -   Fasting Blood Sugar - 106-125 Checks Blood Sugar _____ times a day once a week  Blood Thinner Instructions:ASA81/ M. Martin Aspirin Instructions:none. Pt will call her MD Last Dose:  Anesthesia review:   Patient denies shortness of breath, fever, cough and chest pain at PAT appointment yes  Patient verbalized understanding of instructions that were given to them at the PAT appointment. Patient was also instructed that they will need to review over the PAT instructions again at home before surgery.yes  Pt is able to climb 2 flights of stairs, do housework and ADLs without SOB she does smoke.

## 2021-02-13 ENCOUNTER — Ambulatory Visit
Admission: RE | Admit: 2021-02-13 | Discharge: 2021-02-13 | Disposition: A | Discharge status: Home / Self Care | Payer: Medicare HMO | Source: Ambulatory Visit | Attending: Nurse Practitioner | Admitting: Nurse Practitioner

## 2021-02-13 DIAGNOSIS — Z1231 Encounter for screening mammogram for malignant neoplasm of breast: Secondary | ICD-10-CM | POA: Diagnosis not present

## 2021-02-19 ENCOUNTER — Other Ambulatory Visit (HOSPITAL_COMMUNITY)
Admission: RE | Admit: 2021-02-19 | Discharge: 2021-02-19 | Disposition: A | Discharge status: Home / Self Care | Payer: Medicare HMO | Source: Ambulatory Visit | Attending: Surgery | Admitting: Surgery

## 2021-02-19 ENCOUNTER — Other Ambulatory Visit (HOSPITAL_COMMUNITY): Payer: Medicare HMO

## 2021-02-19 DIAGNOSIS — Z01812 Encounter for preprocedural laboratory examination: Secondary | ICD-10-CM | POA: Insufficient documentation

## 2021-02-19 DIAGNOSIS — Z20822 Contact with and (suspected) exposure to covid-19: Secondary | ICD-10-CM | POA: Insufficient documentation

## 2021-02-19 LAB — SARS CORONAVIRUS 2 (TAT 6-24 HRS): SARS Coronavirus 2: NEGATIVE

## 2021-02-22 ENCOUNTER — Ambulatory Visit (HOSPITAL_COMMUNITY)
Admission: RE | Admit: 2021-02-22 | Discharge: 2021-02-22 | Disposition: A | Discharge status: Home / Self Care | Payer: Medicare HMO | Attending: Surgery | Admitting: Surgery

## 2021-02-22 ENCOUNTER — Ambulatory Visit (HOSPITAL_COMMUNITY): Payer: Medicare HMO | Admitting: Anesthesiology

## 2021-02-22 ENCOUNTER — Encounter (HOSPITAL_COMMUNITY)
Admission: RE | Disposition: A | Discharge status: Home / Self Care | Payer: Self-pay | Source: Home / Self Care | Attending: Surgery

## 2021-02-22 ENCOUNTER — Encounter (HOSPITAL_COMMUNITY): Payer: Self-pay | Admitting: Surgery

## 2021-02-22 ENCOUNTER — Ambulatory Visit (HOSPITAL_COMMUNITY): Payer: Medicare HMO | Admitting: Physician Assistant

## 2021-02-22 DIAGNOSIS — F17210 Nicotine dependence, cigarettes, uncomplicated: Secondary | ICD-10-CM | POA: Diagnosis not present

## 2021-02-22 DIAGNOSIS — Z79899 Other long term (current) drug therapy: Secondary | ICD-10-CM | POA: Diagnosis not present

## 2021-02-22 DIAGNOSIS — Z7982 Long term (current) use of aspirin: Secondary | ICD-10-CM | POA: Insufficient documentation

## 2021-02-22 DIAGNOSIS — Z808 Family history of malignant neoplasm of other organs or systems: Secondary | ICD-10-CM | POA: Insufficient documentation

## 2021-02-22 DIAGNOSIS — Z885 Allergy status to narcotic agent status: Secondary | ICD-10-CM | POA: Insufficient documentation

## 2021-02-22 DIAGNOSIS — K4120 Bilateral femoral hernia, without obstruction or gangrene, not specified as recurrent: Secondary | ICD-10-CM | POA: Insufficient documentation

## 2021-02-22 DIAGNOSIS — Z8249 Family history of ischemic heart disease and other diseases of the circulatory system: Secondary | ICD-10-CM | POA: Insufficient documentation

## 2021-02-22 DIAGNOSIS — K4020 Bilateral inguinal hernia, without obstruction or gangrene, not specified as recurrent: Secondary | ICD-10-CM | POA: Insufficient documentation

## 2021-02-22 DIAGNOSIS — R69 Illness, unspecified: Secondary | ICD-10-CM | POA: Diagnosis not present

## 2021-02-22 DIAGNOSIS — Z7984 Long term (current) use of oral hypoglycemic drugs: Secondary | ICD-10-CM | POA: Insufficient documentation

## 2021-02-22 DIAGNOSIS — Z803 Family history of malignant neoplasm of breast: Secondary | ICD-10-CM | POA: Diagnosis not present

## 2021-02-22 DIAGNOSIS — Z833 Family history of diabetes mellitus: Secondary | ICD-10-CM | POA: Diagnosis not present

## 2021-02-22 DIAGNOSIS — E1136 Type 2 diabetes mellitus with diabetic cataract: Secondary | ICD-10-CM | POA: Insufficient documentation

## 2021-02-22 DIAGNOSIS — I10 Essential (primary) hypertension: Secondary | ICD-10-CM | POA: Diagnosis not present

## 2021-02-22 DIAGNOSIS — E119 Type 2 diabetes mellitus without complications: Secondary | ICD-10-CM | POA: Diagnosis not present

## 2021-02-22 DIAGNOSIS — Z800 Family history of malignant neoplasm of digestive organs: Secondary | ICD-10-CM | POA: Diagnosis not present

## 2021-02-22 DIAGNOSIS — E785 Hyperlipidemia, unspecified: Secondary | ICD-10-CM | POA: Diagnosis not present

## 2021-02-22 HISTORY — PX: XI ROBOTIC ASSISTED INGUINAL HERNIA REPAIR WITH MESH: SHX6706

## 2021-02-22 LAB — GLUCOSE, CAPILLARY
Glucose-Capillary: 108 mg/dL — ABNORMAL HIGH (ref 70–99)
Glucose-Capillary: 160 mg/dL — ABNORMAL HIGH (ref 70–99)

## 2021-02-22 SURGERY — REPAIR, HERNIA, INGUINAL, ROBOT-ASSISTED, LAPAROSCOPIC, USING MESH
Anesthesia: General | Site: Abdomen | Laterality: Bilateral

## 2021-02-22 MED ORDER — BUPIVACAINE LIPOSOME 1.3 % IJ SUSP
20.0000 mL | Freq: Once | INTRAMUSCULAR | Status: DC
  Filled 2021-02-22: qty 20

## 2021-02-22 MED ORDER — ROCURONIUM BROMIDE 10 MG/ML (PF) SYRINGE
PREFILLED_SYRINGE | INTRAVENOUS | Status: DC | PRN
  Administered 2021-02-22: 40 mg via INTRAVENOUS
  Administered 2021-02-22: 60 mg via INTRAVENOUS

## 2021-02-22 MED ORDER — BUPIVACAINE LIPOSOME 1.3 % IJ SUSP
INTRAMUSCULAR | Status: DC | PRN
  Administered 2021-02-22: 20 mL

## 2021-02-22 MED ORDER — CEFAZOLIN SODIUM-DEXTROSE 2-4 GM/100ML-% IV SOLN
2.0000 g | INTRAVENOUS | Status: AC
  Administered 2021-02-22: 2 g via INTRAVENOUS
  Filled 2021-02-22: qty 100

## 2021-02-22 MED ORDER — ROCURONIUM BROMIDE 10 MG/ML (PF) SYRINGE
PREFILLED_SYRINGE | INTRAVENOUS | Status: AC
  Filled 2021-02-22: qty 10

## 2021-02-22 MED ORDER — BUPIVACAINE-EPINEPHRINE 0.25% -1:200000 IJ SOLN
INTRAMUSCULAR | Status: DC | PRN
  Administered 2021-02-22: 30 mL

## 2021-02-22 MED ORDER — DEXAMETHASONE SODIUM PHOSPHATE 10 MG/ML IJ SOLN
INTRAMUSCULAR | Status: AC
  Filled 2021-02-22: qty 1

## 2021-02-22 MED ORDER — DEXAMETHASONE SODIUM PHOSPHATE 4 MG/ML IJ SOLN
INTRAMUSCULAR | Status: DC | PRN
  Administered 2021-02-22: 6 mg via INTRAVENOUS

## 2021-02-22 MED ORDER — ORAL CARE MOUTH RINSE
15.0000 mL | Freq: Once | OROMUCOSAL | Status: AC
  Administered 2021-02-22: 15 mL via OROMUCOSAL

## 2021-02-22 MED ORDER — LIDOCAINE 2% (20 MG/ML) 5 ML SYRINGE
INTRAMUSCULAR | Status: AC
  Filled 2021-02-22: qty 5

## 2021-02-22 MED ORDER — OXYCODONE HCL 5 MG PO TABS
ORAL_TABLET | ORAL | Status: AC
  Filled 2021-02-22: qty 1

## 2021-02-22 MED ORDER — LIDOCAINE 2% (20 MG/ML) 5 ML SYRINGE
INTRAMUSCULAR | Status: DC | PRN
  Administered 2021-02-22: 60 mg via INTRAVENOUS

## 2021-02-22 MED ORDER — SODIUM CHLORIDE 0.9% FLUSH: 3.0000 mL | Freq: Two times a day (BID) | INTRAVENOUS | Status: DC

## 2021-02-22 MED ORDER — SODIUM CHLORIDE 0.9 % IV SOLN: 250.0000 mL | INTRAVENOUS | Status: DC | PRN

## 2021-02-22 MED ORDER — FENTANYL CITRATE (PF) 100 MCG/2ML IJ SOLN
INTRAMUSCULAR | Status: DC | PRN
  Administered 2021-02-22 (×2): 50 ug via INTRAVENOUS

## 2021-02-22 MED ORDER — FENTANYL CITRATE (PF) 100 MCG/2ML IJ SOLN: 25.0000 ug | INTRAMUSCULAR | Status: DC | PRN

## 2021-02-22 MED ORDER — BUPIVACAINE-EPINEPHRINE (PF) 0.25% -1:200000 IJ SOLN
INTRAMUSCULAR | Status: AC
  Filled 2021-02-22: qty 30

## 2021-02-22 MED ORDER — CHLORHEXIDINE GLUCONATE CLOTH 2 % EX PADS: 6.0000 | MEDICATED_PAD | Freq: Once | CUTANEOUS | Status: DC

## 2021-02-22 MED ORDER — LACTATED RINGERS IV SOLN: INTRAVENOUS | Status: DC

## 2021-02-22 MED ORDER — ONDANSETRON HCL 4 MG/2ML IJ SOLN: 4.0000 mg | Freq: Once | INTRAMUSCULAR | Status: DC | PRN

## 2021-02-22 MED ORDER — OXYCODONE HCL 5 MG PO TABS: 2.5000 mg | ORAL_TABLET | Freq: Three times a day (TID) | ORAL | 0 refills | Status: AC | PRN

## 2021-02-22 MED ORDER — DOCUSATE SODIUM 100 MG PO CAPS: 100.0000 mg | ORAL_CAPSULE | Freq: Two times a day (BID) | ORAL | 0 refills | Status: AC

## 2021-02-22 MED ORDER — ACETAMINOPHEN 650 MG RE SUPP
650.0000 mg | RECTAL | Status: DC | PRN
  Filled 2021-02-22: qty 1

## 2021-02-22 MED ORDER — SUGAMMADEX SODIUM 200 MG/2ML IV SOLN
INTRAVENOUS | Status: DC | PRN
  Administered 2021-02-22: 200 mg via INTRAVENOUS

## 2021-02-22 MED ORDER — OXYCODONE HCL 5 MG PO TABS
5.0000 mg | ORAL_TABLET | ORAL | Status: DC | PRN
Start: 2021-02-22 — End: 2021-02-22
  Administered 2021-02-22: 5 mg via ORAL

## 2021-02-22 MED ORDER — EPHEDRINE SULFATE-NACL 50-0.9 MG/10ML-% IV SOSY
PREFILLED_SYRINGE | INTRAVENOUS | Status: DC | PRN
  Administered 2021-02-22 (×4): 5 mg via INTRAVENOUS

## 2021-02-22 MED ORDER — ONDANSETRON HCL 4 MG/2ML IJ SOLN
INTRAMUSCULAR | Status: AC
  Filled 2021-02-22: qty 2

## 2021-02-22 MED ORDER — EPHEDRINE 5 MG/ML INJ
INTRAVENOUS | Status: AC
  Filled 2021-02-22: qty 10

## 2021-02-22 MED ORDER — CHLORHEXIDINE GLUCONATE 0.12 % MT SOLN: 15.0000 mL | Freq: Once | OROMUCOSAL | Status: AC

## 2021-02-22 MED ORDER — ACETAMINOPHEN 500 MG PO TABS
1000.0000 mg | ORAL_TABLET | ORAL | Status: AC
  Administered 2021-02-22: 1000 mg via ORAL
  Filled 2021-02-22: qty 2

## 2021-02-22 MED ORDER — PROPOFOL 10 MG/ML IV BOLUS
INTRAVENOUS | Status: DC | PRN
  Administered 2021-02-22: 120 mg via INTRAVENOUS

## 2021-02-22 MED ORDER — ACETAMINOPHEN 325 MG PO TABS: 650.0000 mg | ORAL_TABLET | ORAL | Status: DC | PRN

## 2021-02-22 MED ORDER — FENTANYL CITRATE (PF) 100 MCG/2ML IJ SOLN
INTRAMUSCULAR | Status: AC
  Filled 2021-02-22: qty 2

## 2021-02-22 MED ORDER — ONDANSETRON HCL 4 MG/2ML IJ SOLN
INTRAMUSCULAR | Status: DC | PRN
  Administered 2021-02-22: 4 mg via INTRAVENOUS

## 2021-02-22 MED ORDER — SODIUM CHLORIDE 0.9% FLUSH: 3.0000 mL | INTRAVENOUS | Status: DC | PRN

## 2021-02-22 SURGICAL SUPPLY — 48 items
ADH SKN CLS APL DERMABOND .7 (GAUZE/BANDAGES/DRESSINGS) ×1
APL PRP STRL LF DISP 70% ISPRP (MISCELLANEOUS) ×1
APL SWBSTK 6 STRL LF DISP (MISCELLANEOUS) ×2
APPLICATOR COTTON TIP 6 STRL (MISCELLANEOUS) ×2 IMPLANT
APPLICATOR COTTON TIP 6IN STRL (MISCELLANEOUS) ×4
BLADE SURG SZ11 CARB STEEL (BLADE) ×2 IMPLANT
CHLORAPREP W/TINT 26 (MISCELLANEOUS) ×2 IMPLANT
COVER SURGICAL LIGHT HANDLE (MISCELLANEOUS) ×2 IMPLANT
COVER TIP SHEARS 8 DVNC (MISCELLANEOUS) ×1 IMPLANT
COVER TIP SHEARS 8MM DA VINCI (MISCELLANEOUS) ×2
COVER WAND RF STERILE (DRAPES) IMPLANT
DECANTER SPIKE VIAL GLASS SM (MISCELLANEOUS) ×2 IMPLANT
DERMABOND ADVANCED (GAUZE/BANDAGES/DRESSINGS) ×1
DERMABOND ADVANCED .7 DNX12 (GAUZE/BANDAGES/DRESSINGS) IMPLANT
DRAPE ARM DVNC X/XI (DISPOSABLE) ×4 IMPLANT
DRAPE COLUMN DVNC XI (DISPOSABLE) ×1 IMPLANT
DRAPE DA VINCI XI ARM (DISPOSABLE) ×8
DRAPE DA VINCI XI COLUMN (DISPOSABLE) ×2
ELECT REM PT RETURN 15FT ADLT (MISCELLANEOUS) ×2 IMPLANT
GLOVE SURG ENC MOIS LTX SZ6 (GLOVE) ×4 IMPLANT
GLOVE SURG UNDER LTX SZ6.5 (GLOVE) ×4 IMPLANT
GOWN STRL REUS W/TWL LRG LVL3 (GOWN DISPOSABLE) ×4 IMPLANT
GOWN STRL REUS W/TWL XL LVL3 (GOWN DISPOSABLE) ×2 IMPLANT
KIT BASIN OR (CUSTOM PROCEDURE TRAY) ×2 IMPLANT
KIT TURNOVER KIT A (KITS) ×2 IMPLANT
MESH 3DMAX 4X6 LT LRG (Mesh General) ×1 IMPLANT
MESH 3DMAX 4X6 RT LRG (Mesh General) ×1 IMPLANT
NDL INSUFFLATION 14GA 120MM (NEEDLE) IMPLANT
NEEDLE HYPO 22GX1.5 SAFETY (NEEDLE) ×2 IMPLANT
NEEDLE INSUFFLATION 14GA 120MM (NEEDLE) IMPLANT
PACK CARDIOVASCULAR III (CUSTOM PROCEDURE TRAY) ×2 IMPLANT
PAD POSITIONING PINK XL (MISCELLANEOUS) ×2 IMPLANT
SEAL CANN UNIV 5-8 DVNC XI (MISCELLANEOUS) ×3 IMPLANT
SEAL XI 5MM-8MM UNIVERSAL (MISCELLANEOUS) ×6
SET IRRIG TUBING LAPAROSCOPIC (IRRIGATION / IRRIGATOR) IMPLANT
SOL ANTI FOG 6CC (MISCELLANEOUS) ×1 IMPLANT
SOLUTION ANTI FOG 6CC (MISCELLANEOUS) ×1
SOLUTION ELECTROLUBE (MISCELLANEOUS) ×2 IMPLANT
SPONGE LAP 18X18 RF (DISPOSABLE) ×2 IMPLANT
SUT MNCRL AB 4-0 PS2 18 (SUTURE) ×2 IMPLANT
SUT VIC AB 3-0 SH 27 (SUTURE) ×4
SUT VIC AB 3-0 SH 27XBRD (SUTURE) ×2 IMPLANT
SUT VLOC 180 2-0 6IN GS21 (SUTURE) IMPLANT
SYR 10ML LL (SYRINGE) ×2 IMPLANT
SYR 20ML LL LF (SYRINGE) ×2 IMPLANT
TOWEL OR 17X26 10 PK STRL BLUE (TOWEL DISPOSABLE) ×2 IMPLANT
TOWEL OR NON WOVEN STRL DISP B (DISPOSABLE) ×2 IMPLANT
TUBING INSUFFLATION 10FT LAP (TUBING) ×2 IMPLANT

## 2021-02-22 NOTE — Transfer of Care (Signed)
Immediate Anesthesia Transfer of Care Note  Patient: Lindsay Villarreal  Procedure(s) Performed: XI ROBOTIC ASSISTED BILATERAL INGUINAL HERNIA REPAIR (Bilateral Abdomen)  Patient Location: PACU  Anesthesia Type:General  Level of Consciousness: awake  Airway & Oxygen Therapy: Patient Spontanous Breathing and Patient connected to face mask oxygen  Post-op Assessment: Report given to RN and Post -op Vital signs reviewed and stable  Post vital signs: Reviewed and stable  Last Vitals:  Vitals Value Taken Time  BP 134/58 02/22/21 1252  Temp    Pulse 72 02/22/21 1253  Resp 17 02/22/21 1253  SpO2 99 % 02/22/21 1253  Vitals shown include unvalidated device data.  Last Pain:  Vitals:   02/22/21 0758  TempSrc: Oral         Complications: No complications documented.

## 2021-02-22 NOTE — H&P (Signed)
Surgical Evaluation Requesting provider: Dr. Blake Divine  Chief Complaint: inguinal hernias  HPI: this is a pleasant 75 year old woman who presents with bilateral inguinal hernias.  She first noticed these in the summer of last year.  She has worsening pain on the right side which is aggravated by standing for long periods of time.  These are reducible.  Denies any GI symptoms, she does have chronic urinary frequency and incomplete emptying.  Has a history of a bladder tack which failed within a couple of years.  States she is not interested in any further urologic surgery for this.  She does smoke about a half pack a day.         Allergies  Allergen Reactions  . Codeine Other (See Comments)    Hallucinations.        Past Medical History:  Diagnosis Date  . Anemia   . Arthritis   . Bilateral cataracts   . Essential hypertension   . GERD (gastroesophageal reflux disease)   . Heart murmur   . Hyperlipidemia   . Neuromuscular disorder ([place])   . Osteoporosis   . Type 2 diabetes mellitus ([place])          Past Surgical History:  Procedure Laterality Date  . APPENDECTOMY  1978  . BLADDER REPAIR    . CATARACT EXTRACTION W/ INTRAOCULAR LENS IMPLANT  2010 (left), 2013 (right)  . COLONOSCOPY    . EYE SURGERY    . POLYPECTOMY    . TUBAL LIGATION    . VAGINAL HYSTERECTOMY  1978         Family History  Problem Relation Age of Onset  . Stomach cancer Father   . Pancreatic cancer Father   . Liver cancer Father   . Other Sister 3       Cancer of Bile Duct  . Breast cancer Sister   . Diabetes Sister   . Brain cancer Sister 64  . Heart attack Mother   . Heart failure Mother   . Sarcoidosis Sister   . Colon cancer Neg Hx   . Esophageal cancer Neg Hx   . Rectal cancer Neg Hx     Social History        Socioeconomic History  . Marital status: Divorced    Spouse name: Not on file  . Number of children: 3  . Years of  education: 9  . Highest education level: High school graduate  Occupational History  . Occupation: Teacher, English as a foreign language, Paediatric nurse    Comment: retired  Tobacco Use  . Smoking status: Current Every Day Smoker    Packs/day: 0.50    Years: 50.00    Pack years: 25.00    Types: Cigarettes  . Smokeless tobacco: Never Used  Vaping Use  . Vaping Use: Never used  Substance and Sexual Activity  . Alcohol use: No  . Drug use: No  . Sexual activity: Not Currently  Other Topics Concern  . Not on file  Social History Narrative  . Not on file   Social Determinants of Health   Financial Resource Strain: Not on file  Food Insecurity: Not on file  Transportation Needs: Not on file  Physical Activity: Not on file  Stress: Not on file  Social Connections: Not on file          Current Outpatient Medications on File Prior to Visit  Medication Sig Dispense Refill  . aspirin 81 MG tablet Take 81 mg by mouth daily.    [name] b  complex vitamins capsule Take 1 capsule by mouth daily.    . Biotin 10 MG TABS Take 4 tablets by mouth daily.    . calcium carbonate (OS-CAL) 600 MG TABS tablet Take 600 mg by mouth daily with breakfast.    . denosumab (PROLIA) 60 MG/ML SOLN injection Inject 60 mg into the skin every 6 (six) months. Administer in upper arm, thigh, or abdomen 1 mL 0  . Ferrous Sulfate (IRON) 325 (65 Fe) MG TABS Take 1 tablet (325 mg total) by mouth daily. 90 tablet 1  . gabapentin (NEURONTIN) 100 MG capsule Take 1 capsule (100 mg total) by mouth 3 (three) times daily. 270 capsule 1  . glucose blood (ONE TOUCH ULTRA TEST) test strip USE TO CHECK GLUCOSE ONCE DAILY   E11.9 100 each 9  . lisinopril (ZESTRIL) 5 MG tablet Take 1 tablet (5 mg total) by mouth daily. 90 tablet 1  . metFORMIN (GLUCOPHAGE) 500 MG tablet Take 1 tablet (500 mg total) by mouth 2 (two) times daily. 180 tablet 1  . Multiple Vitamins-Minerals (ZINC PO) Take 1 tablet by mouth daily.    [name]  Lancets 16X MISC USE ONE  TO CHECK GLUCOSE ONCE DAILY Dx E11.40 100 each 3  . simvastatin (ZOCOR) 20 MG tablet Take 1 tablet (20 mg total) by mouth daily. 90 tablet 1   No current facility-administered medications on file prior to visit.    Review of Systems: a complete, 10pt review of systems was completed with pertinent positives and negatives as documented in the HPI  Physical Exam: Weight: 139 lb Height: 63.5in Body Surface Area: 1.67 m Body Mass Index: 24.24 kg/m  Temp.: 97.34F  Pulse: 82 (Regular)  P.OX: 94% (Room air) BP: 134/60(Sitting, Left Arm, Standard) Gen: A&Ox3, no distress  Eyes: lids and conjunctivae normal, no icterus. Pupils equally round and reactive to light.  Neck: supple without mass or thyromegaly Chest: respiratory effort is normal. No crepitus or tenderness on palpation of the chest. Breath sounds equal.  Cardiovascular: RRR with palpable distal pulses, no pedal edema Gastrointestinal: soft, nondistended, nontender. No mass, hepatomegaly or splenomegaly. Bilateral reducible inguinal hernias, right greater than left Lymphatic: no lymphadenopathy in the neck or groin Muscoloskeletal: no clubbing or cyanosis of the fingers.  Strength is symmetrical throughout.  Range of motion of bilateral upper and lower extremities normal without pain, crepitation or contracture. Neuro: cranial nerves grossly intact.  Sensation intact to light touch diffusely. Psych: appropriate mood and affect, normal insight/judgment intact  Skin: warm and dry   CBC Latest Ref Rng & Units 01/08/2021 06/18/2020 03/02/2020  WBC 3.4 - 10.8 x10E3/uL 8.1 9.1 7.1  Hemoglobin 11.1 - 15.9 g/dL 13.6 12.8 13.4  Hematocrit 34.0 - 46.6 % 41.2 39.5 39.8  Platelets 150 - 450 x10E3/uL 301 305 321    CMP Latest Ref Rng & Units 01/08/2021 06/18/2020 03/02/2020  Glucose 65 - 99 mg/dL 149(H) 118(H) 126(H)  BUN 8 - 27 mg/dL 14 13 13   Creatinine 0.57 - 1.00 mg/dL 0.61 0.54(L) 0.69  Sodium 134 -  144 mmol/L 137 143 142  Potassium 3.5 - 5.2 mmol/L 4.6 4.6 4.1  Chloride 96 - 106 mmol/L 100 105 102  CO2 20 - 29 mmol/L 23 25 25   Calcium 8.7 - 10.3 mg/dL 9.0 8.7 9.7  Total Protein 6.0 - 8.5 g/dL 6.3 6.0 6.4  Total Bilirubin 0.0 - 1.2 mg/dL 0.3 0.3 0.3  Alkaline Phos 44 - 121 IU/L 56 49 62  AST 0 -  40 IU/L 16 10 14   ALT 0 - 32 IU/L 13 10 13     Recent Labs  No results found for: INR, PROTIME    Imaging: Imaging Results (Last 48 hours)  No results found.     A/P: BILATERAL INGUINAL HERNIA WITHOUT OBSTRUCTION OR GANGRENE, RECURRENCE NOT SPECIFIED (K40.20) Story: She has significant pain from the right side on the left side is fairly asymptomatic at this point. Discussed options of open versus robotic/laparoscopic repair. We discussed the relevant anatomy and we discussed the technique of the procedures. Discussed risks of bleeding, infection, pain, scarring, injury to structures in the area including nerves, blood vessels, bowel, bladder, risk of chronic pain, hernia recurrence, risk of seroma or hematoma, urinary retention, and risks of general anesthesia including cardiovascular, pulmonary, and thromboembolic complications. Given her history of smoking her risk of hernia recurrence or wound infection is increased, but given the pain she is experiencing I believe we should proceed. As she has no interest in further urologic surgery for her bladder dysfunction, I think it would be reasonable to proceed with a robotic approach to minimize incision size and pain postoperatively. Questions were answered. Patient wishes to proceed with scheduling.        Patient Active Problem List   Diagnosis Date Noted  . Sinus congestion 11/27/2020  . Inguinal hernia of right side without obstruction or gangrene 07/12/2020  . Heart murmur 07/12/2020  . Inguinal hernia of left side without obstruction or gangrene 07/12/2020  . Gastroesophageal reflux disease without esophagitis 10/27/2016  .  Diabetic neuropathy associated with type 2 diabetes mellitus ([place]) 02/12/2015  . Type 2 diabetes mellitus with complication, without long-term current use of insulin ([place]) 06/02/2013  . Hypertension 06/02/2013  . Anemia 12/16/2011  . Osteoporosis 11/26/2011  . Hyperlipidemia 11/26/2011       Romana Juniper, MD [facility], [place]

## 2021-02-22 NOTE — Anesthesia Preprocedure Evaluation (Addendum)
Anesthesia Evaluation  Patient identified by MRN, date of birth, ID band Patient awake    Reviewed: Allergy & Precautions, NPO status , Patient's Chart, lab work & pertinent test results  Airway Mallampati: II  TM Distance: >3 FB Neck ROM: Full    Dental  (+) Teeth Intact, Dental Advisory Given   Pulmonary Current Smoker,    breath sounds clear to auscultation       Cardiovascular hypertension,  Rhythm:Regular Rate:Normal     Neuro/Psych    GI/Hepatic   Endo/Other  diabetes  Renal/GU      Musculoskeletal   Abdominal   Peds  Hematology   Anesthesia Other Findings   Reproductive/Obstetrics                            Anesthesia Physical Anesthesia Plan  ASA: III  Anesthesia Plan: General   Post-op Pain Management:    Induction: Intravenous  PONV Risk Score and Plan: Ondansetron and Dexamethasone  Airway Management Planned: Oral ETT  Additional Equipment:   Intra-op Plan:   Post-operative Plan: Extubation in OR  Informed Consent: I have reviewed the patients History and Physical, chart, labs and discussed the procedure including the risks, benefits and alternatives for the proposed anesthesia with the patient or authorized representative who has indicated his/her understanding and acceptance.     Dental advisory given  Plan Discussed with: CRNA and Anesthesiologist  Anesthesia Plan Comments:         Anesthesia Quick Evaluation

## 2021-02-22 NOTE — Anesthesia Procedure Notes (Signed)
Procedure Name: Intubation Date/Time: 02/22/2021 10:43 AM Performed by: Lieutenant Diego, CRNA Pre-anesthesia Checklist: Patient identified, Emergency Drugs available, Suction available and Patient being monitored Patient Re-evaluated:Patient Re-evaluated prior to induction Oxygen Delivery Method: Circle system utilized Preoxygenation: Pre-oxygenation with 100% oxygen Induction Type: IV induction Ventilation: Mask ventilation without difficulty Laryngoscope Size: 2 Grade View: Grade I Tube type: Oral Tube size: 7.0 mm Number of attempts: 1 Airway Equipment and Method: Stylet Placement Confirmation: ETT inserted through vocal cords under direct vision,  positive ETCO2 and breath sounds checked- equal and bilateral Secured at: 22 cm Tube secured with: Tape Dental Injury: Teeth and Oropharynx as per pre-operative assessment

## 2021-02-22 NOTE — Anesthesia Postprocedure Evaluation (Signed)
Anesthesia Post Note  Patient: Lindsay Villarreal  Procedure(s) Performed: XI ROBOTIC ASSISTED BILATERAL INGUINAL HERNIA REPAIR (Bilateral Abdomen)     Patient location during evaluation: PACU Anesthesia Type: General Level of consciousness: awake and alert Pain management: pain level controlled Vital Signs Assessment: post-procedure vital signs reviewed and stable Respiratory status: spontaneous breathing, nonlabored ventilation, respiratory function stable and patient connected to nasal cannula oxygen Cardiovascular status: blood pressure returned to baseline and stable Postop Assessment: no apparent nausea or vomiting Anesthetic complications: no   No complications documented.  Last Vitals:  Vitals:   02/22/21 1430 02/22/21 1445  BP: (!) 125/52 (!) 108/55  Pulse: 77 73  Resp: 15 16  Temp: 36.5 C   SpO2: 98% 95%    Last Pain:  Vitals:   02/22/21 1445  TempSrc:   PainSc: 4                  Lizet Kelso COKER

## 2021-02-22 NOTE — Interval H&P Note (Signed)
History and Physical Interval Note:  02/22/2021 9:49 AM  Lindsay Villarreal  has presented today for surgery, with the diagnosis of BILATERAL INGUINL HERNIA.  The various methods of treatment have been discussed with the patient and family. After consideration of risks, benefits and other options for treatment, the patient has consented to  Procedure(s) with comments: XI [place] (Bilateral) - 2.5 HOURS as a surgical intervention.  The patient's history has been reviewed, patient examined, no change in status, stable for surgery.  I have reviewed the patient's chart and labs.  Questions were answered to the patient's satisfaction.     Mareli Antunes Rich Brave

## 2021-02-22 NOTE — Op Note (Signed)
Operative Note  Lindsay Villarreal  [id number]  [id number]  02/22/2021   Surgeon: Romana Juniper MD   Procedure performed: Robotic repair of bilateral indirect inguinal and femoral hernias   Preop diagnosis: Bilateral inguinal hernia Post-op diagnosis/intraop findings: Bilateral indirect inguinal hernias, the right side containing small bowel, bilateral femoral hernias   Specimens: No Retained items: No  EBL: Minimal cc Complications: none   Description of procedure: After obtaining informed consent the patient was taken to the operating room and placed supine on operating room table where general endotracheal anesthesia was initiated, preoperative antibiotics were administered, SCDs applied, foley inserted and a formal timeout was performed. The abdomen was prepped and draped in usual sterile fashion. Peritoneal access was gained with left subcostal Veress needle and insufflation to 15 mmHg ensued without incident. 8 millimeter periumbilical trocar and camera were inserted. The abdomen was inspected and confirmed to be free of any injury from our entry or gross abnormalities.  Bilateral laparoscopic assisted taps block with quarter percent Marcaine with epinephrine mixed with Exparel were performed.  The patient was then placed in steep Trendelenburg and the pelvis inspected.  Large bilateral indirect hernias were immediately visible, the left side containing small bowel.  Under direct visualization, bilateral 8 mm trochars were inserted after infiltration with local.  The robot was then docked and instruments inserted under direct visualization.    Beginning on the right side, the small bowel was easily reduced from the indirect hernia.  The peritoneal flap was developed using sharp and cautery dissection spanning from the anterior superior iliac spine to the medial umbilical ligament. The flap was bluntly dissected off the anterior abdominal wall until the hernia sac was reduced, the Cooper's  ligament exposed medially, femoral vessels exposed inferiorly which did reveal a small femoral hernia on the right side and sufficient room for the mesh was created inferiorly. Hemostasis was confirmed and the dissection field.  A large right-sided Bard 3D max mesh was then placed over the hernia defect where there was noted to be plenty of overlap of both the indirect and femoral space. The mesh was secured to the Cooper's ligament and superiorly on either side of the inferior epigastric vessels using simple interrupted 3-0 Vicryls. The peritoneal flap was then brought back up to completely cover the mesh and was re-opposed to the abdominal wall with a running 3-0 V lock suture in a connell fashion to imbricate and bury the suture as much as possible.  While doing this we ensured that the mesh did not fold or buckle and remained flush against the anterior abdominal wall.  The peritoneal flap was quite thin and this did tear a small amount.  The hernia sac was brought up over this and pexied to the closure with the remaining tail of the V-Loc suture in order to obliterate this small peritoneal defect.  At completion there was no exposed mesh present.   Then turned to the left side where in an identical fashion, the peritoneal flap was created using a combination of blunt and sharp dissection, the indirect hernia sac was reduced, which did require division of the round ligament on this side, Cooper's ligament exposed and the femoral vessels exposed which again revealed a femoral hernia on the left side as well.  A large left-sided Bard 3D max mesh was then placed over the hernia defect with excellent overlap of both the indirect defect in the femoral defect.  This was secured to the Cooper's ligament as well as superiorly  on either side of the inferior epigastric vessels with simple interrupted 3-0 Vicryl sutures.  The peritoneal flap was then brought up over the mesh and reapposed the abdominal wall with a running  3 OV lock suture again in a connell fashion, ensuring that the mesh did not fold or buckle and remained flush against the anterior abdominal wall.  On completion there is no exposed mesh and the peritoneum is completely closed bilaterally.  The abdomen and pelvis were inspected, confirmed to be free of injury and hemostasis was confirmed.  The abdomen was desufflated and the trochars removed.  The skin incisions were closed with subcuticular Monocryl and Dermabond. The patient was then awakened, extubated and taken to PACU in stable condition.  All counts were correct at the completion of the case.

## 2021-02-22 NOTE — Discharge Instructions (Signed)
HERNIA REPAIR: POST OP INSTRUCTIONS   EAT Gradually transition to a high fiber diet with a fiber supplement over the next few weeks after discharge.  Start with a pureed / full liquid diet (see below)  WALK Walk an hour a day (cumulative- not all at once).  Control your pain to do that.    CONTROL PAIN Control pain so that you can walk, sleep, tolerate sneezing/coughing, and go up/down stairs.  HAVE A BOWEL MOVEMENT DAILY Keep your bowels regular to avoid problems.  OK to try a laxative to override constipation.  OK to use an antidairrheal to slow down diarrhea.  Call if not better after 2 tries  CALL IF YOU HAVE PROBLEMS/CONCERNS Call if you are still struggling despite following these instructions. Call if you have concerns not answered by these instructions  ######################################################################    1. DIET: Follow a light bland diet & liquids the first 24 hours after arrival home, such as soup, liquids, starches, etc.  Be sure to drink plenty of fluids.  Quickly advance to a usual solid diet within a few days.  Avoid fast food or heavy meals as your are more likely to get nauseated or have irregular bowels.  A low-sugar, high-fiber diet for the rest of your life is ideal.   2. Take your usually prescribed home medications unless otherwise directed.  3. PAIN CONTROL: a. Pain is best controlled by a usual combination of three different methods TOGETHER: i. Ice/Heat ii. Over the counter pain medication iii. Prescription pain medication b. Most patients will experience some swelling and bruising around the hernia(s) such as the bellybutton, groins, or old incisions.  Ice packs or heating pads (30-60 minutes up to 6 times a day) will help. Use ice for the first few days to help decrease swelling and bruising, then switch to heat to help relax tight/sore spots and speed recovery.  Some people prefer to use ice alone, heat alone, alternating between ice &  heat.  Experiment to what works for you.  Swelling and bruising can take several weeks to resolve.   c. It is helpful to take an over-the-counter pain medication regularly for the first days: i. Naproxen (Aleve, etc)  Two 220mg tabs twice a day OR Ibuprofen (Advil, etc) Three 200mg tabs four times a day (every meal & bedtime) AND ii. Acetaminophen (Tylenol, etc) 325-650mg four times a day (every meal & bedtime) d. A  prescription for pain medication should be given to you upon discharge.  Take your pain medication as prescribed, IF NEEDED.  i. If you are having problems/concerns with the prescription medicine (does not control pain, nausea, vomiting, rash, itching, etc), please call us [phone] to see if we need to switch you to a different pain medicine that will work better for you and/or control your side effect better. ii. If you need a refill on your pain medication, please contact your pharmacy.  They will contact our office to request authorization. Prescriptions will not be filled after 5 pm or on week-ends.  4. Avoid getting constipated.  Between the surgery and the pain medications, it is common to experience some constipation.  Increasing fluid intake and taking a fiber supplement (such as Metamucil, Citrucel, FiberCon, MiraLax, etc) 1-2 times a day regularly will usually help prevent this problem from occurring.  A mild laxative (prune juice, Milk of Magnesia, MiraLax, etc) should be taken according to package directions if there are no bowel movements after 48 hours.    5.   Wash / shower every day, starting 2 days after surgery.  You may shower over the glue which is waterproof.  No soaking or swimming for at least 2 weeks. No rubbing, scrubbing, lotions or ointments to incisions until completely healed.  6. Glue will flake off after about 1-2 weeks.  You may leave the incision open to air.  You may replace a dressing/Band-Aid to cover an incision for comfort if you wish.  Continue to  shower over incision(s) after the dressing is off.  7. ACTIVITIES as tolerated:   a. You may resume regular (light) daily activities beginning the next day--such as daily self-care, walking, climbing stairs--gradually increasing activities as tolerated.  Control your pain so that you can walk an hour a day.  If you can walk 30 minutes without difficulty, it is safe to try more intense activity such as jogging, treadmill, bicycling, low-impact aerobics, swimming, etc. b. Refrain from the most intensive and strenuous activity such as sit-ups, heavy lifting, contact sports, etc  Refrain from any heavy lifting or straining until 6 weeks after surgery.   c. DO NOT PUSH THROUGH PAIN.  Let pain be your guide: If it hurts to do something, don't do it.  Pain is your body warning you to avoid that activity for another week until the pain goes down. d. You may drive when you are no longer taking prescription pain medication, you can comfortably wear a seatbelt, and you can safely maneuver your car and apply brakes. e. Dennis Bast may have sexual intercourse when it is comfortable.   8. FOLLOW UP in our office a. Please call [facility] at [phone] to set up an appointment to see your surgeon in the office for a follow-up appointment approximately 2-3 weeks after your surgery. b. Make sure that you call for this appointment the day you arrive home to insure a convenient appointment time.  9.  If you have disability of FMLA / Family leave forms, please bring the forms to the office for processing.  (do not give to your surgeon).  WHEN TO CALL us [phone]: 1. Poor pain control 2. Reactions / problems with new medications (rash/itching, nausea, etc)  3. Fever over 101.5 F (38.5 C) 4. Inability to urinate 5. Nausea and/or vomiting 6. Worsening swelling or bruising 7. Continued bleeding from incision. 8. Increased pain, redness, or drainage from the incision   The clinic staff is available to answer your  questions during regular business hours (8:30am-5pm).  Please don't hesitate to call and ask to speak to one of our nurses for clinical concerns.   If you have a medical emergency, go to the nearest emergency room or call 911.  A surgeon from [facility] is always on call at the hospitals in [facility], [place], [place], [place], [place]  [address] ?  [address] [address], [place], [place]   [address] MAIN: [phone] ? TOLL FREE: [phone] ? FAX: [phone] [email or web address]

## 2021-02-25 ENCOUNTER — Encounter (HOSPITAL_COMMUNITY): Payer: Self-pay | Admitting: Surgery

## 2021-03-05 DIAGNOSIS — L819 Disorder of pigmentation, unspecified: Secondary | ICD-10-CM | POA: Diagnosis not present

## 2021-03-05 DIAGNOSIS — L821 Other seborrheic keratosis: Secondary | ICD-10-CM | POA: Diagnosis not present

## 2021-03-05 DIAGNOSIS — D229 Melanocytic nevi, unspecified: Secondary | ICD-10-CM | POA: Diagnosis not present

## 2021-03-05 DIAGNOSIS — D1801 Hemangioma of skin and subcutaneous tissue: Secondary | ICD-10-CM | POA: Diagnosis not present

## 2021-03-05 DIAGNOSIS — L820 Inflamed seborrheic keratosis: Secondary | ICD-10-CM | POA: Diagnosis not present

## 2021-03-05 DIAGNOSIS — D485 Neoplasm of uncertain behavior of skin: Secondary | ICD-10-CM | POA: Diagnosis not present

## 2021-03-05 DIAGNOSIS — I8393 Asymptomatic varicose veins of bilateral lower extremities: Secondary | ICD-10-CM | POA: Diagnosis not present

## 2021-03-05 DIAGNOSIS — L814 Other melanin hyperpigmentation: Secondary | ICD-10-CM | POA: Diagnosis not present

## 2021-04-06 ENCOUNTER — Other Ambulatory Visit: Payer: Self-pay | Admitting: Nurse Practitioner

## 2021-04-06 DIAGNOSIS — E118 Type 2 diabetes mellitus with unspecified complications: Secondary | ICD-10-CM

## 2021-04-06 DIAGNOSIS — I10 Essential (primary) hypertension: Secondary | ICD-10-CM

## 2021-04-06 DIAGNOSIS — E782 Mixed hyperlipidemia: Secondary | ICD-10-CM

## 2021-04-16 ENCOUNTER — Ambulatory Visit: Payer: Medicare HMO | Admitting: Nurse Practitioner

## 2021-04-22 ENCOUNTER — Ambulatory Visit (INDEPENDENT_AMBULATORY_CARE_PROVIDER_SITE_OTHER): Payer: Medicare HMO | Admitting: Nurse Practitioner

## 2021-04-22 ENCOUNTER — Encounter: Payer: Self-pay | Admitting: Nurse Practitioner

## 2021-04-22 ENCOUNTER — Other Ambulatory Visit: Payer: Self-pay

## 2021-04-22 VITALS — BP 129/59 | HR 59 | Temp 97.6°F | Resp 20 | Ht 63.0 in | Wt 137.0 lb

## 2021-04-22 DIAGNOSIS — K219 Gastro-esophageal reflux disease without esophagitis: Secondary | ICD-10-CM | POA: Diagnosis not present

## 2021-04-22 DIAGNOSIS — R011 Cardiac murmur, unspecified: Secondary | ICD-10-CM | POA: Diagnosis not present

## 2021-04-22 DIAGNOSIS — D509 Iron deficiency anemia, unspecified: Secondary | ICD-10-CM | POA: Diagnosis not present

## 2021-04-22 DIAGNOSIS — I10 Essential (primary) hypertension: Secondary | ICD-10-CM | POA: Diagnosis not present

## 2021-04-22 DIAGNOSIS — E782 Mixed hyperlipidemia: Secondary | ICD-10-CM

## 2021-04-22 DIAGNOSIS — E1143 Type 2 diabetes mellitus with diabetic autonomic (poly)neuropathy: Secondary | ICD-10-CM | POA: Diagnosis not present

## 2021-04-22 DIAGNOSIS — M810 Age-related osteoporosis without current pathological fracture: Secondary | ICD-10-CM | POA: Diagnosis not present

## 2021-04-22 DIAGNOSIS — E118 Type 2 diabetes mellitus with unspecified complications: Secondary | ICD-10-CM | POA: Diagnosis not present

## 2021-04-22 LAB — CBC WITH DIFFERENTIAL/PLATELET
Basophils Absolute: 0.1 10*3/uL (ref 0.0–0.2)
Basos: 1 %
EOS (ABSOLUTE): 0.1 10*3/uL (ref 0.0–0.4)
Eos: 2 %
Hematocrit: 37.6 % (ref 34.0–46.6)
Hemoglobin: 12.5 g/dL (ref 11.1–15.9)
Immature Grans (Abs): 0 10*3/uL (ref 0.0–0.1)
Immature Granulocytes: 0 %
Lymphocytes Absolute: 2.7 10*3/uL (ref 0.7–3.1)
Lymphs: 37 %
MCH: 29.6 pg (ref 26.6–33.0)
MCHC: 33.2 g/dL (ref 31.5–35.7)
MCV: 89 fL (ref 79–97)
Monocytes Absolute: 0.6 10*3/uL (ref 0.1–0.9)
Monocytes: 8 %
Neutrophils Absolute: 3.8 10*3/uL (ref 1.4–7.0)
Neutrophils: 52 %
Platelets: 294 10*3/uL (ref 150–450)
RBC: 4.22 x10E6/uL (ref 3.77–5.28)
RDW: 12.7 % (ref 11.7–15.4)
WBC: 7.3 10*3/uL (ref 3.4–10.8)

## 2021-04-22 LAB — CMP14+EGFR
ALT: 9 IU/L (ref 0–32)
AST: 12 IU/L (ref 0–40)
Albumin/Globulin Ratio: 2.1 (ref 1.2–2.2)
Albumin: 3.9 g/dL (ref 3.7–4.7)
Alkaline Phosphatase: 56 IU/L (ref 44–121)
BUN/Creatinine Ratio: 16 (ref 12–28)
BUN: 11 mg/dL (ref 8–27)
Bilirubin Total: 0.2 mg/dL (ref 0.0–1.2)
CO2: 24 mmol/L (ref 20–29)
Calcium: 9.4 mg/dL (ref 8.7–10.3)
Chloride: 103 mmol/L (ref 96–106)
Creatinine, Ser: 0.7 mg/dL (ref 0.57–1.00)
Globulin, Total: 1.9 g/dL (ref 1.5–4.5)
Glucose: 110 mg/dL — ABNORMAL HIGH (ref 65–99)
Potassium: 4.4 mmol/L (ref 3.5–5.2)
Sodium: 140 mmol/L (ref 134–144)
Total Protein: 5.8 g/dL — ABNORMAL LOW (ref 6.0–8.5)
eGFR: 90 mL/min/{1.73_m2} (ref >59)

## 2021-04-22 LAB — LIPID PANEL
Chol/HDL Ratio: 2.9 ratio (ref 0.0–4.4)
Cholesterol, Total: 116 mg/dL (ref 100–199)
HDL: 40 mg/dL (ref >39)
LDL Chol Calc (NIH): 55 mg/dL (ref 0–99)
Triglycerides: 118 mg/dL (ref 0–149)
VLDL Cholesterol Cal: 21 mg/dL (ref 5–40)

## 2021-04-22 LAB — BAYER DCA HB A1C WAIVED: HB A1C (BAYER DCA - WAIVED): 6.2 % (ref <7.0)

## 2021-04-22 MED ORDER — IRON 325 (65 FE) MG PO TABS: 1.0000 | ORAL_TABLET | Freq: Every day | ORAL | 1 refills | Status: DC

## 2021-04-22 MED ORDER — GABAPENTIN 100 MG PO CAPS: 100.0000 mg | ORAL_CAPSULE | Freq: Three times a day (TID) | ORAL | 1 refills | Status: DC

## 2021-04-22 MED ORDER — METFORMIN HCL 500 MG PO TABS
1.0000 | ORAL_TABLET | Freq: Two times a day (BID) | ORAL | 1 refills | Status: DC
Start: 2021-04-22 — End: 2022-01-06

## 2021-04-22 MED ORDER — SIMVASTATIN 20 MG PO TABS: 20.0000 mg | ORAL_TABLET | Freq: Every day | ORAL | 1 refills | Status: DC

## 2021-04-22 NOTE — Patient Instructions (Signed)

## 2021-04-22 NOTE — Progress Notes (Signed)
Subjective:    Patient ID: Lindsay Villarreal, female    DOB: 08-Nov-1946, 75 y.o.   MRN: [id number]   Chief Complaint: Medical Management of Chronic Issues    HPI:  1. Primary hypertension No c/o chest pain, sob or headache. Does not check blood pressure at home. BP Readings from Last 3 Encounters:  04/22/21 (!) 129/59  02/22/21 (!) 108/55  02/12/21 120/66     2. Mixed hyperlipidemia Does not watch diet and does little to no dedicated exercise. Lab Results  Component Value Date   CHOL 113 01/08/2021   HDL 42 01/08/2021   LDLCALC 48 01/08/2021   TRIG 127 01/08/2021   CHOLHDL 2.7 01/08/2021     3. Type 2 diabetes mellitus with complication, without long-term current use of insulin (HCC) Fasting blood sugars are runnning below 120. She denies any low blood sugars. Lab Results  Component Value Date   HGBA1C 6.4 01/08/2021     4. Diabetic autonomic neuropathy associated with type 2 diabetes mellitus (HCC) Has constant burning in bil feet with some pain if she is on them a lot. She is on neurontin which works well for her.  5. Gastroesophageal reflux disease without esophagitis She only uses OTC meds when needed. She does not need very often  6. Heart murmur No problems. She has had for years  7. Age-related osteoporosis without current pathological fracture Is on prolia injections. Last dexascan was done 12/02/19 with t score of -1.0.  8. Iron deficiency anemia, unspecified iron deficiency anemia type Takes a multivitamin daily with iron , calcium and vitamind    Outpatient Encounter Medications as of 04/22/2021  Medication Sig  . aspirin 81 MG tablet Take 81 mg by mouth at bedtime.  [name] b complex vitamins capsule Take 1 capsule by mouth daily.  . calcium carbonate (OSCAL) 1500 (600 Ca) MG TABS tablet Take 600 mg of elemental calcium by mouth daily with breakfast.  . cholecalciferol (VITAMIN D3) 25 MCG (1000 UNIT) tablet Take 1,000 Units by mouth daily.  [name] denosumab  (PROLIA) 60 MG/ML SOLN injection Inject 60 mg into the skin every 6 (six) months. Administer in upper arm, thigh, or abdomen  . Ferrous Sulfate (IRON) 325 (65 Fe) MG TABS Take 1 tablet (325 mg total) by mouth daily.  [name] gabapentin (NEURONTIN) 100 MG capsule Take 1 capsule (100 mg total) by mouth 3 (three) times daily.  [name] glucose blood (ONE TOUCH ULTRA TEST) test strip USE TO CHECK GLUCOSE ONCE DAILY   E11.9  . lisinopril (ZESTRIL) 5 MG tablet Take 1 tablet by mouth once daily  . metFORMIN (GLUCOPHAGE) 500 MG tablet Take 1 tablet by mouth twice daily  . Multiple Vitamins-Minerals (ZINC PO) Take 1 tablet by mouth daily.  [name] Lancets 14H MISC USE ONE  TO CHECK GLUCOSE ONCE DAILY Dx E11.40  . polyvinyl alcohol (LIQUIFILM TEARS) 1.4 % ophthalmic solution Place 1 drop into both eyes as needed for dry eyes.  . simvastatin (ZOCOR) 20 MG tablet Take 1 tablet by mouth once daily   No facility-administered encounter medications on file as of 04/22/2021.    Past Surgical History:  Procedure Laterality Date  . APPENDECTOMY  1978  . BLADDER REPAIR    . CATARACT EXTRACTION W/ INTRAOCULAR LENS IMPLANT  2010 (left), 2013 (right)  . COLONOSCOPY    . EYE SURGERY    . POLYPECTOMY    . TUBAL LIGATION    . VAGINAL HYSTERECTOMY  1978  . XI  ROBOTIC ASSISTED INGUINAL HERNIA REPAIR WITH MESH Bilateral 02/22/2021   Procedure: XI ROBOTIC ASSISTED BILATERAL INGUINAL HERNIA REPAIR;  Surgeon: Clovis Riley, MD;  Location: [facility];  Service: General;  Laterality: Bilateral;  2.5 HOURS    Family History  Problem Relation Age of Onset  . Stomach cancer Father   . Pancreatic cancer Father   . Liver cancer Father   . Other Sister 29       Cancer of Bile Duct  . Breast cancer Sister   . Diabetes Sister   . Brain cancer Sister 76  . Heart attack Mother   . Heart failure Mother   . Sarcoidosis Sister   . Colon cancer Neg Hx   . Esophageal cancer Neg Hx   . Rectal cancer Neg Hx     New  complaints: None todat  Social history: Lives by herself  Controlled substance contract: n/a    Review of Systems  Constitutional: Negative for diaphoresis.  Eyes: Negative for pain.  Respiratory: Negative for shortness of breath.   Cardiovascular: Negative for chest pain, palpitations and leg swelling.  Gastrointestinal: Negative for abdominal pain.  Endocrine: Negative for polydipsia.  Skin: Negative for rash.  Neurological: Negative for dizziness, weakness and headaches.  Hematological: Does not bruise/bleed easily.  All other systems reviewed and are negative.      Objective:   Physical Exam Vitals and nursing note reviewed.  Constitutional:      General: She is not in acute distress.    Appearance: Normal appearance. She is well-developed.  HENT:     Head: Normocephalic.     Nose: Nose normal.  Eyes:     Pupils: Pupils are equal, round, and reactive to light.  Neck:     Vascular: No carotid bruit or JVD.  Cardiovascular:     Rate and Rhythm: Normal rate and regular rhythm.     Heart sounds: Normal heart sounds.  Pulmonary:     Effort: Pulmonary effort is normal. No respiratory distress.     Breath sounds: Normal breath sounds. No wheezing or rales.  Chest:     Chest wall: No tenderness.  Abdominal:     General: Bowel sounds are normal. There is no distension or abdominal bruit.     Palpations: Abdomen is soft. There is no hepatomegaly, splenomegaly, mass or pulsatile mass.     Tenderness: There is no abdominal tenderness.  Musculoskeletal:        General: Normal range of motion.     Cervical back: Normal range of motion and neck supple.  Lymphadenopathy:     Cervical: No cervical adenopathy.  Skin:    General: Skin is warm and dry.  Neurological:     Mental Status: She is alert and oriented to person, place, and time.     Deep Tendon Reflexes: Reflexes are normal and symmetric.  Psychiatric:        Behavior: Behavior normal.        Thought Content:  Thought content normal.        Judgment: Judgment normal.    BP (!) 129/59   Pulse (!) 59   Temp 97.6 F (36.4 C) (Temporal)   Resp 20   Ht _0  (1.6 m)   Wt 137 lb (62.1 kg)   SpO2 98%   BMI 24.27 kg/m   hgba1c 6.2%     Assessment & Plan:  Lindsay Villarreal comes in today with chief complaint of Medical Management of Chronic Issues  Diagnosis and orders addressed:  1. Primary hypertension Low sodium dier - CBC with Differential/Platelet - CMP14+EGFR  2. Mixed hyperlipidemia Low fat diet - Lipid panet - simvastatin (ZOCOR) 20 MG tablet; Take 1 tablet (20 mg total) by mouth daily.  Dispense: 90 tablet; Refill: 1  3. Type 2 diabetes mellitus with complication, without long-term current use of insulin (HCC) Continue to wtach carbsin diet - Bayer DCA Hb A1c Waived - metFORMIN (GLUCOPHAGE) 500 MG tablet; Take 1 tablet (500 mg total) by mouth 2 (two) times daily.  Dispense: 180 tablet; Refill: 1  4. Diabetic autonomic neuropathy associated with type 2 diabetes mellitus ([place]) Do not go barefooted - gabapentin (NEURONTIN) 100 MG capsule; Take 1 capsule (100 mg total) by mouth 3 (three) times daily.  Dispense: 270 capsule; Refill: 1  5. Gastroesophageal reflux disease without esophagitis Avoid spicy foods Do not eat 2 hours prior to bedtime  6. Heart murmur  7. Age-related osteoporosis without current pathological fracture Weight bearing exercises  8. Iron deficiency anemia, unspecified iron deficiency anemia type - Ferrous Sulfate (IRON) 325 (65 Fe) MG TABS; Take 1 tablet (325 mg total) by mouth daily.  Dispense: 90 tablet; Refill: 1   Labs pending Health Maintenance reviewed Diet and exercise encouraged  Follow up plan: 6 months   Mary-Margaret Hassell Done, FNP

## 2021-04-24 ENCOUNTER — Telehealth: Payer: Self-pay | Admitting: *Deleted

## 2021-04-24 NOTE — Telephone Encounter (Signed)
Left message for patient to call back to schedule prolia injection. Patient is due after 05/15/21. Prolia is in refrigerator.

## 2021-05-16 ENCOUNTER — Other Ambulatory Visit: Payer: Self-pay

## 2021-05-16 ENCOUNTER — Ambulatory Visit (INDEPENDENT_AMBULATORY_CARE_PROVIDER_SITE_OTHER): Payer: Medicare HMO | Admitting: *Deleted

## 2021-05-16 DIAGNOSIS — M810 Age-related osteoporosis without current pathological fracture: Secondary | ICD-10-CM

## 2021-05-16 MED ORDER — DENOSUMAB 60 MG/ML ~~LOC~~ SOSY
60.0000 mg | PREFILLED_SYRINGE | Freq: Once | SUBCUTANEOUS | Status: AC
  Administered 2021-05-16: 60 mg via SUBCUTANEOUS

## 2021-05-24 ENCOUNTER — Encounter: Payer: Self-pay | Admitting: Internal Medicine

## 2021-06-10 ENCOUNTER — Other Ambulatory Visit: Payer: Self-pay | Admitting: Nurse Practitioner

## 2021-06-20 ENCOUNTER — Other Ambulatory Visit: Payer: Self-pay | Admitting: Nurse Practitioner

## 2021-06-20 DIAGNOSIS — I10 Essential (primary) hypertension: Secondary | ICD-10-CM

## 2021-08-07 ENCOUNTER — Encounter: Payer: Self-pay | Admitting: Internal Medicine

## 2021-08-07 ENCOUNTER — Ambulatory Visit (AMBULATORY_SURGERY_CENTER): Payer: Medicare HMO | Admitting: *Deleted

## 2021-08-07 ENCOUNTER — Other Ambulatory Visit: Payer: Self-pay

## 2021-08-07 VITALS — Ht 63.0 in | Wt 130.0 lb

## 2021-08-07 DIAGNOSIS — Z86010 Personal history of colonic polyps: Secondary | ICD-10-CM

## 2021-08-07 MED ORDER — SUTAB 1479-225-188 MG PO TABS: 1.0000 | ORAL_TABLET | ORAL | 0 refills | Status: DC

## 2021-08-07 NOTE — Progress Notes (Signed)
Patient's pre-visit was done today over the phone with the patient due to COVID-19 pandemic. Name,DOB and address verified. Patient denies any allergies to Eggs and Soy. Patient denies any problems with anesthesia/sedation. Patient is not taking any diet pills or blood thinners. No home Oxygen. Packet of Prep instructions mailed to patient including a copy of a consent form & coupon sutab-pt is aware. Patient understands to call us back with any questions or concerns. Patient is aware of our care-partner policy and AJOIN-86 safety protocol.   EMMI education assigned to the patient for the procedure, sent to [place].   The patient is COVID-19 vaccinated.

## 2021-08-12 ENCOUNTER — Telehealth: Payer: Medicare HMO | Admitting: Internal Medicine

## 2021-08-12 DIAGNOSIS — Z86010 Personal history of colonic polyps: Secondary | ICD-10-CM

## 2021-08-12 MED ORDER — PEG 3350-KCL-NA BICARB-NACL 420 G PO SOLR: 4000.0000 mL | Freq: Once | ORAL | 0 refills | Status: AC

## 2021-08-12 NOTE — Telephone Encounter (Signed)
Pt called- states she wants a liquids prep that is covered by her insurance- I discussed prep options for her today- she does not want to pay $60 for the Plenvu - Suprep is not covered- Golytely is the only prep covered by [employer]- pt states she wants the cheapest prep   Sent in [place] to her [facility] New prep instructions to pt via mail  We discussed Golytely prep today over the phone

## 2021-08-12 NOTE — Telephone Encounter (Signed)
Inbound call from patient requesting to change Sutab medication to a liquid form instead.  Please advise.

## 2021-09-02 ENCOUNTER — Encounter: Payer: Self-pay | Admitting: Internal Medicine

## 2021-09-02 ENCOUNTER — Ambulatory Visit (AMBULATORY_SURGERY_CENTER): Payer: Medicare HMO | Admitting: Internal Medicine

## 2021-09-02 ENCOUNTER — Other Ambulatory Visit: Payer: Self-pay

## 2021-09-02 VITALS — BP 125/48 | HR 68 | Temp 98.4°F | Resp 19 | Ht 63.0 in | Wt 130.0 lb

## 2021-09-02 DIAGNOSIS — D123 Benign neoplasm of transverse colon: Secondary | ICD-10-CM | POA: Diagnosis not present

## 2021-09-02 DIAGNOSIS — Z86010 Personal history of colonic polyps: Secondary | ICD-10-CM | POA: Diagnosis not present

## 2021-09-02 DIAGNOSIS — D120 Benign neoplasm of cecum: Secondary | ICD-10-CM

## 2021-09-02 MED ORDER — SODIUM CHLORIDE 0.9 % IV SOLN: 500.0000 mL | Freq: Once | INTRAVENOUS | Status: DC

## 2021-09-02 NOTE — Progress Notes (Signed)
HISTORY OF PRESENT ILLNESS:  Lindsay Villarreal is a 75 y.o. female with a history of multiple adenomatous colon polyps who presents today for surveillance colonoscopy.  No active complaints.  REVIEW OF SYSTEMS:  All non-GI ROS negative except for  Past Medical History:  Diagnosis Date   Anemia    Po iron   Arthritis    Bilateral cataracts    Essential hypertension    GERD (gastroesophageal reflux disease)    Heart murmur    History of kidney stones    Hyperlipidemia    Neuromuscular disorder ([place])    Osteoporosis    Type 2 diabetes mellitus ([place])     Past Surgical History:  Procedure Laterality Date   APPENDECTOMY  11/17/1976   BLADDER REPAIR     CATARACT EXTRACTION W/ INTRAOCULAR LENS IMPLANT  2010 (left), 2013 (right)   COLONOSCOPY  04/30/2015   Dr.Dat Derksen   COLONOSCOPY  2013   EYE SURGERY     POLYPECTOMY     TUBAL LIGATION     VAGINAL HYSTERECTOMY  11/17/1976   XI ROBOTIC ASSISTED INGUINAL HERNIA REPAIR WITH MESH Bilateral 02/22/2021   Procedure: XI ROBOTIC ASSISTED BILATERAL INGUINAL HERNIA REPAIR;  Surgeon: Clovis Riley, MD;  Location: [facility];  Service: General;  Laterality: Bilateral;  2.5 HOURS    Social History [name]  reports that she has been smoking cigarettes. She has a 25.00 pack-year smoking history. She has never used smokeless tobacco. She reports that she does not drink alcohol and does not use drugs.  family history includes Brain cancer (age of onset: 66) in her sister; Breast cancer in her sister; Diabetes in her sister; Heart attack in her mother; Heart failure in her mother; Liver cancer in her father; Other (age of onset: 52) in her sister; Pancreatic cancer in her father; Sarcoidosis in her sister; Stomach cancer in her father.  Allergies  Allergen Reactions   Codeine Other (See Comments)    Hallucinations.       PHYSICAL EXAMINATION:  Vital signs: BP 122/64   Pulse 71   Temp 98.4 F (36.9 C) (Temporal)   Ht 5\' 3"  (1.6 m)    Wt 130 lb (59 kg)   SpO2 99%   BMI 23.03 kg/m  General: Well-developed, well-nourished, no acute distress HEENT: Sclerae are anicteric, conjunctiva pink. Oral mucosa intact Lungs: Clear Heart: Regular Abdomen: soft, nontender, nondistended, no obvious ascites, no peritoneal signs, normal bowel sounds. No organomegaly. Extremities: No edema Psychiatric: alert and oriented x3. Cooperative     ASSESSMENT:  1.  History of multiple adenomatous colon polyps.  Due for surveillance   PLAN:  1.  Surveillance colonoscopy

## 2021-09-02 NOTE — Patient Instructions (Signed)
Resume previous diet and continue present medications. Awaiting pathology results. Repeat colonoscopy in 5 years for surveillance if polyps adenomatous, otherwise as needed.  Metamucil 2 tablespoons daily to improve bowel consistency, hemorrhoidal issues, and fecal smearing.  YOU HAD AN ENDOSCOPIC PROCEDURE TODAY AT [facility]:   Refer to the procedure report that was given to you for any specific questions about what was found during the examination.  If the procedure report does not answer your questions, please call your gastroenterologist to clarify.  If you requested that your care partner not be given the details of your procedure findings, then the procedure report has been included in a sealed envelope for you to review at your convenience later.  YOU SHOULD EXPECT: Some feelings of bloating in the abdomen. Passage of more gas than usual.  Walking can help get rid of the air that was put into your GI tract during the procedure and reduce the bloating. If you had a lower endoscopy (such as a colonoscopy or flexible sigmoidoscopy) you may notice spotting of blood in your stool or on the toilet paper. If you underwent a bowel prep for your procedure, you may not have a normal bowel movement for a few days.  Please Note:  You might notice some irritation and congestion in your nose or some drainage.  This is from the oxygen used during your procedure.  There is no need for concern and it should clear up in a day or so.  SYMPTOMS TO REPORT IMMEDIATELY:  Following lower endoscopy (colonoscopy or flexible sigmoidoscopy):  Excessive amounts of blood in the stool  Significant tenderness or worsening of abdominal pains  Swelling of the abdomen that is new, acute  For urgent or emergent issues, a gastroenterologist can be reached at any hour by calling [phone]. Do not use MyChart messaging for urgent concerns.    DIET:  We do recommend a small meal at first, but then you may  proceed to your regular diet.  Drink plenty of fluids but you should avoid alcoholic beverages for 24 hours.  ACTIVITY:  You should plan to take it easy for the rest of today and you should NOT DRIVE or use heavy machinery until tomorrow (because of the sedation medicines used during the test).    FOLLOW UP: Our staff will call the number listed on your records 48-72 hours following your procedure to check on you and address any questions or concerns that you may have regarding the information given to you following your procedure. If we do not reach you, we will leave a message.  We will attempt to reach you two times.  During this call, we will ask if you have developed any symptoms of COVID 19. If you develop any symptoms (ie: fever, flu-like symptoms, shortness of breath, cough etc.) before then, please call [phone].  If you test positive for Covid 19 in the 2 weeks post procedure, please call and report this information to [place].    If any biopsies were taken you will be contacted by phone or by letter within the next 1-3 weeks.  Please call us at [phone] if you have not heard about the biopsies in 3 weeks.    SIGNATURES/CONFIDENTIALITY: You and/or your care partner have signed paperwork which will be entered into your electronic medical record.  These signatures attest to the fact that that the information above on your After Visit Summary has been reviewed and is understood.  Full responsibility of  the confidentiality of this discharge information lies with you and/or your care-partner.

## 2021-09-02 NOTE — Progress Notes (Signed)
Sedate, gd SR, tolerated procedure well, VSS, report to RN 

## 2021-09-02 NOTE — Progress Notes (Signed)
Called to room to assist during endoscopic procedure.  Patient ID and intended procedure confirmed with present staff. Received instructions for my participation in the procedure from the performing physician.  

## 2021-09-02 NOTE — Progress Notes (Signed)
Pt's states no medical or surgical changes since previsit or office visit. 

## 2021-09-02 NOTE — Op Note (Signed)
[facility] Patient Name: Lindsay Villarreal Procedure Date: 09/02/2021 9:20 AM MRN: [id number] Endoscopist: Docia Chuck. Henrene Pastor , MD Age: 75 Referring MD:  Date of Birth: July 18, 1946 Gender: Female Account #: [id number] Procedure:                Colonoscopy with cold snare polypectomy x 3 Indications:              High risk colon cancer surveillance: Personal                            history of multiple (3 or more) adenomas. Previous                            examinations 2003, 2013, 2016 Medicines:                Monitored Anesthesia Care Procedure:                Pre-Anesthesia Assessment:                           - Prior to the procedure, a History and Physical                            was performed, and patient medications and                            allergies were reviewed. The patient's tolerance of                            previous anesthesia was also reviewed. The risks                            and benefits of the procedure and the sedation                            options and risks were discussed with the patient.                            All questions were answered, and informed consent                            was obtained. Prior Anticoagulants: The patient has                            taken no previous anticoagulant or antiplatelet                            agents. ASA Grade Assessment: II - A patient with                            mild systemic disease. After reviewing the risks                            and benefits, the patient was deemed in  satisfactory condition to undergo the procedure.                           After obtaining informed consent, the colonoscope                            was passed under direct vision. Throughout the                            procedure, the patient's blood pressure, pulse, and                            oxygen saturations were monitored continuously. The                             Olympus PCF-H190DL (#[id number]) Colonoscope was                            introduced through the anus and advanced to the the                            cecum, identified by appendiceal orifice and                            ileocecal valve. The ileocecal valve, appendiceal                            orifice, and rectum were photographed. The quality                            of the bowel preparation was excellent. The                            colonoscopy was performed without difficulty. The                            patient tolerated the procedure well. The bowel                            preparation used was SUPREP via split dose                            instruction. Scope In: 9:44:43 AM Scope Out: 9:59:08 AM Scope Withdrawal Time: 0 hours 11 minutes 38 seconds  Total Procedure Duration: 0 hours 14 minutes 25 seconds  Findings:                 Three polyps were found in the transverse colon and                            cecum. The polyps were 3 mm in size. These polyps                            were removed with a cold snare. Resection and  retrieval were complete.                           Multiple diverticula were found in the left colon                            and right colon. Incidental lipoma in the cecal                            region. Large cecal diverticulum.                           External and internal hemorrhoids were found during                            retroflexion.                           The exam was otherwise without abnormality on                            direct and retroflexion views. Complications:            No immediate complications. Estimated blood loss:                            None. Estimated Blood Loss:     Estimated blood loss: none. Impression:               - Three 3 mm polyps in the transverse colon and in                            the cecum, removed with a cold snare. Resected and                             retrieved.                           - Diverticulosis in the left colon and in the right                            colon.                           - External and internal hemorrhoids.                           - The examination was otherwise normal on direct                            and retroflexion views. Recommendation:           - Repeat colonoscopy in 5 years for surveillance to                            be considered if polyps adenomatous, otherwise as  needed.                           - Patient has a contact number available for                            emergencies. The signs and symptoms of potential                            delayed complications were discussed with the                            patient. Return to normal activities tomorrow.                            Written discharge instructions were provided to the                            patient.                           - Resume previous diet.                           - Continue present medications.                           - Await pathology results.                           - Metamucil 2 tablespoons daily to improve bowel                            consistency, hemorrhoidal issues, and fecal smearing Onna Nodal N. Henrene Pastor, MD 09/02/2021 10:05:24 AM This report has been signed electronically.

## 2021-09-02 NOTE — Progress Notes (Signed)
0938 IV infiltrated, re-started without complication

## 2021-09-03 ENCOUNTER — Ambulatory Visit (INDEPENDENT_AMBULATORY_CARE_PROVIDER_SITE_OTHER): Payer: Medicare HMO

## 2021-09-03 VITALS — Ht 63.0 in | Wt 130.0 lb

## 2021-09-03 DIAGNOSIS — Z0001 Encounter for general adult medical examination with abnormal findings: Secondary | ICD-10-CM

## 2021-09-03 DIAGNOSIS — R69 Illness, unspecified: Secondary | ICD-10-CM | POA: Diagnosis not present

## 2021-09-03 DIAGNOSIS — F17210 Nicotine dependence, cigarettes, uncomplicated: Secondary | ICD-10-CM

## 2021-09-03 DIAGNOSIS — Z0000 Encounter for general adult medical examination without abnormal findings: Secondary | ICD-10-CM

## 2021-09-03 NOTE — Patient Instructions (Signed)
Ms. Lindsay Villarreal , Thank you for taking time to come for your Medicare Wellness Visit. I appreciate your ongoing commitment to your health goals. Please review the following plan we discussed and let me know if I can assist you in the future.   Screening recommendations/referrals: Colonoscopy: Done 09/02/2021 - Repeat in 5 years Mammogram: Done 02/13/2021 -Repeat annually Bone Density: Done 12/02/2019 - Repeat every 2 years  Recommended yearly ophthalmology/optometry visit for glaucoma screening and checkup Recommended yearly dental visit for hygiene and checkup  Vaccinations: Influenza vaccine: Due Pneumococcal vaccine: Done 07/30/2012 & 10/30/2014 Tdap vaccine: Done 07/30/2012 - Repeat in 10 years Shingles vaccine: Done 08/07/2018 & 11/13/2018   Covid-19: Done 11/19/2019 & 01/13/2020 - declines boosters  Advanced directives: Advance directive discussed with you today. Even though you declined this today, please call our office should you change your mind, and we can give you the proper paperwork for you to fill out.   Conditions/risks identified: Aim for 30 minutes of exercise or brisk walking each day, drink 6-8 glasses of water and eat lots of fruits and vegetables.   Next appointment: Follow up in one year for your annual wellness visit    Preventive Care 65 Years and Older, Female Preventive care refers to lifestyle choices and visits with your health care provider that can promote health and wellness. What does preventive care include? A yearly physical exam. This is also called an annual well check. Dental exams once or twice a year. Routine eye exams. Ask your health care provider how often you should have your eyes checked. Personal lifestyle choices, including: Daily care of your teeth and gums. Regular physical activity. Eating a healthy diet. Avoiding tobacco and drug use. Limiting alcohol use. Practicing safe sex. Taking low-dose aspirin every day. Taking vitamin and mineral  supplements as recommended by your health care provider. What happens during an annual well check? The services and screenings done by your health care provider during your annual well check will depend on your age, overall health, lifestyle risk factors, and family history of disease. Counseling  Your health care provider may ask you questions about your: Alcohol use. Tobacco use. Drug use. Emotional well-being. Home and relationship well-being. Sexual activity. Eating habits. History of falls. Memory and ability to understand (cognition). Work and work Statistician. Reproductive health. Screening  You may have the following tests or measurements: Height, weight, and BMI. Blood pressure. Lipid and cholesterol levels. These may be checked every 5 years, or more frequently if you are over 42 years old. Skin check. Lung cancer screening. You may have this screening every year starting at age 6 if you have a 30-pack-year history of smoking and currently smoke or have quit within the past 15 years. Fecal occult blood test (FOBT) of the stool. You may have this test every year starting at age 37. Flexible sigmoidoscopy or colonoscopy. You may have a sigmoidoscopy every 5 years or a colonoscopy every 10 years starting at age 68. Hepatitis C blood test. Hepatitis B blood test. Sexually transmitted disease (STD) testing. Diabetes screening. This is done by checking your blood sugar (glucose) after you have not eaten for a while (fasting). You may have this done every 1-3 years. Bone density scan. This is done to screen for osteoporosis. You may have this done starting at age 15. Mammogram. This may be done every 1-2 years. Talk to your health care provider about how often you should have regular mammograms. Talk with your health care provider about your  test results, treatment options, and if necessary, the need for more tests. Vaccines  Your health care provider may recommend certain  vaccines, such as: Influenza vaccine. This is recommended every year. Tetanus, diphtheria, and acellular pertussis (Tdap, Td) vaccine. You may need a Td booster every 10 years. Zoster vaccine. You may need this after age 64. Pneumococcal 13-valent conjugate (PCV13) vaccine. One dose is recommended after age 20. Pneumococcal polysaccharide (PPSV23) vaccine. One dose is recommended after age 34. Talk to your health care provider about which screenings and vaccines you need and how often you need them. This information is not intended to replace advice given to you by your health care provider. Make sure you discuss any questions you have with your health care provider. Document Released: 11/30/2015 Document Revised: 07/23/2016 Document Reviewed: 09/04/2015 Elsevier Interactive Patient Education  2017 [place] Prevention in the Home Falls can cause injuries. They can happen to people of all ages. There are many things you can do to make your home safe and to help prevent falls. What can I do on the outside of my home? Regularly fix the edges of walkways and driveways and fix any cracks. Remove anything that might make you trip as you walk through a door, such as a raised step or threshold. Trim any bushes or trees on the path to your home. Use bright outdoor lighting. Clear any walking paths of anything that might make someone trip, such as rocks or tools. Regularly check to see if handrails are loose or broken. Make sure that both sides of any steps have handrails. Any raised decks and porches should have guardrails on the edges. Have any leaves, snow, or ice cleared regularly. Use sand or salt on walking paths during winter. Clean up any spills in your garage right away. This includes oil or grease spills. What can I do in the bathroom? Use night lights. Install grab bars by the toilet and in the tub and shower. Do not use towel bars as grab bars. Use non-skid mats or decals in  the tub or shower. If you need to sit down in the shower, use a plastic, non-slip stool. Keep the floor dry. Clean up any water that spills on the floor as soon as it happens. Remove soap buildup in the tub or shower regularly. Attach bath mats securely with double-sided non-slip rug tape. Do not have throw rugs and other things on the floor that can make you trip. What can I do in the bedroom? Use night lights. Make sure that you have a light by your bed that is easy to reach. Do not use any sheets or blankets that are too big for your bed. They should not hang down onto the floor. Have a firm chair that has side arms. You can use this for support while you get dressed. Do not have throw rugs and other things on the floor that can make you trip. What can I do in the kitchen? Clean up any spills right away. Avoid walking on wet floors. Keep items that you use a lot in easy-to-reach places. If you need to reach something above you, use a strong step stool that has a grab bar. Keep electrical cords out of the way. Do not use floor polish or wax that makes floors slippery. If you must use wax, use non-skid floor wax. Do not have throw rugs and other things on the floor that can make you trip. What can I do with my  stairs? Do not leave any items on the stairs. Make sure that there are handrails on both sides of the stairs and use them. Fix handrails that are broken or loose. Make sure that handrails are as long as the stairways. Check any carpeting to make sure that it is firmly attached to the stairs. Fix any carpet that is loose or worn. Avoid having throw rugs at the top or bottom of the stairs. If you do have throw rugs, attach them to the floor with carpet tape. Make sure that you have a light switch at the top of the stairs and the bottom of the stairs. If you do not have them, ask someone to add them for you. What else can I do to help prevent falls? Wear shoes that: Do not have high  heels. Have rubber bottoms. Are comfortable and fit you well. Are closed at the toe. Do not wear sandals. If you use a stepladder: Make sure that it is fully opened. Do not climb a closed stepladder. Make sure that both sides of the stepladder are locked into place. Ask someone to hold it for you, if possible. Clearly mark and make sure that you can see: Any grab bars or handrails. First and last steps. Where the edge of each step is. Use tools that help you move around (mobility aids) if they are needed. These include: Canes. Walkers. Scooters. Crutches. Turn on the lights when you go into a dark area. Replace any light bulbs as soon as they burn out. Set up your furniture so you have a clear path. Avoid moving your furniture around. If any of your floors are uneven, fix them. If there are any pets around you, be aware of where they are. Review your medicines with your doctor. Some medicines can make you feel dizzy. This can increase your chance of falling. Ask your doctor what other things that you can do to help prevent falls. This information is not intended to replace advice given to you by your health care provider. Make sure you discuss any questions you have with your health care provider. Document Released: 08/30/2009 Document Revised: 04/10/2016 Document Reviewed: 12/08/2014 Elsevier Interactive Patient Education  2017 [employer].

## 2021-09-03 NOTE — Progress Notes (Signed)
Subjective:   Lindsay Villarreal is a 75 y.o. female who presents for Medicare Annual (Subsequent) preventive examination.  Virtual Visit via Telephone Note  I connected with  Lindsay Villarreal on 09/03/21 at 10:30 AM EDT by telephone and verified that I am speaking with the correct person using two identifiers.  Location: Patient: Home Provider: [facility] Persons participating in the virtual visit: patient/Nurse Health Advisor   I discussed the limitations, risks, security and privacy concerns of performing an evaluation and management service by telephone and the availability of in person appointments. The patient expressed understanding and agreed to proceed.  Interactive audio and video telecommunications were attempted between this nurse and patient, however failed, due to patient having technical difficulties OR patient did not have access to video capability.  We continued and completed visit with audio only.  Some vital signs may be absent or patient reported.   Jancarlo Biermann E Roxsana Riding, LPN   Review of Systems     Cardiac Risk Factors include: advanced age (>72mn, >>41women);diabetes mellitus;dyslipidemia;hypertension;smoking/ tobacco exposure;sedentary lifestyle     Objective:    Today's Vitals   09/03/21 1028  Weight: 130 lb (59 kg)  Height: '5\' 3"'  (1.6 m)   Body mass index is 23.03 kg/m.  Advanced Directives 09/03/2021 02/22/2021 02/12/2021 08/30/2020 08/30/2019 03/09/2017 02/18/2016  Does Patient Have a Medical Advance Directive? No No No No No No No  Would patient like information on creating a medical advance directive? No - Patient declined No - Patient declined Yes (ED - Information included in AVS) No - Patient declined No - Patient declined Yes (MAU/Ambulatory/Procedural Areas - Information given) Yes - Educational materials given    Current Medications (verified) Outpatient Encounter Medications as of 09/03/2021  Medication Sig   aspirin 81 MG tablet Take 81 mg by mouth at  bedtime.   b complex vitamins capsule Take 1 capsule by mouth daily.   calcium carbonate (OSCAL) 1500 (600 Ca) MG TABS tablet Take 600 mg of elemental calcium by mouth daily with breakfast.   cholecalciferol (VITAMIN D3) 25 MCG (1000 UNIT) tablet Take 1,000 Units by mouth daily.   denosumab (PROLIA) 60 MG/ML SOLN injection Inject 60 mg into the skin every 6 (six) months. Administer in upper arm, thigh, or abdomen   Ferrous Sulfate (IRON) 325 (65 Fe) MG TABS Take 1 tablet (325 mg total) by mouth daily.   gabapentin (NEURONTIN) 100 MG capsule Take 1 capsule (100 mg total) by mouth 3 (three) times daily.   glucose blood (ONETOUCH ULTRA) test strip CHECK GLUCOSE ONCE DAILY  Dx E11.8   lisinopril (ZESTRIL) 5 MG tablet Take 1 tablet by mouth once daily   metFORMIN (GLUCOPHAGE) 500 MG tablet Take 1 tablet (500 mg total) by mouth 2 (two) times daily.   Multiple Vitamins-Minerals (ZINC PO) Take 1 tablet by mouth daily.   OneTouch Delica Lancets 319FMISC USE ONE  TO CHECK GLUCOSE ONCE DAILY Dx E11.40   polyvinyl alcohol (LIQUIFILM TEARS) 1.4 % ophthalmic solution Place 1 drop into both eyes as needed for dry eyes.   simvastatin (ZOCOR) 20 MG tablet Take 1 tablet (20 mg total) by mouth daily.   Sodium Sulfate-Mag Sulfate-KCl (SUTAB) 1813-181-0911MG TABS Take 1 kit by mouth as directed. Coupon=BIN:[id number], PCN: [id number], GROUP:[id number], [id number]  No facility-administered encounter medications on file as of 09/03/2021.    Allergies (verified) Codeine   History: Past Medical History:  Diagnosis Date   Anemia    Po iron   Arthritis  Bilateral cataracts    Essential hypertension    GERD (gastroesophageal reflux disease)    Heart murmur    History of kidney stones    Hyperlipidemia    Neuromuscular disorder ([place])    Osteoporosis    Type 2 diabetes mellitus ([place])    Past Surgical History:  Procedure Laterality Date   APPENDECTOMY  11/17/1976   BLADDER REPAIR     CATARACT EXTRACTION W/  INTRAOCULAR LENS IMPLANT  2010 (left), 2013 (right)   COLONOSCOPY  04/30/2015   Dr.Perry   COLONOSCOPY  2013   EYE SURGERY     POLYPECTOMY     TUBAL LIGATION     VAGINAL HYSTERECTOMY  11/17/1976   XI ROBOTIC ASSISTED INGUINAL HERNIA REPAIR WITH MESH Bilateral 02/22/2021   Procedure: XI ROBOTIC ASSISTED BILATERAL INGUINAL HERNIA REPAIR;  Surgeon: Clovis Riley, MD;  Location: [facility];  Service: General;  Laterality: Bilateral;  2.5 HOURS   Family History  Problem Relation Age of Onset   Heart attack Mother    Heart failure Mother    Stomach cancer Father    Pancreatic cancer Father    Liver cancer Father    Other Sister 76       Cancer of Bile Duct   Breast cancer Sister    Diabetes Sister    Brain cancer Sister 81   Sarcoidosis Sister    Colon cancer Neg Hx    Esophageal cancer Neg Hx    Rectal cancer Neg Hx    Colon polyps Neg Hx    Social History   Socioeconomic History   Marital status: Divorced    Spouse name: Not on file   Number of children: 3   Years of education: 12   Highest education level: High school graduate  Occupational History   Occupation: Teacher, English as a foreign language, Paediatric nurse    Comment: retired  Tobacco Use   Smoking status: Every Day    Packs/day: 0.50    Years: 50.00    Pack years: 25.00    Types: Cigarettes   Smokeless tobacco: Never  Vaping Use   Vaping Use: Never used  Substance and Sexual Activity   Alcohol use: No   Drug use: No   Sexual activity: Not Currently  Other Topics Concern   Not on file  Social History Narrative   Lives alone - one level. Children live close by   Social Determinants of Health   Financial Resource Strain: Low Risk    Difficulty of Paying Living Expenses: Not very hard  Food Insecurity: No Food Insecurity   Worried About Charity fundraiser in the Last Year: Never true   Ran Out of Food in the Last Year: Never true  Transportation Needs: No Transportation Needs   Lack of Transportation (Medical): No   Lack  of Transportation (Non-Medical): No  Physical Activity: Inactive   Days of Exercise per Week: 0 days   Minutes of Exercise per Session: 0 min  Stress: No Stress Concern Present   Feeling of Stress : Not at all  Social Connections: Socially Isolated   Frequency of Communication with Friends and Family: More than three times a week   Frequency of Social Gatherings with Friends and Family: More than three times a week   Attends Religious Services: Never   Marine scientist or Organizations: No   Attends Archivist Meetings: Never   Marital Status: Divorced    Tobacco Counseling Ready to quit: No Counseling given: Yes  Clinical Intake:  Pre-visit preparation completed: Yes  Pain : No/denies pain     BMI - recorded: 23.03 Nutritional Status: BMI of 19-24  Normal Nutritional Risks: None Diabetes: No  How often do you need to have someone help you when you read instructions, pamphlets, or other written materials from your doctor or pharmacy?: 1 - Never  Diabetic? Yes Nutrition Risk Assessment:  Has the patient had any N/V/D within the last 2 months?  No  Does the patient have any non-healing wounds?  No  Has the patient had any unintentional weight loss or weight gain?  No   Diabetes:  Is the patient diabetic?  Yes  If diabetic, was a CBG obtained today?  No  Did the patient bring in their glucometer from home?  No  How often do you monitor your CBG's? 2-3 times per week.   Financial Strains and Diabetes Management:  Are you having any financial strains with the device, your supplies or your medication? No .  Does the patient want to be seen by Chronic Care Management for management of their diabetes?  No  Would the patient like to be referred to a Nutritionist or for Diabetic Management?  No   Diabetic Exams:  Diabetic Eye Exam: Completed 02/01/2020. Overdue for diabetic eye exam. Pt has been advised about the importance in completing this exam.   Diabetic Foot Exam: Completed 04/22/2021. Pt has been advised about the importance in completing this exam. Pt is scheduled for diabetic foot exam on next year.    Interpreter Needed?: No  Information entered by :: Dugan Vanhoesen, LPN   Activities of Daily Living In your present state of health, do you have any difficulty performing the following activities: 09/03/2021 02/12/2021  Hearing? N N  Vision? N N  Difficulty concentrating or making decisions? N N  Walking or climbing stairs? N N  Dressing or bathing? N N  Doing errands, shopping? N N  Preparing Food and eating ? N -  Using the Toilet? N -  In the past six months, have you accidently leaked urine? N -  Do you have problems with loss of bowel control? N -  Managing your Medications? N -  Managing your Finances? N -  Housekeeping or managing your Housekeeping? N -  Some recent data might be hidden    Patient Care Team: [name], FNP as PCP - General (Nurse Practitioner) Satira Sark, MD as PCP - Cardiology (Cardiology) Okey Regal, [place] as Consulting Physician (Optometry) Beryle Beams Alyson Locket, MD as Consulting Physician (Hematology) Irene Shipper, MD as Consulting Physician (Gastroenterology)  Indicate any recent Medical Services you may have received from other than [facility] providers in the past year (date may be approximate).     Assessment:   This is a routine wellness examination for [place].  Hearing/Vision screen Hearing Screening - Comments:: Denies hearing difficulties  Vision Screening - Comments:: Wears rx glasses - behind on annual eye exams at [place] issues and exercise activities discussed: Current Exercise Habits: The patient does not participate in regular exercise at present, Exercise limited by: neurologic condition(s)   Goals Addressed             This Visit's Progress    DIET - INCREASE WATER INTAKE   Not on track    Try to drink 6-8 glasses of water daily.  Cut back on the diet Dr Malachi Bonds     Exercise 150 min/wk Moderate Activity   Not  on track      Depression Screen PHQ 2/9 Scores 09/03/2021 04/22/2021 01/08/2021 06/18/2020 03/02/2020 12/02/2019 08/30/2019  PHQ - 2 Score 0 0 0 0 0 0 0  PHQ- 9 Score - 0 - - - - -    Fall Risk Fall Risk  09/03/2021 04/22/2021 01/08/2021 11/13/2020 08/30/2020  Falls in the past year? 0 0 0 0 0  Number falls in past yr: 0 - - - -  Injury with Fall? 0 - - - -  Risk for fall due to : Medication side effect;Other (Comment) - - - -  Risk for fall due to: Comment neuropathy - - - -  Follow up Falls prevention discussed - - Falls evaluation completed -  Comment - - - - -    FALL RISK PREVENTION PERTAINING TO THE HOME:  Any stairs in or around the home? No  If so, are there any without handrails? No  Home free of loose throw rugs in walkways, pet beds, electrical cords, etc? Yes  Adequate lighting in your home to reduce risk of falls? Yes   ASSISTIVE DEVICES UTILIZED TO PREVENT FALLS:  Life alert? No  Use of a cane, walker or w/c? No  Grab bars in the bathroom? Yes  Shower chair or bench in shower? No  Elevated toilet seat or a handicapped toilet? Yes   TIMED UP AND GO:  Was the test performed? No . Telephonic visit  Cognitive Function: Normal cognitive status assessed by direct observation by this Nurse Health Advisor. No abnormalities found.   MMSE - Mini Mental State Exam 03/09/2017 02/18/2016 02/12/2015  Orientation to time '4 5 5  ' Orientation to Place '5 5 5  ' Registration '3 3 3  ' Attention/ Calculation '5 5 5  ' Recall '3 3 3  ' Language- name 2 objects '2 2 2  ' Language- repeat '1 1 1  ' Language- follow 3 step command '3 3 3  ' Language- read & follow direction '1 1 1  ' Write a sentence '1 1 1  ' Copy design '1 1 1  ' Total score '29 30 30     ' 6CIT Screen 08/30/2020 08/30/2019  What Year? 0 points 0 points  What month? 0 points 0 points  What time? 0 points 0 points  Count back from 20 0 points 0 points  Months in  reverse 0 points 0 points  Repeat phrase 0 points 0 points  Total Score 0 0    Immunizations Immunization History  Administered Date(s) Administered   Hepatitis B 12/01/2018   Hepatitis B, adult 02/22/2018, 12/01/2018   Influenza, High Dose Seasonal PF 10/04/2015, 09/04/2016, 09/17/2017, 08/07/2018, 10/11/2019   Influenza,inj,Quad PF,6+ Mos 09/09/2013, 08/14/2014   Moderna Sars-Covid-2 Vaccination 11/19/2019, 01/13/2020   Pneumococcal Conjugate-13 10/30/2014   Pneumococcal Polysaccharide-23 07/30/2012   Tdap 07/30/2012   Zoster Recombinat (Shingrix) 08/07/2018, 11/13/2018    TDAP status: Up to date  Flu Vaccine status: Due, Education has been provided regarding the importance of this vaccine. Advised may receive this vaccine at local pharmacy or Health Dept. Aware to provide a copy of the vaccination record if obtained from local pharmacy or Health Dept. Verbalized acceptance and understanding.  Pneumococcal vaccine status: Up to date  Covid-19 vaccine status: Completed vaccines  Qualifies for Shingles Vaccine? Yes   Zostavax completed Yes   Shingrix Completed?: Yes  Screening Tests Health Maintenance  Topic Date Due   COVID-19 Vaccine (3 - Booster for Moderna series) 06/11/2020   OPHTHALMOLOGY EXAM  01/31/2021   INFLUENZA VACCINE  06/17/2021   LIPID PANEL  10/22/2021   HEMOGLOBIN A1C  10/22/2021   DEXA SCAN  12/01/2021   FOOT EXAM  04/22/2022   TETANUS/TDAP  07/30/2022   COLONOSCOPY (Pts 45-72yr Insurance coverage will need to be confirmed)  09/02/2026   Hepatitis C Screening  Completed   Zoster Vaccines- Shingrix  Completed   HPV VACCINES  Aged Out    Health Maintenance  Health Maintenance Due  Topic Date Due   COVID-19 Vaccine (3 - Booster for Moderna series) 06/11/2020   OPHTHALMOLOGY EXAM  01/31/2021   INFLUENZA VACCINE  06/17/2021    Colorectal cancer screening: Type of screening: Colonoscopy. Completed 09/02/2021. Repeat every 5 years  Mammogram  status: Completed 02/13/2021. Repeat every year  Bone Density status: Completed 12/02/2019. Results reflect: Bone density results: OSTEOPOROSIS. Repeat every 2 years.  Lung Cancer Screening: (Low Dose CT Chest recommended if Age 75-80years, 30 pack-year currently smoking OR have quit w/in 15years.) does qualify.   Lung Cancer Screening Referral:  last done 2018 - discussed with patient - repeat ordered today  Additional Screening:  Hepatitis C Screening: does qualify; Completed 02/04/2016  Vision Screening: Recommended annual ophthalmology exams for early detection of glaucoma and other disorders of the eye. Is the patient up to date with their annual eye exam?  No  Who is the provider or what is the name of the office in which the patient attends annual eye exams[place] is not established with a provider, would they like to be referred to a provider to establish care? No .   Dental Screening: Recommended annual dental exams for proper oral hygiene  Community Resource Referral / Chronic Care Management: CRR required this visit?  No   CCM required this visit?  No      Plan:     I have personally reviewed and noted the following in the patient's chart:   Medical and social history Use of alcohol, tobacco or illicit drugs  Current medications and supplements including opioid prescriptions.  Functional ability and status Nutritional status Physical activity Advanced directives List of other physicians Hospitalizations, surgeries, and ER visits in previous 12 months Vitals Screenings to include cognitive, depression, and falls Referrals and appointments  In addition, I have reviewed and discussed with patient certain preventive protocols, quality metrics, and best practice recommendations. A written personalized care plan for preventive services as well as general preventive health recommendations were provided to patient.     ASandrea Hammond LPN   [id number]   Nurse Notes: None

## 2021-09-04 ENCOUNTER — Telehealth: Payer: Self-pay

## 2021-09-04 NOTE — Telephone Encounter (Signed)
  Follow up Call-  Call back number 09/02/2021  Post procedure Call Back phone  # [phone]  Permission to leave phone message Yes  Some recent data might be hidden     Patient questions:  Do you have a fever, pain , or abdominal swelling? No. Pain Score  0 *  Have you tolerated food without any problems? Yes.    Have you been able to return to your normal activities? Yes.    Do you have any questions about your discharge instructions: Diet   No. Medications  No. Follow up visit  No.  Do you have questions or concerns about your Care? No.  Actions: * If pain score is 4 or above: No action needed, pain <4.  Have you developed a fever since your procedure? no  2.   Have you had an respiratory symptoms (SOB or cough) since your procedure? no  3.   Have you tested positive for COVID 19 since your procedure no  4.   Have you had any family members/close contacts diagnosed with the COVID 19 since your procedure?  no   If yes to any of these questions please route to Joylene John, RN and Joella Prince, RN

## 2021-09-05 ENCOUNTER — Encounter: Payer: Self-pay | Admitting: Internal Medicine

## 2021-09-06 LAB — HM DIABETES EYE EXAM

## 2021-09-17 ENCOUNTER — Other Ambulatory Visit: Payer: Self-pay

## 2021-09-17 ENCOUNTER — Ambulatory Visit (INDEPENDENT_AMBULATORY_CARE_PROVIDER_SITE_OTHER): Payer: Medicare HMO

## 2021-09-17 ENCOUNTER — Ambulatory Visit (INDEPENDENT_AMBULATORY_CARE_PROVIDER_SITE_OTHER): Payer: Medicare HMO | Admitting: Nurse Practitioner

## 2021-09-17 ENCOUNTER — Encounter: Payer: Self-pay | Admitting: Nurse Practitioner

## 2021-09-17 VITALS — BP 124/66 | HR 68 | Temp 97.0°F | Resp 20 | Ht 63.0 in | Wt 130.0 lb

## 2021-09-17 DIAGNOSIS — Z23 Encounter for immunization: Secondary | ICD-10-CM | POA: Diagnosis not present

## 2021-09-17 DIAGNOSIS — R222 Localized swelling, mass and lump, trunk: Secondary | ICD-10-CM

## 2021-09-17 DIAGNOSIS — R911 Solitary pulmonary nodule: Secondary | ICD-10-CM | POA: Diagnosis not present

## 2021-09-17 NOTE — Progress Notes (Signed)
   Subjective:    Patient ID: Lindsay Villarreal, female    DOB: 05/08/1946, 75 y.o.   MRN: [id number]  Chief Complaint: knot in center of breast (Noticed 1 week ago. No pain)   HPI Knot in middle of chest. Just noticed it several days ago. Denies any pain on palpation.     Review of Systems  Constitutional:  Negative for diaphoresis.  Eyes:  Negative for pain.  Respiratory:  Negative for shortness of breath.   Cardiovascular:  Negative for chest pain, palpitations and leg swelling.  Gastrointestinal:  Negative for abdominal pain.  Endocrine: Negative for polydipsia.  Skin:  Negative for rash.  Neurological:  Negative for dizziness, weakness and headaches.  Hematological:  Does not bruise/bleed easily.  All other systems reviewed and are negative.     Objective:   Physical Exam Constitutional:      Appearance: Normal appearance.  Cardiovascular:     Rate and Rhythm: Normal rate and regular rhythm.  Pulmonary:     Effort: Pulmonary effort is normal.     Breath sounds: Normal breath sounds.     Comments: prominent xyphoid process Neurological:     Mental Status: She is alert.   BP 124/66   Pulse 68   Temp (!) 97 F (36.1 C) (Temporal)   Resp 20   Ht 5\' 3"  (1.6 m)   Wt 130 lb (59 kg)   SpO2 100%   BMI 23.03 kg/m   CHest xray- xyphoid process curving outward-Preliminary reading by Ronnald Collum, FNP  [facility]        Assessment & Plan:  Lindsay Villarreal in today with chief complaint of knot in center of breast (Noticed 1 week ago. No pain)   1. Nodule of chest wall Prominent xyphoid process - DG Chest 2 View    The above assessment and management plan was discussed with the patient. The patient verbalized understanding of and has agreed to the management plan. Patient is aware to call the clinic if symptoms persist or worsen. Patient is aware when to return to the clinic for a follow-up visit. Patient educated on when it is appropriate to go to the emergency department.    Mary-Margaret Hassell Done, FNP

## 2021-09-18 ENCOUNTER — Other Ambulatory Visit: Payer: Self-pay | Admitting: Nurse Practitioner

## 2021-09-18 DIAGNOSIS — E1143 Type 2 diabetes mellitus with diabetic autonomic (poly)neuropathy: Secondary | ICD-10-CM

## 2021-10-10 ENCOUNTER — Other Ambulatory Visit: Payer: Self-pay | Admitting: Nurse Practitioner

## 2021-10-10 DIAGNOSIS — I10 Essential (primary) hypertension: Secondary | ICD-10-CM

## 2021-10-22 ENCOUNTER — Encounter: Payer: Self-pay | Admitting: Nurse Practitioner

## 2021-10-22 ENCOUNTER — Ambulatory Visit (INDEPENDENT_AMBULATORY_CARE_PROVIDER_SITE_OTHER): Payer: Medicare HMO | Admitting: Nurse Practitioner

## 2021-10-22 VITALS — BP 119/60 | HR 66 | Temp 98.2°F | Resp 20 | Ht 63.0 in | Wt 133.0 lb

## 2021-10-22 DIAGNOSIS — D509 Iron deficiency anemia, unspecified: Secondary | ICD-10-CM | POA: Diagnosis not present

## 2021-10-22 DIAGNOSIS — K219 Gastro-esophageal reflux disease without esophagitis: Secondary | ICD-10-CM

## 2021-10-22 DIAGNOSIS — E1143 Type 2 diabetes mellitus with diabetic autonomic (poly)neuropathy: Secondary | ICD-10-CM

## 2021-10-22 DIAGNOSIS — M25562 Pain in left knee: Secondary | ICD-10-CM

## 2021-10-22 DIAGNOSIS — E782 Mixed hyperlipidemia: Secondary | ICD-10-CM | POA: Diagnosis not present

## 2021-10-22 DIAGNOSIS — M810 Age-related osteoporosis without current pathological fracture: Secondary | ICD-10-CM

## 2021-10-22 DIAGNOSIS — E118 Type 2 diabetes mellitus with unspecified complications: Secondary | ICD-10-CM | POA: Diagnosis not present

## 2021-10-22 DIAGNOSIS — I10 Essential (primary) hypertension: Secondary | ICD-10-CM

## 2021-10-22 LAB — BAYER DCA HB A1C WAIVED: HB A1C (BAYER DCA - WAIVED): 6.1 % — ABNORMAL HIGH (ref 4.8–5.6)

## 2021-10-22 LAB — CMP14+EGFR
ALT: 14 IU/L (ref 0–32)
AST: 16 IU/L (ref 0–40)
Albumin/Globulin Ratio: 3 — ABNORMAL HIGH (ref 1.2–2.2)
Albumin: 4.5 g/dL (ref 3.7–4.7)
Alkaline Phosphatase: 57 IU/L (ref 44–121)
BUN/Creatinine Ratio: 25 (ref 12–28)
BUN: 17 mg/dL (ref 8–27)
Bilirubin Total: 0.3 mg/dL (ref 0.0–1.2)
CO2: 27 mmol/L (ref 20–29)
Calcium: 9.3 mg/dL (ref 8.7–10.3)
Chloride: 103 mmol/L (ref 96–106)
Creatinine, Ser: 0.69 mg/dL (ref 0.57–1.00)
Globulin, Total: 1.5 g/dL (ref 1.5–4.5)
Glucose: 109 mg/dL — ABNORMAL HIGH (ref 70–99)
Potassium: 4.2 mmol/L (ref 3.5–5.2)
Sodium: 142 mmol/L (ref 134–144)
Total Protein: 6 g/dL (ref 6.0–8.5)
eGFR: 90 mL/min/{1.73_m2} (ref >59)

## 2021-10-22 LAB — CBC WITH DIFFERENTIAL/PLATELET
Basophils Absolute: 0.1 10*3/uL (ref 0.0–0.2)
Basos: 1 %
EOS (ABSOLUTE): 0.1 10*3/uL (ref 0.0–0.4)
Eos: 1 %
Hematocrit: 40.4 % (ref 34.0–46.6)
Hemoglobin: 13.3 g/dL (ref 11.1–15.9)
Immature Grans (Abs): 0 10*3/uL (ref 0.0–0.1)
Immature Granulocytes: 0 %
Lymphocytes Absolute: 2.3 10*3/uL (ref 0.7–3.1)
Lymphs: 36 %
MCH: 29.4 pg (ref 26.6–33.0)
MCHC: 32.9 g/dL (ref 31.5–35.7)
MCV: 89 fL (ref 79–97)
Monocytes Absolute: 0.5 10*3/uL (ref 0.1–0.9)
Monocytes: 7 %
Neutrophils Absolute: 3.6 10*3/uL (ref 1.4–7.0)
Neutrophils: 55 %
Platelets: 301 10*3/uL (ref 150–450)
RBC: 4.52 x10E6/uL (ref 3.77–5.28)
RDW: 11.9 % (ref 11.7–15.4)
WBC: 6.5 10*3/uL (ref 3.4–10.8)

## 2021-10-22 LAB — LIPID PANEL
Chol/HDL Ratio: 2.9 ratio (ref 0.0–4.4)
Cholesterol, Total: 120 mg/dL (ref 100–199)
HDL: 42 mg/dL (ref >39)
LDL Chol Calc (NIH): 58 mg/dL (ref 0–99)
Triglycerides: 108 mg/dL (ref 0–149)
VLDL Cholesterol Cal: 20 mg/dL (ref 5–40)

## 2021-10-22 MED ORDER — GABAPENTIN 100 MG PO CAPS: 100.0000 mg | ORAL_CAPSULE | Freq: Three times a day (TID) | ORAL | 1 refills | Status: DC

## 2021-10-22 MED ORDER — LISINOPRIL 5 MG PO TABS: 5.0000 mg | ORAL_TABLET | Freq: Every day | ORAL | 1 refills | Status: DC

## 2021-10-22 MED ORDER — METHYLPREDNISOLONE ACETATE 40 MG/ML IJ SUSP
40.0000 mg | Freq: Once | INTRAMUSCULAR | Status: AC
Start: 2021-10-22 — End: 2021-10-22
  Administered 2021-10-22: 40 mg via INTRAMUSCULAR

## 2021-10-22 MED ORDER — IRON 325 (65 FE) MG PO TABS: 1.0000 | ORAL_TABLET | Freq: Every day | ORAL | 1 refills | Status: DC

## 2021-10-22 MED ORDER — LIDOCAINE HCL 2 % IJ SOLN
1.0000 mL | Freq: Once | INTRAMUSCULAR | Status: AC
  Administered 2021-10-22: 20 mg via INTRADERMAL

## 2021-10-22 MED ORDER — SIMVASTATIN 20 MG PO TABS: 20.0000 mg | ORAL_TABLET | Freq: Every day | ORAL | 1 refills | Status: DC

## 2021-10-22 NOTE — Progress Notes (Signed)
Subjective:    Patient ID: Lindsay Villarreal, female    DOB: [birth year], 75 y.o.   MRN: [id number]  Chief Complaint: medical management of chronic issues     HPI:  1. Primary hypertension No c/o chest pain, sob or headache. Does not check blood pressure at home. BP Readings from Last 3 Encounters:  09/17/21 124/66  09/02/21 (!) 125/48  04/22/21 (!) 129/59     2. Mixed hyperlipidemia Does try to watch diet but does not do dedicated exercise. Lab Results  Component Value Date   CHOL 116 04/22/2021   HDL 40 04/22/2021   LDLCALC 55 04/22/2021   TRIG 118 04/22/2021   CHOLHDL 2.9 04/22/2021     3. Type 2 diabetes mellitus with complication, without long-term current use of insulin (HCC) She has not been checking her blood sugars at home. She does watch her diet. Lab Results  Component Value Date   HGBA1C 6.2 04/22/2021     4. Diabetic autonomic neuropathy associated with type 2 diabetes mellitus (HCC) Has numbness and tingling in both of her feet. They do stay very cold  5. Gastroesophageal reflux disease without esophagitis Very rarely has symptoms unless she eats certain foods. Has occcasionlal difficulty swallowing some foods. Says not bad and doe sot want to see anyone about this right now.  6. Iron deficiency anemia, unspecified iron deficiency anemia type Is on a daily iron supplement. Lab Results  Component Value Date   HGB 12.5 04/22/2021     7. Age-related osteoporosis without current pathological fracture Last dexascan was done 12/02/19. Her t score was -1.0. she is currently on prolia injections.   Outpatient Encounter Medications as of 10/22/2021  Medication Sig   aspirin 81 MG tablet Take 81 mg by mouth at bedtime.   b complex vitamins capsule Take 1 capsule by mouth daily.   calcium carbonate (OSCAL) 1500 (600 Ca) MG TABS tablet Take 600 mg of elemental calcium by mouth daily with breakfast.   cholecalciferol (VITAMIN D3) 25 MCG (1000 UNIT) tablet Take  1,000 Units by mouth daily.   denosumab (PROLIA) 60 MG/ML SOLN injection Inject 60 mg into the skin every 6 (six) months. Administer in upper arm, thigh, or abdomen   Ferrous Sulfate (IRON) 325 (65 Fe) MG TABS Take 1 tablet (325 mg total) by mouth daily.   gabapentin (NEURONTIN) 100 MG capsule TAKE 1 CAPSULE BY MOUTH THREE TIMES DAILY   glucose blood (ONETOUCH ULTRA) test strip CHECK GLUCOSE ONCE DAILY  Dx E11.8   lisinopril (ZESTRIL) 5 MG tablet Take 1 tablet by mouth once daily   metFORMIN (GLUCOPHAGE) 500 MG tablet Take 1 tablet (500 mg total) by mouth 2 (two) times daily.   Multiple Vitamins-Minerals (ZINC PO) Take 1 tablet by mouth daily.   OneTouch Delica Lancets 88B MISC USE ONE  TO CHECK GLUCOSE ONCE DAILY Dx E11.40   polyvinyl alcohol (LIQUIFILM TEARS) 1.4 % ophthalmic solution Place 1 drop into both eyes as needed for dry eyes.   simvastatin (ZOCOR) 20 MG tablet Take 1 tablet (20 mg total) by mouth daily.   Sodium Sulfate-Mag Sulfate-KCl (SUTAB) [phone] MG TABS Take 1 kit by mouth as directed. Coupon=BIN:[id number], PCN: [id number], GROUP:[id number], [id number]   No facility-administered encounter medications on file as of 10/22/2021.    Past Surgical History:  Procedure Laterality Date   APPENDECTOMY  11/17/1976   BLADDER REPAIR     CATARACT EXTRACTION W/ INTRAOCULAR LENS IMPLANT  2010 (left), 2013 (right)   COLONOSCOPY  04/30/2015   Dr.Perry   COLONOSCOPY  2013   EYE SURGERY     POLYPECTOMY     TUBAL LIGATION     VAGINAL HYSTERECTOMY  11/17/1976   XI ROBOTIC ASSISTED INGUINAL HERNIA REPAIR WITH MESH Bilateral 02/22/2021   Procedure: XI ROBOTIC ASSISTED BILATERAL INGUINAL HERNIA REPAIR;  Surgeon: Clovis Riley, MD;  Location: [facility];  Service: General;  Laterality: Bilateral;  2.5 HOURS    Family History  Problem Relation Age of Onset   Heart attack Mother    Heart failure Mother    Stomach cancer Father    Pancreatic cancer Father    Liver cancer Father    Other  Sister 53       Cancer of Bile Duct   Breast cancer Sister    Diabetes Sister    Brain cancer Sister 68   Sarcoidosis Sister    Colon cancer Neg Hx    Esophageal cancer Neg Hx    Rectal cancer Neg Hx    Colon polyps Neg Hx     New complaints: Left knee pain. Would like to ave it injected today. She had it injected about 2 years ago and it really helped. Sh ehas been dx with arthritis. Wants to wait until next year to see ortho.  Social history: Lives by herself. Family checks on her daily.  Controlled substance contract: n/a     Review of Systems  Constitutional:  Negative for diaphoresis.  Eyes:  Negative for pain.  Respiratory:  Negative for shortness of breath.   Cardiovascular:  Negative for chest pain, palpitations and leg swelling.  Gastrointestinal:  Negative for abdominal pain.  Endocrine: Negative for polydipsia.  Skin:  Negative for rash.  Neurological:  Negative for dizziness, weakness and headaches.  Hematological:  Does not bruise/bleed easily.  All other systems reviewed and are negative.     Objective:   Physical Exam Vitals and nursing note reviewed.  Constitutional:      General: She is not in acute distress.    Appearance: Normal appearance. She is well-developed.  HENT:     Head: Normocephalic.     Right Ear: Tympanic membrane normal.     Left Ear: Tympanic membrane normal.     Nose: Nose normal.     Mouth/Throat:     Mouth: Mucous membranes are moist.  Eyes:     Pupils: Pupils are equal, round, and reactive to light.  Neck:     Vascular: No carotid bruit or JVD.  Cardiovascular:     Rate and Rhythm: Normal rate and regular rhythm.     Heart sounds: Normal heart sounds.  Pulmonary:     Effort: Pulmonary effort is normal. No respiratory distress.     Breath sounds: Normal breath sounds. No wheezing or rales.  Chest:     Chest wall: No tenderness.  Abdominal:     General: Bowel sounds are normal. There is no distension or abdominal bruit.      Palpations: Abdomen is soft. There is no hepatomegaly, splenomegaly, mass or pulsatile mass.     Tenderness: There is no abdominal tenderness.  Musculoskeletal:        General: Normal range of motion.     Cervical back: Normal range of motion and neck supple.     Comments: FROM of left knee with crepitus on flexion and extension No effusion All ligaments intact  Lymphadenopathy:     Cervical: No cervical adenopathy.  Skin:    General: Skin  is warm and dry.  Neurological:     Mental Status: She is alert and oriented to person, place, and time.     Deep Tendon Reflexes: Reflexes are normal and symmetric.  Psychiatric:        Behavior: Behavior normal.        Thought Content: Thought content normal.        Judgment: Judgment normal.    BP 119/60   Pulse 66   Temp 98.2 F (36.8 C) (Temporal)   Resp 20   Ht '5\' 3"'  (1.6 m)   Wt 133 lb (60.3 kg)   SpO2 98%   BMI 23.56 kg/m   HGBA1c 6.1  Joint Injection/Arthrocentesis  Date/Time: 10/22/2021 9:33 AM Performed by: [name], FNP Authorized by: Hassell Done, Mary-Margaret, FNP  Indications: pain  Body area: knee Local anesthesia used: no  Anesthesia: Local anesthesia used: no  Sedation: Patient sedated: no  Needle size: 22 G Ultrasound guidance: no Approach: superior Betamethasone amount: 40 mg Lidocaine 2% amount: 1 mL Patient tolerance: patient tolerated the procedure well with no immediate complications       Assessment & Plan:   Lindsay Villarreal comes in today with chief complaint of Medical Management of Chronic Issues   Diagnosis and orders addressed:  1. Primary hypertension Low sodium diet - CBC with Differential/Platelet - CMP14+EGFR - lisinopril (ZESTRIL) 5 MG tablet; Take 1 tablet (5 mg total) by mouth daily.  Dispense: 90 tablet; Refill: 1  2. Mixed hyperlipidemia Low fat diet - Lipid panel - simvastatin (ZOCOR) 20 MG tablet; Take 1 tablet (20 mg total) by mouth daily.  Dispense: 90  tablet; Refill: 1  3. Type 2 diabetes mellitus with complication, without long-term current use of insulin (HCC) Continue to watch carbs in diet - Bayer DCA Hb A1c Waived  4. Diabetic autonomic neuropathy associated with type 2 diabetes mellitus ([place]) Do not go barefooted - gabapentin (NEURONTIN) 100 MG capsule; Take 1 capsule (100 mg total) by mouth 3 (three) times daily.  Dispense: 270 capsule; Refill: 1  5. Gastroesophageal reflux disease without esophagitis Avoid spicy foods Do not eat 2 hours prior to bedtime   6. Iron deficiency anemia, unspecified iron deficiency anemia type Continue iron supplements - Ferrous Sulfate (IRON) 325 (65 Fe) MG TABS; Take 1 tablet (325 mg total) by mouth daily.  Dispense: 90 tablet; Refill: 1  7. Age-related osteoporosis without current pathological fracture Weight bearing exercises  8. Left knee pain Ice Elevate Will do ortho referral after the first of the year.  Labs pending Health Maintenance reviewed Diet and exercise encouraged  Follow up plan: 6 months   Mary-Margaret Hassell Done, FNP

## 2021-10-22 NOTE — Patient Instructions (Addendum)
Joint Steroid Injection A joint steroid injection is a procedure to relieve swelling and pain in a joint. Steroids are medicines that reduce inflammation. In this procedure, your health care provider uses a syringe and a needle to inject a steroid medicine into a painful and inflamed joint. A pain-relieving medicine (anesthetic) may be injected along with the steroid. In some cases, your health care provider may use an imaging technique such as ultrasound or fluoroscopy toguide the injection. Joints that are often treated with steroid injections include the knee, shoulder, hip, and spine. These injections may also be used in the elbow, ankle, and joints of the hands or feet. You may have joint steroid injections as part of your treatment for inflammation caused by: Gout. Rheumatoid arthritis. Advanced wear-and-tear arthritis (osteoarthritis). Tendinitis. Bursitis. Joint steroid injections may be repeated, but having them too often can damage a joint or the skin over the joint. You should not have joint steroidinjections less than 6 weeks apart or more than four times a year. Tell a health care provider about: Any allergies you have. All medicines you are taking, including vitamins, herbs, eye drops, creams, and over-the-counter medicines. Any problems you or family members have had with anesthetic medicines. Any blood disorders you have. Any surgeries you have had. Any medical conditions you have. Whether you are pregnant or may be pregnant. What are the risks? Generally, this is a safe treatment. However, problems may occur, including: Infection. Bleeding. Allergic reactions to medicines. Damage to the joint or tissues around the joint. Thinning of skin or loss of skin color over the joint. Temporary flushing of the face or chest. Temporary increase in pain. Temporary increase in blood sugar. Failure to relieve inflammation or pain. What happens before the treatment? Medicines Ask  your health care provider about: Changing or stopping your regular medicines. This is especially important if you are taking diabetes medicines or blood thinners. Taking medicines such as aspirin and ibuprofen. These medicines can thin your blood. Do not take these medicines unless your health care provider tells you to take them. Taking over-the-counter medicines, vitamins, herbs, and supplements. General instructions You may have imaging tests of your joint. Ask your health care provider if you can drive yourself home after the procedure. What happens during the treatment?  Your health care provider will position you for the injection and locate the injection site over your joint. The skin over the joint will be cleaned with a germ-killing soap. Your health care provider may: Spray a numbing solution (topical anesthetic) over the injection site. Inject a local anesthetic under the skin above your joint. The needle will be placed through your skin into your joint. Your health care provider may use imaging to guide the needle to the right spot for the injection. If imaging is used, a special contrast dye may be injected to confirm that the needle is in the correct location. The steroid medicine will be injected into your joint. Anesthetic may be injected along with the steroid. This may be a medicine that relieves pain for a short time (short-acting anesthetic) or for a longer time (long-acting anesthetic). The needle will be removed, and an adhesive bandage (dressing) will be placed over the injection site. The procedure may vary among health care providers and hospitals. What can I expect after the treatment? You will be able to go home after the treatment. It is normal to feel slight flushing for a few days after the injection. After the treatment, it is common to   have an increase in joint pain after the anesthetic has worn off. This may happen about an hour after a short-acting anesthetic  or about 8 hours after a longer-acting anesthetic. You should begin to feel relief from joint pain and swelling after 24 to 48 hours. Contact your health care provider if you do not begin to feel relief after 2 days. Follow these instructions at home: Injection site care Leave the adhesive dressing over your injection site in place until your health care provider says you can remove it. Check your injection site every day for signs of infection. Check for: More redness, swelling, or pain. Fluid or blood. Warmth. Pus or a bad smell. Activity Return to your normal activities as told by your health care provider. Ask your health care provider what activities are safe for you. You may be asked to limit activities that put stress on the joint for a few days. Do joint exercises as told by your health care provider. Do not take baths, swim, or use a hot tub until your health care provider approves. Ask your health care provider if you may take showers. You may only be allowed to take sponge baths. Managing pain, stiffness, and swelling  If directed, put ice on the joint. To do this: Put ice in a plastic bag. Place a towel between your skin and the bag. Leave the ice on for 20 minutes, 2-3 times a day. Remove the ice if your skin turns bright red. This is very important. If you cannot feel pain, heat, or cold, you have a greater risk of damage to the area. Raise (elevate) your joint above the level of your heart when you are sitting or lying down.  General instructions Take over-the-counter and prescription medicines only as told by your health care provider. Do not use any products that contain nicotine or tobacco, such as cigarettes, e-cigarettes, and chewing tobacco. These can delay joint healing. If you need help quitting, ask your health care provider. If you have diabetes, be aware that your blood sugar may be slightly elevated for several days after the injection. Keep all follow-up visits.  This is important. Contact a health care provider if you have: Chills or a fever. Any signs of infection at your injection site. Increased pain or swelling or no relief after 2 days. Summary A joint steroid injection is a treatment to relieve pain and swelling in a joint. Steroids are medicines that reduce inflammation. Your health care provider may add an anesthetic along with the steroid. You may have joint steroid injections as part of your arthritis treatment. Joint steroid injections may be repeated, but having them too often can damage a joint or the skin over the joint. Contact your health care provider if you have a fever, chills, or signs of infection, or if you get no relief from joint pain or swelling. This information is not intended to replace advice given to you by your health care provider. Make sure you discuss any questions you have with your healthcare provider. Document Revised: 04/13/2020 Document Reviewed: 04/13/2020 Elsevier Patient Education  2022 [employer]  

## 2021-10-29 ENCOUNTER — Telehealth: Payer: Self-pay | Admitting: Nurse Practitioner

## 2021-10-29 MED ORDER — BLOOD GLUCOSE METER KIT: PACK | 0 refills | Status: DC

## 2021-10-29 NOTE — Telephone Encounter (Signed)
New meter sent to [facility] patient aware and verbalized understanding.

## 2021-10-29 NOTE — Telephone Encounter (Signed)
Pt called stating that the blood glucose meter that she has had for about 10 yrs has finally quit on her and she needs [facility]MM to send Rx for her to get a new one. Please advise and call patient.

## 2021-10-31 MED ORDER — BLOOD GLUCOSE METER KIT: PACK | 0 refills | Status: AC

## 2021-10-31 NOTE — Telephone Encounter (Signed)
Fax from [place], complete Dx for glucometer was not on script. Corrected & resent

## 2021-10-31 NOTE — Addendum Note (Signed)
Addended by: Antonietta Barcelona D on: 10/31/2021 11:20 AM   Modules accepted: Orders

## 2021-11-19 ENCOUNTER — Ambulatory Visit (INDEPENDENT_AMBULATORY_CARE_PROVIDER_SITE_OTHER): Payer: Medicare HMO | Admitting: *Deleted

## 2021-11-19 DIAGNOSIS — M810 Age-related osteoporosis without current pathological fracture: Secondary | ICD-10-CM

## 2021-11-19 MED ORDER — DENOSUMAB 60 MG/ML ~~LOC~~ SOSY
60.0000 mg | PREFILLED_SYRINGE | Freq: Once | SUBCUTANEOUS | Status: AC
  Administered 2021-11-19: 60 mg via SUBCUTANEOUS

## 2021-11-19 NOTE — Progress Notes (Signed)
Patient in today for Prolia vaccine. 1 ML given SQ in left arm. Patient tolerated well.

## 2021-12-17 IMAGING — DX DG CHEST 2V
2 series · 2 of 2 positions shown · non-contrast
Comparison: 07/17/2015, CT 03/23/2017

CLINICAL DATA: Chest nodule

EXAM:
CHEST - 2 VIEW

[chest pa]
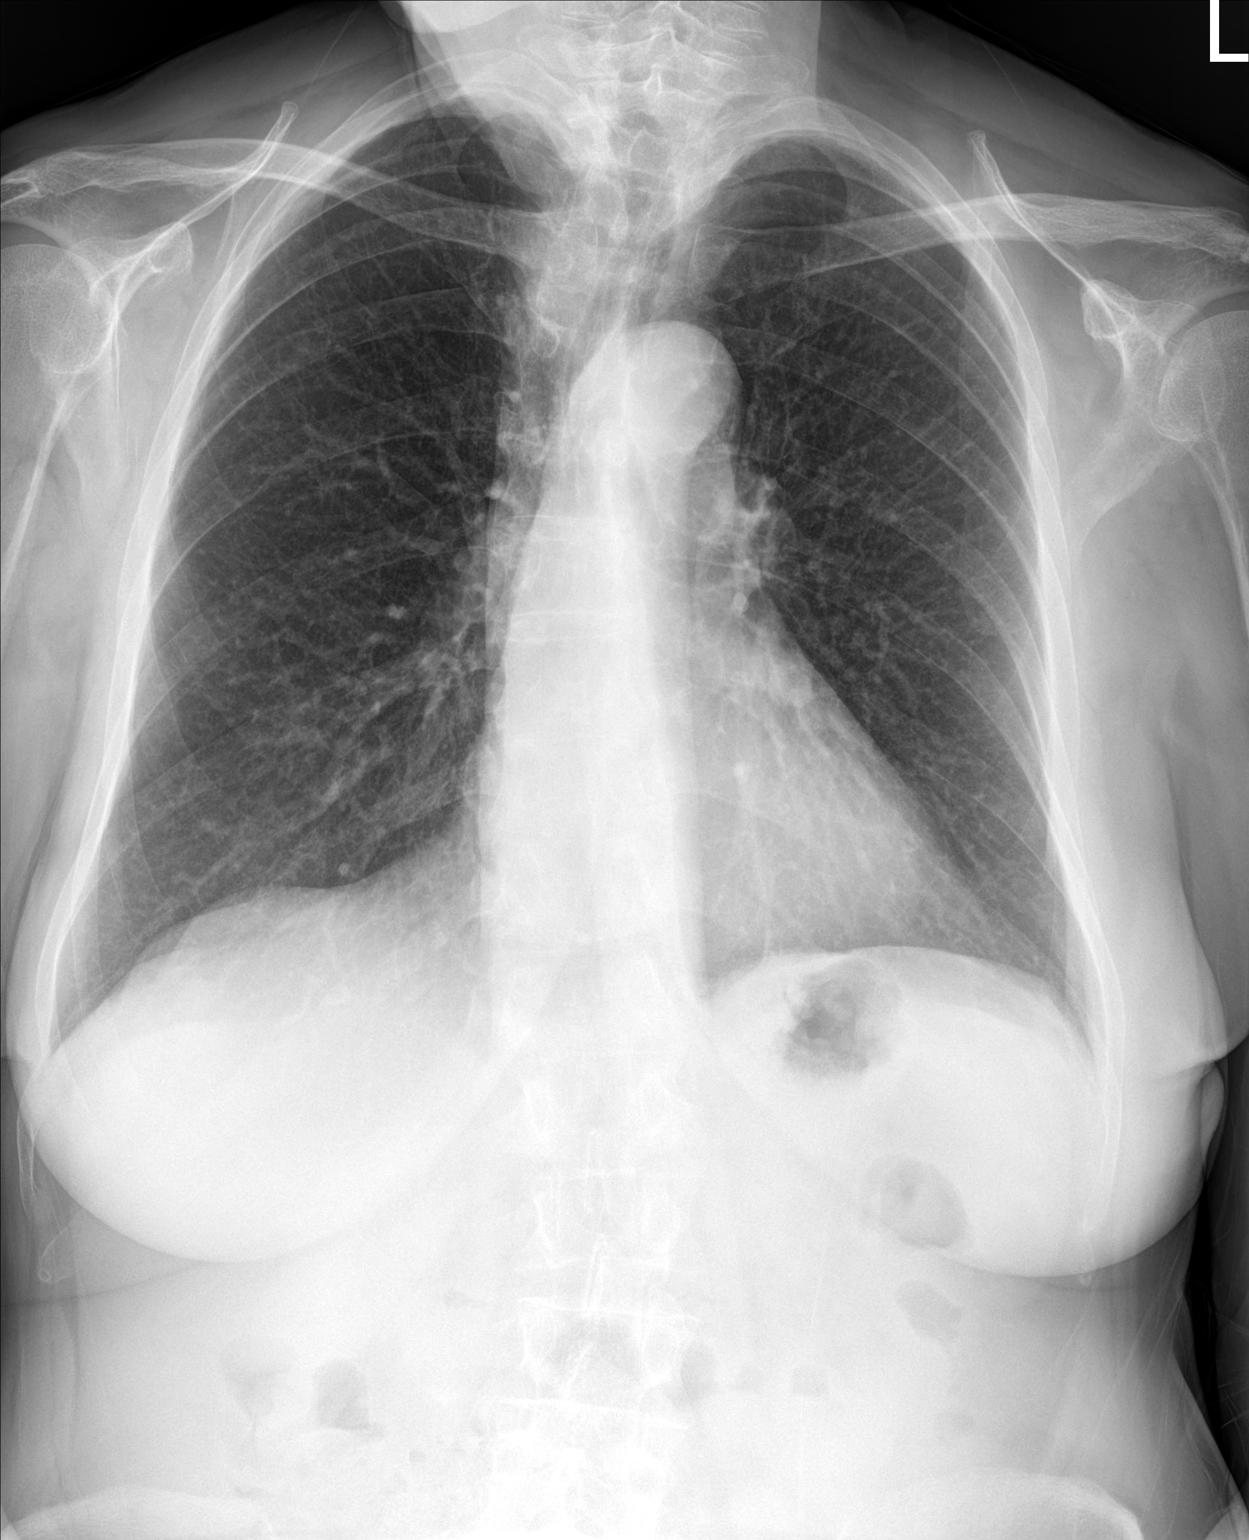

[chest lat]
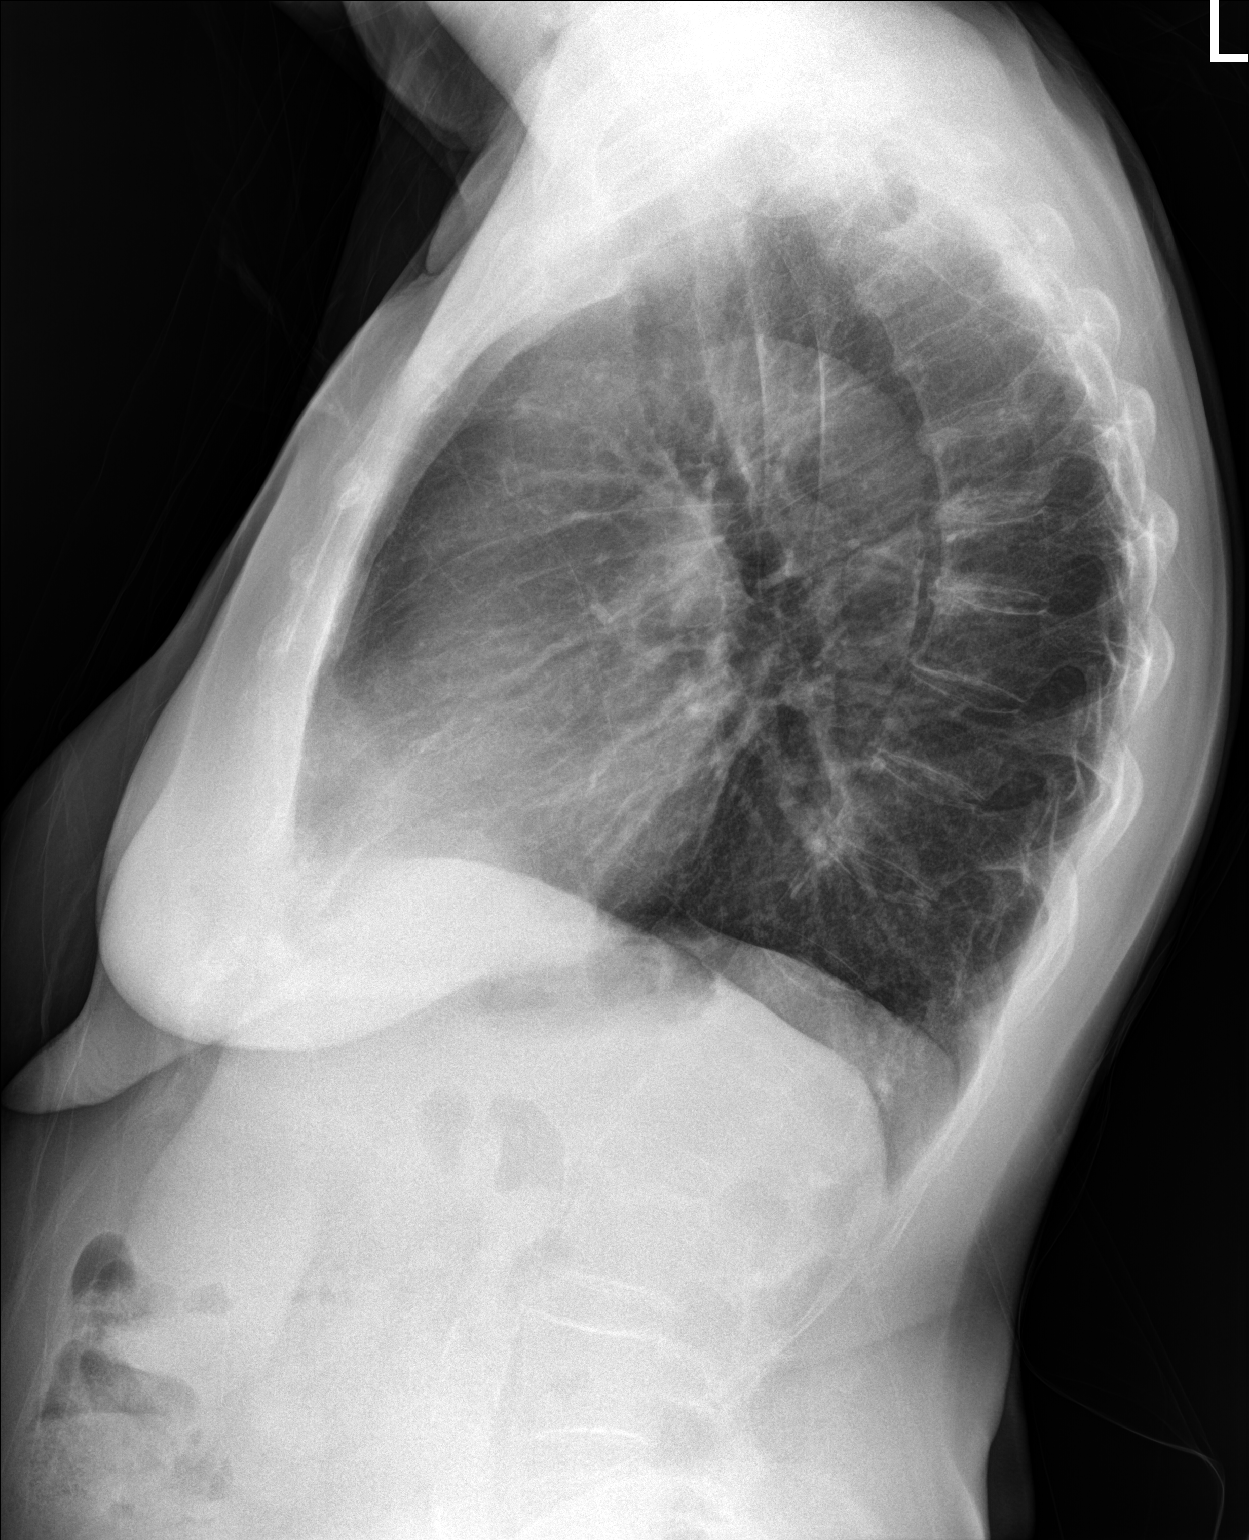

[2 of 2 positions shown; findings below may reference images not displayed]

FINDINGS: Lungs are well expanded, symmetric, and clear. No pneumothorax or
pleural effusion. Cardiac size within normal limits. Pulmonary
vascularity is normal. Osseous structures are age-appropriate. No
acute bone abnormality.
IMPRESSION: No active cardiopulmonary disease.

## 2022-01-05 ENCOUNTER — Other Ambulatory Visit: Payer: Self-pay | Admitting: Nurse Practitioner

## 2022-01-05 DIAGNOSIS — E118 Type 2 diabetes mellitus with unspecified complications: Secondary | ICD-10-CM

## 2022-01-28 ENCOUNTER — Other Ambulatory Visit: Payer: Self-pay | Admitting: Nurse Practitioner

## 2022-01-28 DIAGNOSIS — Z1231 Encounter for screening mammogram for malignant neoplasm of breast: Secondary | ICD-10-CM

## 2022-02-06 DIAGNOSIS — E119 Type 2 diabetes mellitus without complications: Secondary | ICD-10-CM | POA: Diagnosis not present

## 2022-02-06 DIAGNOSIS — H524 Presbyopia: Secondary | ICD-10-CM | POA: Diagnosis not present

## 2022-02-06 DIAGNOSIS — Z0100 Encounter for examination of eyes and vision without abnormal findings: Secondary | ICD-10-CM | POA: Diagnosis not present

## 2022-02-19 ENCOUNTER — Ambulatory Visit
Admission: RE | Admit: 2022-02-19 | Discharge: 2022-02-19 | Disposition: A | Discharge status: Home / Self Care | Payer: Medicare HMO | Source: Ambulatory Visit | Attending: Nurse Practitioner | Admitting: Nurse Practitioner

## 2022-02-19 ENCOUNTER — Ambulatory Visit: Payer: Medicare HMO

## 2022-02-19 DIAGNOSIS — Z1231 Encounter for screening mammogram for malignant neoplasm of breast: Secondary | ICD-10-CM | POA: Diagnosis not present

## 2022-03-06 DIAGNOSIS — D225 Melanocytic nevi of trunk: Secondary | ICD-10-CM | POA: Diagnosis not present

## 2022-03-06 DIAGNOSIS — S90861A Insect bite (nonvenomous), right foot, initial encounter: Secondary | ICD-10-CM | POA: Diagnosis not present

## 2022-03-06 DIAGNOSIS — L814 Other melanin hyperpigmentation: Secondary | ICD-10-CM | POA: Diagnosis not present

## 2022-03-06 DIAGNOSIS — L821 Other seborrheic keratosis: Secondary | ICD-10-CM | POA: Diagnosis not present

## 2022-03-13 ENCOUNTER — Other Ambulatory Visit: Payer: Self-pay | Admitting: Nurse Practitioner

## 2022-03-27 ENCOUNTER — Other Ambulatory Visit: Payer: Self-pay | Admitting: Nurse Practitioner

## 2022-03-27 DIAGNOSIS — E118 Type 2 diabetes mellitus with unspecified complications: Secondary | ICD-10-CM

## 2022-04-15 ENCOUNTER — Encounter: Payer: Self-pay | Admitting: Nurse Practitioner

## 2022-04-15 ENCOUNTER — Ambulatory Visit (INDEPENDENT_AMBULATORY_CARE_PROVIDER_SITE_OTHER): Payer: Medicare HMO | Admitting: Nurse Practitioner

## 2022-04-15 VITALS — BP 125/65 | HR 61 | Temp 97.4°F | Resp 20 | Ht 63.0 in | Wt 139.0 lb

## 2022-04-15 DIAGNOSIS — E782 Mixed hyperlipidemia: Secondary | ICD-10-CM | POA: Diagnosis not present

## 2022-04-15 DIAGNOSIS — M810 Age-related osteoporosis without current pathological fracture: Secondary | ICD-10-CM

## 2022-04-15 DIAGNOSIS — E118 Type 2 diabetes mellitus with unspecified complications: Secondary | ICD-10-CM | POA: Diagnosis not present

## 2022-04-15 DIAGNOSIS — K219 Gastro-esophageal reflux disease without esophagitis: Secondary | ICD-10-CM | POA: Diagnosis not present

## 2022-04-15 DIAGNOSIS — M25562 Pain in left knee: Secondary | ICD-10-CM | POA: Diagnosis not present

## 2022-04-15 DIAGNOSIS — I10 Essential (primary) hypertension: Secondary | ICD-10-CM

## 2022-04-15 DIAGNOSIS — D509 Iron deficiency anemia, unspecified: Secondary | ICD-10-CM | POA: Diagnosis not present

## 2022-04-15 DIAGNOSIS — E1143 Type 2 diabetes mellitus with diabetic autonomic (poly)neuropathy: Secondary | ICD-10-CM

## 2022-04-15 LAB — BAYER DCA HB A1C WAIVED: HB A1C (BAYER DCA - WAIVED): 6.1 % — ABNORMAL HIGH (ref 4.8–5.6)

## 2022-04-15 MED ORDER — METFORMIN HCL 500 MG PO TABS: 500.0000 mg | ORAL_TABLET | Freq: Two times a day (BID) | ORAL | 1 refills | Status: DC

## 2022-04-15 MED ORDER — SIMVASTATIN 20 MG PO TABS: 20.0000 mg | ORAL_TABLET | Freq: Every day | ORAL | 1 refills | Status: DC

## 2022-04-15 MED ORDER — IRON 325 (65 FE) MG PO TABS: 1.0000 | ORAL_TABLET | Freq: Every day | ORAL | 1 refills | Status: DC

## 2022-04-15 MED ORDER — LISINOPRIL 5 MG PO TABS: 5.0000 mg | ORAL_TABLET | Freq: Every day | ORAL | 1 refills | Status: DC

## 2022-04-15 MED ORDER — GABAPENTIN 100 MG PO CAPS: 100.0000 mg | ORAL_CAPSULE | Freq: Three times a day (TID) | ORAL | 1 refills | Status: DC

## 2022-04-15 NOTE — Patient Instructions (Signed)
Acute Knee Pain, Adult Many things can cause knee pain. Sometimes, knee pain is sudden (acute) and may be caused by damage, swelling, or irritation of the muscles and tissues that support your knee. The pain often goes away on its own with time and rest. If the pain does not go away, tests may be done to find out what is causing the pain. Follow these instructions at home: If you have a knee sleeve or brace:  Wear the knee sleeve or brace as told by your doctor. Take it off only as told by your doctor. Loosen it if your toes: Tingle. Become numb. Turn cold and blue. Keep it clean. If the knee sleeve or brace is not waterproof: Do not let it get wet. Cover it with a watertight covering when you take a bath or shower. Activity Rest your knee. Do not do things that cause pain or make pain worse. Avoid activities where both feet leave the ground at the same time (high-impact activities). Examples are running, jumping rope, and doing jumping jacks. Work with a physical therapist to make a safe exercise program, as told by your doctor. Managing pain, stiffness, and swelling  If told, put ice on the knee. To do this: If you have a removable knee sleeve or brace, take it off as told by your doctor. Put ice in a plastic bag. Place a towel between your skin and the bag. Leave the ice on for 20 minutes, 2-3 times a day. Take off the ice if your skin turns bright red. This is very important. If you cannot feel pain, heat, or cold, you have a greater risk of damage to the area. If told, use an elastic bandage to put pressure (compression) on your injured knee. Raise your knee above the level of your heart while you are sitting or lying down. Sleep with a pillow under your knee. General instructions Take over-the-counter and prescription medicines only as told by your doctor. Do not smoke or use any products that contain nicotine or tobacco. If you need help quitting, ask your doctor. If you are  overweight, work with your doctor and a food expert (dietitian) to set goals to lose weight. Being overweight can make your knee hurt more. Watch for any changes in your symptoms. Keep all follow-up visits. Contact a doctor if: The knee pain does not stop. The knee pain changes or gets worse. You have a fever along with knee pain. Your knee is red or feels warm when you touch it. Your knee gives out or locks up. Get help right away if: Your knee swells, and the swelling gets worse. You cannot move your knee. You have very bad knee pain that does not get better with pain medicine. Summary Many things can cause knee pain. The pain often goes away on its own with time and rest. Your doctor may do tests to find out the cause of the pain. Watch for any changes in your symptoms. Relieve your pain with rest, medicines, light activity, and use of ice. Get help right away if you cannot move your knee or your knee pain is very bad. This information is not intended to replace advice given to you by your health care provider. Make sure you discuss any questions you have with your health care provider. Document Revised: 04/18/2020 Document Reviewed: 04/18/2020 Elsevier Patient Education  2023 [employer]  

## 2022-04-15 NOTE — Progress Notes (Signed)
Subjective:    Patient ID: Lindsay Villarreal, female    DOB: 08/13/1946, 76 y.o.   MRN: [id number]   Chief Complaint: medical management of chronic issues     HPI:  Lindsay Villarreal is a 76 y.o. who identifies as a female who was assigned female at birth.   Social history: Lives with: lives by herself Work history: retired   Scientist, forensic in today for follow up of the following chronic medical issues:  1. Primary hypertension No c/o chest pain, sob or headache. Does not check blood pressure at home. BP Readings from Last 3 Encounters:  04/15/22 125/65  10/22/21 119/60  09/17/21 124/66      2. Mixed hyperlipidemia Does try to watch diet but does no dedicated exercise. Lab Results  Component Value Date   CHOL 120 10/22/2021   HDL 42 10/22/2021   LDLCALC 58 10/22/2021   TRIG 108 10/22/2021   CHOLHDL 2.9 10/22/2021     3. Type 2 diabetes mellitus with complication, without long-term current use of insulin (HCC) Fasting blood sugars are averaging 118-130 most days. She does wathc her diet. Denies any  low blood sugars. Lab Results  Component Value Date   HGBA1C 6.1 (H) 10/22/2021     4. Diabetic autonomic neuropathy associated with type 2 diabetes mellitus (HCC) Numbness and tingling in left foot mainly  5. Gastroesophageal reflux disease without esophagitis No prescription medsfor his. Will just use OTC meds when needed.  6. Iron deficiency anemia, unspecified iron deficiency anemia type Takes a multivitamin daily with iron. Lab Results  Component Value Date   HGB 13.3 10/22/2021     7. Age-related osteoporosis without current pathological fracture Is on calcium supplement and vitamin d supplement daily. Her last dexascan was done on -1.0. she is cu.rrently on prolia   New complaints: Left knee pain- needs TKR but is scared to do.  Allergies  Allergen Reactions   Codeine Other (See Comments)    Hallucinations.   Outpatient Encounter Medications as of  04/15/2022  Medication Sig   aspirin 81 MG tablet Take 81 mg by mouth at bedtime.   b complex vitamins capsule Take 1 capsule by mouth daily.   blood glucose meter kit and supplies Dispense based on patient and insurance preference. Use up to four times daily as directed. Dx E11.8   calcium carbonate (OSCAL) 1500 (600 Ca) MG TABS tablet Take 600 mg of elemental calcium by mouth daily with breakfast.   cholecalciferol (VITAMIN D3) 25 MCG (1000 UNIT) tablet Take 1,000 Units by mouth daily.   denosumab (PROLIA) 60 MG/ML SOLN injection Inject 60 mg into the skin every 6 (six) months. Administer in upper arm, thigh, or abdomen   Ferrous Sulfate (IRON) 325 (65 Fe) MG TABS Take 1 tablet (325 mg total) by mouth daily.   gabapentin (NEURONTIN) 100 MG capsule Take 1 capsule (100 mg total) by mouth 3 (three) times daily.   glucose blood (ONETOUCH VERIO) test strip Test BS QID Dx E11.40   lisinopril (ZESTRIL) 5 MG tablet Take 1 tablet (5 mg total) by mouth daily.   metFORMIN (GLUCOPHAGE) 500 MG tablet Take 1 tablet by mouth twice daily   Multiple Vitamins-Minerals (ZINC PO) Take 1 tablet by mouth daily.   OneTouch Delica Lancets 86P MISC USE ONE  TO CHECK GLUCOSE ONCE DAILY Dx E11.40   polyvinyl alcohol (LIQUIFILM TEARS) 1.4 % ophthalmic solution Place 1 drop into both eyes as needed for dry eyes.   simvastatin (ZOCOR)  20 MG tablet Take 1 tablet (20 mg total) by mouth daily.   No facility-administered encounter medications on file as of 04/15/2022.    Past Surgical History:  Procedure Laterality Date   APPENDECTOMY  11/17/1976   BLADDER REPAIR     CATARACT EXTRACTION W/ INTRAOCULAR LENS IMPLANT  2010 (left), 2013 (right)   COLONOSCOPY  04/30/2015   Dr.Perry   COLONOSCOPY  2013   EYE SURGERY     POLYPECTOMY     TUBAL LIGATION     VAGINAL HYSTERECTOMY  11/17/1976   XI ROBOTIC ASSISTED INGUINAL HERNIA REPAIR WITH MESH Bilateral 02/22/2021   Procedure: XI ROBOTIC ASSISTED BILATERAL INGUINAL HERNIA  REPAIR;  Surgeon: Clovis Riley, MD;  Location: [facility];  Service: General;  Laterality: Bilateral;  2.5 HOURS    Family History  Problem Relation Age of Onset   Heart attack Mother    Heart failure Mother    Stomach cancer Father    Pancreatic cancer Father    Liver cancer Father    Other Sister 81       Cancer of Bile Duct   Breast cancer Sister    Diabetes Sister    Brain cancer Sister 18   Sarcoidosis Sister    Colon cancer Neg Hx    Esophageal cancer Neg Hx    Rectal cancer Neg Hx    Colon polyps Neg Hx       Controlled substance contract: n/a     Review of Systems  Constitutional:  Negative for diaphoresis.  Eyes:  Negative for pain.  Respiratory:  Negative for shortness of breath.   Cardiovascular:  Positive for leg swelling. Negative for chest pain and palpitations.  Gastrointestinal:  Negative for abdominal pain.  Endocrine: Negative for polydipsia.  Skin:  Negative for rash.  Neurological:  Positive for light-headedness. Negative for dizziness, weakness and headaches.  Hematological:  Does not bruise/bleed easily.  All other systems reviewed and are negative.     Objective:   Physical Exam Vitals and nursing note reviewed.  Constitutional:      General: She is not in acute distress.    Appearance: Normal appearance. She is well-developed.  HENT:     Head: Normocephalic.     Right Ear: Tympanic membrane normal.     Left Ear: Tympanic membrane normal.     Nose: Nose normal.     Mouth/Throat:     Mouth: Mucous membranes are moist.  Eyes:     Pupils: Pupils are equal, round, and reactive to light.  Neck:     Vascular: No carotid bruit or JVD.  Cardiovascular:     Rate and Rhythm: Normal rate and regular rhythm.     Heart sounds: Normal heart sounds.  Pulmonary:     Effort: Pulmonary effort is normal. No respiratory distress.     Breath sounds: Normal breath sounds. No wheezing or rales.  Chest:     Chest wall: No tenderness.  Abdominal:      General: Bowel sounds are normal. There is no distension or abdominal bruit.     Palpations: Abdomen is soft. There is no hepatomegaly, splenomegaly, mass or pulsatile mass.     Tenderness: There is no abdominal tenderness.  Musculoskeletal:        General: Normal range of motion.     Cervical back: Normal range of motion and neck supple.     Right lower leg: Edema (1+ foot) present.     Left lower leg: Edema (1+  foot) present.     Comments: Crepitus of left knee with flexion and extension Mild left knee effusion  Lymphadenopathy:     Cervical: No cervical adenopathy.  Skin:    General: Skin is warm and dry.  Neurological:     Mental Status: She is alert and oriented to person, place, and time.     Deep Tendon Reflexes: Reflexes are normal and symmetric.  Psychiatric:        Behavior: Behavior normal.        Thought Content: Thought content normal.        Judgment: Judgment normal.    BP 125/65   Pulse 61   Temp (!) 97.4 F (36.3 C) (Temporal)   Resp 20   Ht '5\' 3"'  (1.6 m)   Wt 139 lb (63 kg)   SpO2 99%   BMI 24.62 kg/m   HGBA1c 6.1%     Assessment & Plan:   Lindsay Villarreal comes in today with chief complaint of Medical Management of Chronic Issues (Bilateral ankles swelling/)   Diagnosis and orders addressed:  1. Primary hypertension Low sodium diet - CBC with Differential/Platelet - CMP14+EGFR - lisinopril (ZESTRIL) 5 MG tablet; Take 1 tablet (5 mg total) by mouth daily.  Dispense: 90 tablet; Refill: 1  2. Mixed hyperlipidemia Low fat diet - Lipid panel - simvastatin (ZOCOR) 20 MG tablet; Take 1 tablet (20 mg total) by mouth daily.  Dispense: 90 tablet; Refill: 1  3. Type 2 diabetes mellitus with complication, without long-term current use of insulin (HCC) Continue to keep a check of blood pressure - Bayer DCA Hb A1c Waived - Microalbumin / creatinine urine ratio - metFORMIN (GLUCOPHAGE) 500 MG tablet; Take 1 tablet (500 mg total) by mouth 2 (two) times  daily.  Dispense: 180 tablet; Refill: 1  4. Diabetic autonomic neuropathy associated with type 2 diabetes mellitus ([place]) Do not go barefooted - gabapentin (NEURONTIN) 100 MG capsule; Take 1 capsule (100 mg total) by mouth 3 (three) times daily.  Dispense: 270 capsule; Refill: 1  5. Gastroesophageal reflux disease without esophagitis Avoid spicy foods Do not eat 2 hours prior to bedtime   6. Iron deficiency anemia, unspecified iron deficiency anemia type Labs pending - Ferrous Sulfate (IRON) 325 (65 Fe) MG TABS; Take 1 tablet (325 mg total) by mouth daily.  Dispense: 90 tablet; Refill: 1  7. Age-related osteoporosis without current pathological fracture Weight bearing exercises.  8. Left knee pain Referral to ortho   Labs pending Health Maintenance reviewed Diet and exercise encouraged  Follow up plan: 6 months   Mary-Margaret Hassell Done, FNP

## 2022-04-16 LAB — CBC WITH DIFFERENTIAL/PLATELET
Basophils Absolute: 0.1 10*3/uL (ref 0.0–0.2)
Basos: 1 %
EOS (ABSOLUTE): 0.1 10*3/uL (ref 0.0–0.4)
Eos: 2 %
Hematocrit: 37 % (ref 34.0–46.6)
Hemoglobin: 12.4 g/dL (ref 11.1–15.9)
Immature Grans (Abs): 0 10*3/uL (ref 0.0–0.1)
Immature Granulocytes: 0 %
Lymphocytes Absolute: 2.4 10*3/uL (ref 0.7–3.1)
Lymphs: 32 %
MCH: 30 pg (ref 26.6–33.0)
MCHC: 33.5 g/dL (ref 31.5–35.7)
MCV: 89 fL (ref 79–97)
Monocytes Absolute: 0.5 10*3/uL (ref 0.1–0.9)
Monocytes: 7 %
Neutrophils Absolute: 4.4 10*3/uL (ref 1.4–7.0)
Neutrophils: 58 %
Platelets: 293 10*3/uL (ref 150–450)
RBC: 4.14 x10E6/uL (ref 3.77–5.28)
RDW: 12 % (ref 11.7–15.4)
WBC: 7.5 10*3/uL (ref 3.4–10.8)

## 2022-04-16 LAB — LIPID PANEL
Chol/HDL Ratio: 2.9 ratio (ref 0.0–4.4)
Cholesterol, Total: 116 mg/dL (ref 100–199)
HDL: 40 mg/dL (ref >39)
LDL Chol Calc (NIH): 47 mg/dL (ref 0–99)
Triglycerides: 175 mg/dL — ABNORMAL HIGH (ref 0–149)
VLDL Cholesterol Cal: 29 mg/dL (ref 5–40)

## 2022-04-16 LAB — CMP14+EGFR
ALT: 13 IU/L (ref 0–32)
AST: 14 IU/L (ref 0–40)
Albumin/Globulin Ratio: 2 (ref 1.2–2.2)
Albumin: 3.8 g/dL (ref 3.7–4.7)
Alkaline Phosphatase: 46 IU/L (ref 44–121)
BUN/Creatinine Ratio: 20 (ref 12–28)
BUN: 12 mg/dL (ref 8–27)
Bilirubin Total: 0.3 mg/dL (ref 0.0–1.2)
CO2: 25 mmol/L (ref 20–29)
Calcium: 9.2 mg/dL (ref 8.7–10.3)
Chloride: 104 mmol/L (ref 96–106)
Creatinine, Ser: 0.61 mg/dL (ref 0.57–1.00)
Globulin, Total: 1.9 g/dL (ref 1.5–4.5)
Glucose: 119 mg/dL — ABNORMAL HIGH (ref 70–99)
Potassium: 4.5 mmol/L (ref 3.5–5.2)
Sodium: 141 mmol/L (ref 134–144)
Total Protein: 5.7 g/dL — ABNORMAL LOW (ref 6.0–8.5)
eGFR: 93 mL/min/{1.73_m2} (ref >59)

## 2022-04-16 LAB — MICROALBUMIN / CREATININE URINE RATIO
Creatinine, Urine: 112 mg/dL
Microalb/Creat Ratio: 15 mg/g creat (ref 0–29)
Microalbumin, Urine: 17.2 ug/mL

## 2022-04-22 ENCOUNTER — Ambulatory Visit: Payer: Medicare HMO | Admitting: Nurse Practitioner

## 2022-05-29 DIAGNOSIS — M25562 Pain in left knee: Secondary | ICD-10-CM | POA: Diagnosis not present

## 2022-06-02 ENCOUNTER — Telehealth: Payer: Self-pay | Admitting: Nurse Practitioner

## 2022-06-09 NOTE — Telephone Encounter (Signed)
Pt calling again about this message. Please call back [phone]

## 2022-06-10 NOTE — Telephone Encounter (Signed)
Ordered today as buy and bill = pt aware I will call her once I have it to sch her injection with triage

## 2022-06-11 NOTE — Telephone Encounter (Signed)
Appt made for injection - 06/12/22-jhb

## 2022-06-12 ENCOUNTER — Ambulatory Visit (INDEPENDENT_AMBULATORY_CARE_PROVIDER_SITE_OTHER): Payer: Medicare HMO

## 2022-06-12 DIAGNOSIS — M810 Age-related osteoporosis without current pathological fracture: Secondary | ICD-10-CM | POA: Diagnosis not present

## 2022-06-12 MED ORDER — DENOSUMAB 60 MG/ML ~~LOC~~ SOSY
60.0000 mg | PREFILLED_SYRINGE | Freq: Once | SUBCUTANEOUS | Status: AC
  Administered 2022-06-12: 60 mg via SUBCUTANEOUS

## 2022-06-12 NOTE — Progress Notes (Signed)
Prolia injection given to right arm.  Patient tolerated well.  Buy and bill.

## 2022-08-07 DIAGNOSIS — M1712 Unilateral primary osteoarthritis, left knee: Secondary | ICD-10-CM | POA: Diagnosis not present

## 2022-10-16 ENCOUNTER — Ambulatory Visit (INDEPENDENT_AMBULATORY_CARE_PROVIDER_SITE_OTHER): Payer: Medicare HMO

## 2022-10-16 ENCOUNTER — Encounter: Payer: Self-pay | Admitting: Nurse Practitioner

## 2022-10-16 ENCOUNTER — Ambulatory Visit (INDEPENDENT_AMBULATORY_CARE_PROVIDER_SITE_OTHER): Payer: Medicare HMO | Admitting: Nurse Practitioner

## 2022-10-16 ENCOUNTER — Telehealth: Payer: Self-pay | Admitting: Nurse Practitioner

## 2022-10-16 VITALS — BP 122/70 | HR 62 | Temp 97.7°F | Resp 20 | Ht 63.0 in | Wt 132.0 lb

## 2022-10-16 DIAGNOSIS — E782 Mixed hyperlipidemia: Secondary | ICD-10-CM

## 2022-10-16 DIAGNOSIS — M810 Age-related osteoporosis without current pathological fracture: Secondary | ICD-10-CM

## 2022-10-16 DIAGNOSIS — E118 Type 2 diabetes mellitus with unspecified complications: Secondary | ICD-10-CM

## 2022-10-16 DIAGNOSIS — D509 Iron deficiency anemia, unspecified: Secondary | ICD-10-CM

## 2022-10-16 DIAGNOSIS — I10 Essential (primary) hypertension: Secondary | ICD-10-CM | POA: Diagnosis not present

## 2022-10-16 DIAGNOSIS — Z0389 Encounter for observation for other suspected diseases and conditions ruled out: Secondary | ICD-10-CM | POA: Diagnosis not present

## 2022-10-16 DIAGNOSIS — Z23 Encounter for immunization: Secondary | ICD-10-CM | POA: Diagnosis not present

## 2022-10-16 DIAGNOSIS — M8589 Other specified disorders of bone density and structure, multiple sites: Secondary | ICD-10-CM | POA: Diagnosis not present

## 2022-10-16 DIAGNOSIS — K219 Gastro-esophageal reflux disease without esophagitis: Secondary | ICD-10-CM

## 2022-10-16 DIAGNOSIS — E1143 Type 2 diabetes mellitus with diabetic autonomic (poly)neuropathy: Secondary | ICD-10-CM | POA: Diagnosis not present

## 2022-10-16 DIAGNOSIS — Z780 Asymptomatic menopausal state: Secondary | ICD-10-CM | POA: Diagnosis not present

## 2022-10-16 LAB — BAYER DCA HB A1C WAIVED: HB A1C (BAYER DCA - WAIVED): 6.6 % — ABNORMAL HIGH (ref 4.8–5.6)

## 2022-10-16 MED ORDER — IRON 325 (65 FE) MG PO TABS: 1.0000 | ORAL_TABLET | Freq: Every day | ORAL | 1 refills | Status: DC

## 2022-10-16 MED ORDER — GABAPENTIN 100 MG PO CAPS: 100.0000 mg | ORAL_CAPSULE | Freq: Three times a day (TID) | ORAL | 1 refills | Status: DC

## 2022-10-16 MED ORDER — LISINOPRIL 5 MG PO TABS: 5.0000 mg | ORAL_TABLET | Freq: Every day | ORAL | 1 refills | Status: DC

## 2022-10-16 MED ORDER — SIMVASTATIN 20 MG PO TABS: 20.0000 mg | ORAL_TABLET | Freq: Every day | ORAL | 1 refills | Status: DC

## 2022-10-16 MED ORDER — METFORMIN HCL 500 MG PO TABS: 500.0000 mg | ORAL_TABLET | Freq: Two times a day (BID) | ORAL | 1 refills | Status: DC

## 2022-10-16 NOTE — Progress Notes (Addendum)
Subjective:    Patient ID: Lindsay Villarreal, female    DOB: March 07, 1946, 76 y.o.   MRN: [id number]   Chief Complaint: medical management of chronic issues     HPI:  Lindsay Villarreal is a 76 y.o. who identifies as a female who was assigned female at birth.   Social history: Lives with: husband Work history: retired   Scientist, forensic in today for follow up of the following chronic medical issues:  1. Primary hypertension No c/o chest pain, sob o headache. Does not check blood pressure at home. BP Readings from Last 3 Encounters:  04/15/22 125/65  10/22/21 119/60  09/17/21 124/66     2. Mixed hyperlipidemia Does not watch diet and does very little exercise. Lab Results  Component Value Date   CHOL 116 04/15/2022   HDL 40 04/15/2022   LDLCALC 47 04/15/2022   TRIG 175 (H) 04/15/2022   CHOLHDL 2.9 04/15/2022     3. Type 2 diabetes mellitus with complication, without long-term current use of insulin (HCC) Fasting blood sugars are running around 90-120. Denies any low blood sugars. Lab Results  Component Value Date   HGBA1C 6.1 (H) 04/15/2022     4. Diabetic autonomic neuropathy associated with type 2 diabetes mellitus (HCC) Has numbness and burning of bil feet. Denies any sores on feet.  5. Gastroesophageal reflux disease without esophagitis On no prescription meds for this. Only has symptoms occasionally  6. Iron deficiency anemia, unspecified iron deficiency anemia type No c/o fatigue Lab Results  Component Value Date   HGB 12.4 04/15/2022     7. Age-related osteoporosis without current pathological fracture Last dexascsn was done on 12/02/19. Her t score was -1.0   New complaints: None today  Allergies  Allergen Reactions   Codeine Other (See Comments)    Hallucinations.   Outpatient Encounter Medications as of 10/16/2022  Medication Sig   aspirin 81 MG tablet Take 81 mg by mouth at bedtime.   b complex vitamins capsule Take 1 capsule by mouth daily.   blood  glucose meter kit and supplies Dispense based on patient and insurance preference. Use up to four times daily as directed. Dx E11.8   calcium carbonate (OSCAL) 1500 (600 Ca) MG TABS tablet Take 600 mg of elemental calcium by mouth daily with breakfast.   cholecalciferol (VITAMIN D3) 25 MCG (1000 UNIT) tablet Take 1,000 Units by mouth daily.   denosumab (PROLIA) 60 MG/ML SOLN injection Inject 60 mg into the skin every 6 (six) months. Administer in upper arm, thigh, or abdomen   Ferrous Sulfate (IRON) 325 (65 Fe) MG TABS Take 1 tablet (325 mg total) by mouth daily.   gabapentin (NEURONTIN) 100 MG capsule Take 1 capsule (100 mg total) by mouth 3 (three) times daily.   glucose blood (ONETOUCH VERIO) test strip Test BS QID Dx E11.40   lisinopril (ZESTRIL) 5 MG tablet Take 1 tablet (5 mg total) by mouth daily.   metFORMIN (GLUCOPHAGE) 500 MG tablet Take 1 tablet (500 mg total) by mouth 2 (two) times daily.   Multiple Vitamins-Minerals (ZINC PO) Take 1 tablet by mouth daily.   OneTouch Delica Lancets 38G MISC USE ONE  TO CHECK GLUCOSE ONCE DAILY Dx E11.40   polyvinyl alcohol (LIQUIFILM TEARS) 1.4 % ophthalmic solution Place 1 drop into both eyes as needed for dry eyes.   simvastatin (ZOCOR) 20 MG tablet Take 1 tablet (20 mg total) by mouth daily.   No facility-administered encounter medications on file as of  10/16/2022.    Past Surgical History:  Procedure Laterality Date   APPENDECTOMY  11/17/1976   BLADDER REPAIR     CATARACT EXTRACTION W/ INTRAOCULAR LENS IMPLANT  2010 (left), 2013 (right)   COLONOSCOPY  04/30/2015   Dr.Perry   COLONOSCOPY  2013   EYE SURGERY     POLYPECTOMY     TUBAL LIGATION     VAGINAL HYSTERECTOMY  11/17/1976   XI ROBOTIC ASSISTED INGUINAL HERNIA REPAIR WITH MESH Bilateral 02/22/2021   Procedure: XI ROBOTIC ASSISTED BILATERAL INGUINAL HERNIA REPAIR;  Surgeon: Clovis Riley, MD;  Location: [facility];  Service: General;  Laterality: Bilateral;  2.5 HOURS    Family  History  Problem Relation Age of Onset   Heart attack Mother    Heart failure Mother    Stomach cancer Father    Pancreatic cancer Father    Liver cancer Father    Other Sister 58       Cancer of Bile Duct   Breast cancer Sister    Diabetes Sister    Brain cancer Sister 75   Sarcoidosis Sister    Colon cancer Neg Hx    Esophageal cancer Neg Hx    Rectal cancer Neg Hx    Colon polyps Neg Hx       Controlled substance contract: n/a     Review of Systems  Constitutional:  Negative for diaphoresis.  Eyes:  Negative for pain.  Respiratory:  Negative for shortness of breath.   Cardiovascular:  Negative for chest pain, palpitations and leg swelling.  Gastrointestinal:  Negative for abdominal pain.  Endocrine: Negative for polydipsia.  Skin:  Negative for rash.  Neurological:  Negative for dizziness, weakness and headaches.  Hematological:  Does not bruise/bleed easily.  All other systems reviewed and are negative.      Objective:   Physical Exam Vitals and nursing note reviewed.  Constitutional:      General: She is not in acute distress.    Appearance: Normal appearance. She is well-developed.  HENT:     Head: Normocephalic.     Right Ear: Tympanic membrane normal.     Left Ear: Tympanic membrane normal.     Nose: Nose normal.     Mouth/Throat:     Mouth: Mucous membranes are moist.  Eyes:     Pupils: Pupils are equal, round, and reactive to light.  Neck:     Vascular: No carotid bruit or JVD.  Cardiovascular:     Rate and Rhythm: Normal rate and regular rhythm.     Heart sounds: Normal heart sounds.  Pulmonary:     Effort: Pulmonary effort is normal. No respiratory distress.     Breath sounds: Normal breath sounds. No wheezing or rales.  Chest:     Chest wall: No tenderness.  Abdominal:     General: Bowel sounds are normal. There is no distension or abdominal bruit.     Palpations: Abdomen is soft. There is no hepatomegaly, splenomegaly, mass or pulsatile  mass.     Tenderness: There is no abdominal tenderness.  Musculoskeletal:        General: Normal range of motion.     Cervical back: Normal range of motion and neck supple.  Lymphadenopathy:     Cervical: No cervical adenopathy.  Skin:    General: Skin is warm and dry.  Neurological:     Mental Status: She is alert and oriented to person, place, and time.     Deep Tendon Reflexes:  Reflexes are normal and symmetric.  Psychiatric:        Behavior: Behavior normal.        Thought Content: Thought content normal.        Judgment: Judgment normal.     BP 122/70   Pulse 62   Temp 97.7 F (36.5 C) (Temporal)   Resp 20   Ht _0  (1.6 m)   Wt 132 lb (59.9 kg)   SpO2 100%   BMI 23.38 kg/m   HGBA1c 6.6%  EKG- sinus bradycardia at rate of Lindsay Potash, Lindsay Villarreal Chest xray clear    Assessment & Plan:   Lindsay Villarreal comes in today with chief complaint of Medical Management of Chronic Issues   Diagnosis and orders addressed:  1. Primary hypertension Low sodium diet - CBC with Differential/Platelet - CMP14+EGFR - DG Chest 2 View - EKG 12-Lead - lisinopril (ZESTRIL) 5 MG tablet; Take 1 tablet (5 mg total) by mouth daily.  Dispense: 90 tablet; Refill: 1  2. Mixed hyperlipidemia Low fta diet - Lipid panel - simvastatin (ZOCOR) 20 MG tablet; Take 1 tablet (20 mg total) by mouth daily.  Dispense: 90 tablet; Refill: 1  3. Type 2 diabetes mellitus with complication, without long-term current use of insulin (HCC) Continue to wathc carbs in diet - Bayer DCA Hb A1c Waived - metFORMIN (GLUCOPHAGE) 500 MG tablet; Take 1 tablet (500 mg total) by mouth 2 (two) times daily.  Dispense: 180 tablet; Refill: 1  4. Diabetic autonomic neuropathy associated with type 2 diabetes mellitus ([place]) Do not go barefooted - gabapentin (NEURONTIN) 100 MG capsule; Take 1 capsule (100 mg total) by mouth 3 (three) times daily.  Dispense: 270 capsule; Refill: 1  5. Gastroesophageal reflux  disease without esophagitis Avoid spicy foods Do not eat 2 hours prior to bedtime  6. Iron deficiency anemia, unspecified iron deficiency anemia type Abs pending Continue daily iron supplement - Ferrous Sulfate (IRON) 325 (65 Fe) MG TABS; Take 1 tablet (325 mg total) by mouth daily.  Dispense: 90 tablet; Refill: 1  7. Age-related osteoporosis without current pathological fracture Weight bearing exercise. - DG [facility] DEXA   Labs pending Health Maintenance reviewed Diet and exercise encouraged  Follow up plan: 6 months   [place], Lindsay Villarreal

## 2022-10-17 LAB — CMP14+EGFR
ALT: 10 IU/L (ref 0–32)
AST: 14 IU/L (ref 0–40)
Albumin/Globulin Ratio: 2.3 — ABNORMAL HIGH (ref 1.2–2.2)
Albumin: 4.2 g/dL (ref 3.8–4.8)
Alkaline Phosphatase: 52 IU/L (ref 44–121)
BUN/Creatinine Ratio: 22 (ref 12–28)
BUN: 14 mg/dL (ref 8–27)
Bilirubin Total: 0.4 mg/dL (ref 0.0–1.2)
CO2: 25 mmol/L (ref 20–29)
Calcium: 9.6 mg/dL (ref 8.7–10.3)
Chloride: 105 mmol/L (ref 96–106)
Creatinine, Ser: 0.63 mg/dL (ref 0.57–1.00)
Globulin, Total: 1.8 g/dL (ref 1.5–4.5)
Glucose: 138 mg/dL — ABNORMAL HIGH (ref 70–99)
Potassium: 3.8 mmol/L (ref 3.5–5.2)
Sodium: 145 mmol/L — ABNORMAL HIGH (ref 134–144)
Total Protein: 6 g/dL (ref 6.0–8.5)
eGFR: 92 mL/min/{1.73_m2} (ref >59)

## 2022-10-17 LAB — LIPID PANEL
Chol/HDL Ratio: 2.4 ratio (ref 0.0–4.4)
Cholesterol, Total: 116 mg/dL (ref 100–199)
HDL: 48 mg/dL (ref >39)
LDL Chol Calc (NIH): 45 mg/dL (ref 0–99)
Triglycerides: 128 mg/dL (ref 0–149)
VLDL Cholesterol Cal: 23 mg/dL (ref 5–40)

## 2022-10-17 LAB — CBC WITH DIFFERENTIAL/PLATELET
Basophils Absolute: 0.1 10*3/uL (ref 0.0–0.2)
Basos: 1 %
EOS (ABSOLUTE): 0.1 10*3/uL (ref 0.0–0.4)
Eos: 1 %
Hematocrit: 37.8 % (ref 34.0–46.6)
Hemoglobin: 12.7 g/dL (ref 11.1–15.9)
Immature Grans (Abs): 0 10*3/uL (ref 0.0–0.1)
Immature Granulocytes: 0 %
Lymphocytes Absolute: 2.3 10*3/uL (ref 0.7–3.1)
Lymphs: 31 %
MCH: 29.7 pg (ref 26.6–33.0)
MCHC: 33.6 g/dL (ref 31.5–35.7)
MCV: 88 fL (ref 79–97)
Monocytes Absolute: 0.5 10*3/uL (ref 0.1–0.9)
Monocytes: 7 %
Neutrophils Absolute: 4.4 10*3/uL (ref 1.4–7.0)
Neutrophils: 60 %
Platelets: 294 10*3/uL (ref 150–450)
RBC: 4.28 x10E6/uL (ref 3.77–5.28)
RDW: 12.2 % (ref 11.7–15.4)
WBC: 7.4 10*3/uL (ref 3.4–10.8)

## 2022-11-03 DIAGNOSIS — M1712 Unilateral primary osteoarthritis, left knee: Secondary | ICD-10-CM | POA: Diagnosis not present

## 2022-11-03 DIAGNOSIS — M25662 Stiffness of left knee, not elsewhere classified: Secondary | ICD-10-CM | POA: Diagnosis not present

## 2022-11-03 DIAGNOSIS — M25562 Pain in left knee: Secondary | ICD-10-CM | POA: Diagnosis not present

## 2022-11-03 NOTE — H&P (Signed)
TOTAL KNEE ADMISSION H&P  Patient is being admitted for left total knee arthroplasty.  Subjective:  Chief Complaint: Left knee pain.  HPI: Lindsay Villarreal, 76 y.o. female has a history of pain and functional disability in the left knee due to arthritis and has failed non-surgical conservative treatments for greater than 12 weeks to include corticosteriod injections and activity modification. Onset of symptoms was gradual, starting  several  years ago with gradually worsening course since that time. The patient noted no past surgery on the left knee.  Patient currently rates pain in the left knee at 8 out of 10 with activity. Patient has night pain, worsening of pain with activity and weight bearing, crepitus, and joint swelling. Patient has evidence of  bone-on-bone in the medial compartment and near bone-on-bone arthritis in the patellofemoral compartment with osteophyte formation, especially posteriorly  by imaging studies. There is no active infection.  Patient Active Problem List   Diagnosis Date Noted   Inguinal hernia of right side without obstruction or gangrene 07/12/2020   Heart murmur 07/12/2020   Inguinal hernia of left side without obstruction or gangrene 07/12/2020   Gastroesophageal reflux disease without esophagitis 10/27/2016   Diabetic neuropathy associated with type 2 diabetes mellitus ([place]) 02/12/2015   Type 2 diabetes mellitus with complication, without long-term current use of insulin ([place]) 06/02/2013   Hypertension 06/02/2013   Anemia 12/16/2011   Osteoporosis 11/26/2011   Hyperlipidemia 11/26/2011    Past Medical History:  Diagnosis Date   Anemia    Po iron   Arthritis    Bilateral cataracts    Essential hypertension    GERD (gastroesophageal reflux disease)    Heart murmur    History of kidney stones    Hyperlipidemia    Neuromuscular disorder ([place])    Osteoporosis    Type 2 diabetes mellitus ([place])     Past Surgical History:  Procedure Laterality Date    APPENDECTOMY  11/17/1976   BLADDER REPAIR     CATARACT EXTRACTION W/ INTRAOCULAR LENS IMPLANT  2010 (left), 2013 (right)   COLONOSCOPY  04/30/2015   Dr.Perry   COLONOSCOPY  2013   EYE SURGERY     POLYPECTOMY     TUBAL LIGATION     VAGINAL HYSTERECTOMY  11/17/1976   XI ROBOTIC ASSISTED INGUINAL HERNIA REPAIR WITH MESH Bilateral 02/22/2021   Procedure: XI ROBOTIC ASSISTED BILATERAL INGUINAL HERNIA REPAIR;  Surgeon: Clovis Riley, MD;  Location: [facility];  Service: General;  Laterality: Bilateral;  2.5 HOURS    Prior to Admission medications   Medication Sig Start Date End Date Taking? Authorizing Provider  aspirin 81 MG tablet Take 81 mg by mouth at bedtime.    [provider]  b complex vitamins capsule Take 1 capsule by mouth daily.    [provider]  blood glucose meter kit and supplies Dispense based on patient and insurance preference. Use up to four times daily as directed. Dx E11.8 10/31/21   [name], FNP  calcium carbonate (OSCAL) 1500 (600 Ca) MG TABS tablet Take 600 mg of elemental calcium by mouth daily with breakfast.    [provider]  cholecalciferol (VITAMIN D3) 25 MCG (1000 UNIT) tablet Take 1,000 Units by mouth daily.    [provider]  denosumab (PROLIA) 60 MG/ML SOLN injection Inject 60 mg into the skin every 6 (six) months. Administer in upper arm, thigh, or abdomen 02/13/16   Eckard, Tammy, RPH-CPP  Ferrous Sulfate (IRON) 325 (65 Fe) MG TABS  Take 1 tablet (325 mg total) by mouth daily. 10/16/22   Hassell Done, Mary-Margaret, FNP  gabapentin (NEURONTIN) 100 MG capsule Take 1 capsule (100 mg total) by mouth 3 (three) times daily. 10/16/22   Hassell Done Mary-Margaret, FNP  glucose blood [facility] VERIO) test strip Test BS QID Dx E11.40 03/13/22   Hassell Done, Mary-Margaret, FNP  lisinopril (ZESTRIL) 5 MG tablet Take 1 tablet (5 mg total) by mouth daily. 10/16/22   Hassell Done, Mary-Margaret, FNP  metFORMIN (GLUCOPHAGE) 500 MG tablet Take 1  tablet (500 mg total) by mouth 2 (two) times daily. 10/16/22   Hassell Done Mary-Margaret, FNP  Multiple Vitamins-Minerals (ZINC PO) Take 1 tablet by mouth daily.    [provider]  OneTouch Delica Lancets 04H MISC USE ONE  TO CHECK GLUCOSE ONCE DAILY Dx E11.40 10/10/19   Hassell Done, Mary-Margaret, FNP  polyvinyl alcohol (LIQUIFILM TEARS) 1.4 % ophthalmic solution Place 1 drop into both eyes as needed for dry eyes.    [provider]  simvastatin (ZOCOR) 20 MG tablet Take 1 tablet (20 mg total) by mouth daily. 10/16/22   [name], FNP    Allergies  Allergen Reactions   Codeine Other (See Comments)    Hallucinations.    Social History   Socioeconomic History   Marital status: Divorced    Spouse name: Not on file   Number of children: 3   Years of education: 12   Highest education level: High school graduate  Occupational History   Occupation: Teacher, English as a foreign language, Paediatric nurse    Comment: retired  Tobacco Use   Smoking status: Every Day    Packs/day: 0.50    Years: 50.00    Total pack years: 25.00    Types: Cigarettes   Smokeless tobacco: Never  Vaping Use   Vaping Use: Never used  Substance and Sexual Activity   Alcohol use: No   Drug use: No   Sexual activity: Not Currently  Other Topics Concern   Not on file  Social History Narrative   Lives alone - one level. Children live close by   Social Determinants of Health   Financial Resource Strain: Low Risk  (09/03/2021)   Overall Financial Resource Strain (CARDIA)    Difficulty of Paying Living Expenses: Not very hard  Food Insecurity: No Food Insecurity (09/03/2021)   Hunger Vital Sign    Worried About Running Out of Food in the Last Year: Never true    Ran Out of Food in the Last Year: Never true  Transportation Needs: No Transportation Needs (09/03/2021)   PRAPARE - Hydrologist (Medical): No    Lack of Transportation (Non-Medical): No  Physical Activity: Inactive  (09/03/2021)   Exercise Vital Sign    Days of Exercise per Week: 0 days    Minutes of Exercise per Session: 0 min  Stress: No Stress Concern Present (09/03/2021)   [place]    Feeling of Stress : Not at all  Social Connections: Socially Isolated (09/03/2021)   Social Connection and Isolation Panel [NHANES]    Frequency of Communication with Friends and Family: More than three times a week    Frequency of Social Gatherings with Friends and Family: More than three times a week    Attends Religious Services: Never    Marine scientist or Organizations: No    Attends Archivist Meetings: Never    Marital Status: Divorced  Human resources officer Violence: Not At Risk (09/03/2021)  Humiliation, Afraid, Rape, and Kick questionnaire    Fear of Current or Ex-Partner: No    Emotionally Abused: No    Physically Abused: No    Sexually Abused: No    Tobacco Use: High Risk (10/16/2022)   Patient History    Smoking Tobacco Use: Every Day    Smokeless Tobacco Use: Never    Passive Exposure: Not on file   Social History   Substance and Sexual Activity  Alcohol Use No    Family History  Problem Relation Age of Onset   Heart attack Mother    Heart failure Mother    Stomach cancer Father    Pancreatic cancer Father    Liver cancer Father    Other Sister 62       Cancer of Bile Duct   Breast cancer Sister    Diabetes Sister    Brain cancer Sister 28   Sarcoidosis Sister    Colon cancer Neg Hx    Esophageal cancer Neg Hx    Rectal cancer Neg Hx    Colon polyps Neg Hx     Review of Systems  Constitutional:  Negative for chills and fever.  HENT:  Negative for congestion, sore throat and tinnitus.   Eyes:  Negative for double vision, photophobia and pain.  Respiratory:  Negative for cough, shortness of breath and wheezing.   Cardiovascular:  Negative for chest pain, palpitations and orthopnea.   Gastrointestinal:  Negative for heartburn, nausea and vomiting.  Genitourinary:  Negative for dysuria, frequency and urgency.  Musculoskeletal:  Positive for joint pain.  Neurological:  Negative for dizziness, weakness and headaches.    Objective:  Physical Exam: Well nourished and well developed.  General: Alert and oriented x3, cooperative and pleasant, no acute distress.  Head: normocephalic, atraumatic, neck supple.  Eyes: EOMI.  Musculoskeletal:  Left Knee Exam: Slight varus deformity. No effusion present. No swelling present. The Range of motion is: 0 to 125 degrees. Moderate crepitus on range of motion of the knee. Positive medial joint line tenderness. No lateral joint line tenderness. The knee is stable.  Calves soft and nontender. Motor function intact in LE. Strength 5/5 LE bilaterally. Neuro: Distal pulses 2+. Sensation to light touch intact in LE.   Imaging Review Plain radiographs demonstrate severe degenerative joint disease of the left knee. The overall alignment is mild varus. The bone quality appears to be adequate for age and reported activity level.  Assessment/Plan:  End stage arthritis, left knee   The patient history, physical examination, clinical judgment of the provider and imaging studies are consistent with end stage degenerative joint disease of the left knee and total knee arthroplasty is deemed medically necessary. The treatment options including medical management, injection therapy arthroscopy and arthroplasty were discussed at length. The risks and benefits of total knee arthroplasty were presented and reviewed. The risks due to aseptic loosening, infection, stiffness, patella tracking problems, thromboembolic complications and other imponderables were discussed. The patient acknowledged the explanation, agreed to proceed with the plan and consent was signed. Patient is being admitted for inpatient treatment for surgery, pain control, PT, OT,  prophylactic antibiotics, VTE prophylaxis, progressive ambulation and ADLs and discharge planning. The patient is planning to be discharged  home .   Patient's anticipated LOS is less than 2 midnights, meeting these requirements: - Lives within 1 hour of care - Has a competent adult at home to recover with post-op recover - NO history of  - Chronic pain requiring opiods  -  Coronary Artery Disease  - Heart failure  - Heart attack  - Stroke  - DVT/VTE  - Cardiac arrhythmia  - Respiratory Failure/COPD  - Renal failure  - Anemia  - Advanced Liver disease  Therapy Plans: Outpatient therapy at [facility]) Disposition: Home with daughter Planned DVT Prophylaxis: Aspirin 325 mg BID DME Needed: Gilford Rile PCP: [name], FNP (clearance received) TXA: IV Allergies: Codeine  Anesthesia Concerns: None BMI: 23.2 Last HgbA1c: 6.6% (10/16/22) Pharmacy: [facility])  Other: - Tolerated oxycodone in 2022  - Patient was instructed on what medications to stop prior to surgery. - Follow-up visit in 2 weeks with Dr. Wynelle Link - Begin physical therapy following surgery - Pre-operative lab work as pre-surgical testing - Prescriptions will be provided in hospital at time of discharge  Theresa Duty, PA-C Orthopedic Surgery [facility] [place]

## 2022-11-19 NOTE — Patient Instructions (Addendum)
SURGICAL WAITING ROOM VISITATION Patients having surgery or a procedure may have no more than 2 support people in the waiting area - these visitors may rotate.    If the patient needs to stay at the hospital during part of their recovery, the visitor guidelines for inpatient rooms apply. Pre-op nurse will coordinate an appropriate time for 1 support person to accompany patient in pre-op.  This support person may not rotate.    Please refer to the [facility] website for the visitor guidelines for Inpatients (after your surgery is over and you are in a regular room).   Due to an increase in RSV and influenza rates and associated hospitalizations, children ages 37 and under may not visit patients in [place].     Your procedure is scheduled on: 12-01-21   Report to [facility] Main Entrance    Report to admitting at 5:45 AM   Call this number if you have problems the morning of surgery [phone]   Do not eat food :After Midnight.   After Midnight you may have the following liquids until 5:15 AM DAY OF SURGERY  Water Non-Citrus Juices (without pulp, NO RED) Carbonated Beverages Black Coffee (NO MILK/CREAM OR CREAMERS, sugar ok)  Clear Tea (NO MILK/CREAM OR CREAMERS, sugar ok) regular and decaf                             Plain Jell-O (NO RED)                                           Fruit ices (not with fruit pulp, NO RED)                                     Popsicles (NO RED)                                                               Sports drinks like Gatorade (NO RED)                   The day of surgery:  Drink ONE (1) Pre-Surgery Clear G2 at 5:15 AM the morning of surgery. Drink in one sitting. Do not sip.  This drink was given to you during your hospital  pre-op appointment visit. Nothing else to drink after completing the Pre-Surgery G2.          If you have questions, please contact your surgeon's office.   FOLLOW  ANY ADDITIONAL PRE OP  INSTRUCTIONS YOU RECEIVED FROM YOUR SURGEON'S OFFICE!!!     Oral Hygiene is also important to reduce your risk of infection.                                    Remember - BRUSH YOUR TEETH THE MORNING OF SURGERY WITH YOUR REGULAR TOOTHPASTE   Do NOT smoke after Midnight   Take these medicines the morning of surgery with A SIP OF WATER:   Gabapentin  Simvastatin  How to Manage Your Diabetes Before and After Surgery  Why is it important to control my blood sugar before and after surgery? Improving blood sugar levels before and after surgery helps healing and can limit problems. A way of improving blood sugar control is eating a healthy diet by:  Eating less sugar and carbohydrates  Increasing activity/exercise  Talking with your doctor about reaching your blood sugar goals High blood sugars (greater than 180 mg/dL) can raise your risk of infections and slow your recovery, so you will need to focus on controlling your diabetes during the weeks before surgery. Make sure that the doctor who takes care of your diabetes knows about your planned surgery including the date and location.  How do I manage my blood sugar before surgery? Check your blood sugar at least 4 times a day, starting 2 days before surgery, to make sure that the level is not too high or low. Check your blood sugar the morning of your surgery when you wake up and every 2 hours until you get to the Short Stay unit. If your blood sugar is less than 70 mg/dL, you will need to treat for low blood sugar: Do not take insulin. Treat a low blood sugar (less than 70 mg/dL) with  cup of clear juice (cranberry or apple), 4 glucose tablets, OR glucose gel. Recheck blood sugar in 15 minutes after treatment (to make sure it is greater than 70 mg/dL). If your blood sugar is not greater than 70 mg/dL on recheck, call [phone] for further instructions. Report your blood sugar to the short stay nurse when you get to Short Stay.  If you  are admitted to the hospital after surgery: Your blood sugar will be checked by the staff and you will probably be given insulin after surgery (instead of oral diabetes medicines) to make sure you have good blood sugar levels. The goal for blood sugar control after surgery is 80-180 mg/dL.   WHAT DO I DO ABOUT MY DIABETES MEDICATION?  Do not take oral diabetes medicines (pills) the morning of surgery.  DO NOT TAKE THE FOLLOWING 7 DAYS PRIOR TO SURGERY: Ozempic, Wegovy, Rybelsus (Semaglutide), Byetta (exenatide), Bydureon (exenatide ER), Victoza, Saxenda (liraglutide), or Trulicity (dulaglutide) Mounjaro (Tirzepatide) Adlyxin (Lixisenatide), Polyethylene Glycol Loxenatide.  Reviewed and Endorsed by [facility] Patient Education Committee, August 2015                              You may not have any metal on your body including hair pins, jewelry, and body piercing             Do not wear make-up, lotions, powders, perfumes or deodorant  Do not wear nail polish including gel and S&S, artificial/acrylic nails, or any other type of covering on natural nails including finger and toenails. If you have artificial nails, gel coating, etc. that needs to be removed by a nail salon please have this removed prior to surgery or surgery may need to be canceled/ delayed if the surgeon/ anesthesia feels like they are unable to be safely monitored.   Do not shave  48 hours prior to surgery.    Do not bring valuables to the hospital. [place].   Contacts, dentures or bridgework may not be worn into surgery.   Bring small overnight bag day of surgery.   DO NOT BRING YOUR HOME MEDICATIONS TO THE  HOSPITAL. PHARMACY WILL DISPENSE MEDICATIONS LISTED ON YOUR MEDICATION LIST TO YOU DURING YOUR ADMISSION [place]!   Special Instructions: Bring a copy of your healthcare power of attorney and living will documents the day of surgery if you haven't scanned them  before.              Please read over the following fact sheets you were given: IF [place]  If you received a COVID test during your pre-op visit  it is requested that you wear a mask when out in public, stay away from anyone that may not be feeling well and notify your surgeon if you develop symptoms. If you test positive for Covid or have been in contact with anyone that has tested positive in the last 10 days please notify you surgeon.  [place] - Preparing for Surgery Before surgery, you can play an important role.  Because skin is not sterile, your skin needs to be as free of germs as possible.  You can reduce the number of germs on your skin by washing with CHG (chlorahexidine gluconate) soap before surgery.  CHG is an antiseptic cleaner which kills germs and bonds with the skin to continue killing germs even after washing. Please DO NOT use if you have an allergy to CHG or antibacterial soaps.  If your skin becomes reddened/irritated stop using the CHG and inform your nurse when you arrive at Short Stay. Do not shave (including legs and underarms) for at least 48 hours prior to the first CHG shower.  You may shave your face/neck.  Please follow these instructions carefully:  1.  Shower with CHG Soap the night before surgery and the  morning of surgery.  2.  If you choose to wash your hair, wash your hair first as usual with your normal  shampoo.  3.  After you shampoo, rinse your hair and body thoroughly to remove the shampoo.                             4.  Use CHG as you would any other liquid soap.  You can apply chg directly to the skin and wash.  Gently with a scrungie or clean washcloth.  5.  Apply the CHG Soap to your body ONLY FROM THE NECK DOWN.   Do   not use on face/ open                           Wound or open sores. Avoid contact with eyes, ears mouth and   genitals (private parts).                        Wash face,  Genitals (private parts) with your normal soap.             6.  Wash thoroughly, paying special attention to the area where your    surgery  will be performed.  7.  Thoroughly rinse your body with warm water from the neck down.  8.  DO NOT shower/wash with your normal soap after using and rinsing off the CHG Soap.                9.  Pat yourself dry with a clean towel.            10.  Wear  clean pajamas.            11.  Place clean sheets on your bed the night of your first shower and do not  sleep with pets. Day of Surgery : Do not apply any lotions/deodorants the morning of surgery.  Please wear clean clothes to the hospital/surgery center.  FAILURE TO FOLLOW THESE INSTRUCTIONS MAY RESULT IN THE CANCELLATION OF YOUR SURGERY  PATIENT SIGNATURE_________________________________  NURSE SIGNATURE__________________________________  ________________________________________________________________________    Adam [place]ix  An incentive spirometer is a tool that can help keep your lungs clear and active. This tool measures how well you are filling your lungs with each breath. Taking long deep breaths may help reverse or decrease the chance of developing breathing (pulmonary) problems (especially infection) following: A long period of time when you are unable to move or be active. BEFORE THE PROCEDURE  If the spirometer includes an indicator to show your best effort, your nurse or respiratory therapist will set it to a desired goal. If possible, sit up straight or lean slightly forward. Try not to slouch. Hold the incentive spirometer in an upright position. INSTRUCTIONS FOR USE  Sit on the edge of your bed if possible, or sit up as far as you can in bed or on a chair. Hold the incentive spirometer in an upright position. Breathe out normally. Place the mouthpiece in your mouth and seal your lips tightly around it. Breathe in slowly and as deeply as possible, raising the  piston or the ball toward the top of the column. Hold your breath for 3-5 seconds or for as long as possible. Allow the piston or ball to fall to the bottom of the column. Remove the mouthpiece from your mouth and breathe out normally. Rest for a few seconds and repeat Steps 1 through 7 at least 10 times every 1-2 hours when you are awake. Take your time and take a few normal breaths between deep breaths. The spirometer may include an indicator to show your best effort. Use the indicator as a goal to work toward during each repetition. After each set of 10 deep breaths, practice coughing to be sure your lungs are clear. If you have an incision (the cut made at the time of surgery), support your incision when coughing by placing a pillow or rolled up towels firmly against it. Once you are able to get out of bed, walk around indoors and cough well. You may stop using the incentive spirometer when instructed by your caregiver.  RISKS AND COMPLICATIONS Take your time so you do not get dizzy or light-headed. If you are in pain, you may need to take or ask for pain medication before doing incentive spirometry. It is harder to take a deep breath if you are having pain. AFTER USE Rest and breathe slowly and easily. It can be helpful to keep track of a log of your progress. Your caregiver can provide you with a simple table to help with this. If you are using the spirometer at home, follow these instructions: [place] IF:  You are having difficultly using the spirometer. You have trouble using the spirometer as often as instructed. Your pain medication is not giving enough relief while using the spirometer. You develop fever of 100.5 F (38.1 C) or higher. SEEK IMMEDIATE MEDICAL CARE IF:  You cough up bloody sputum that had not been present before. You develop fever of 102 F (38.9 C) or greater. You develop worsening pain at or near the  incision site. MAKE SURE YOU:  Understand these  instructions. Will watch your condition. Will get help right away if you are not doing well or get worse. Document Released: 03/16/2007 Document Revised: 01/26/2012 Document Reviewed: 05/17/2007 [facility] Patient Information 2014 [place], [place].   ________________________________________________________________________

## 2022-11-19 NOTE — Progress Notes (Addendum)
COVID Vaccine Completed:  Yes  Date of COVID positive in last 90 days:  No  PCP - Mary-Margaret Hassell Done, FNP Cardiologist - Rozann Lesches, MD  Medical clearance on  chart dated 09-01-22 by Mary-Margaret Hassell Done.  Chest x-ray - 10-16-22 Epic EKG - 10-16-22 Epic Stress Test - 10+ years ago ECHO - 11-02-20 Epic Cardiac Cath - N/A Pacemaker/ICD device last checked: Spinal Cord Stimulator: N/A  Bowel Prep -  N/A  Sleep Study -  N/A CPAP -   Fasting Blood Sugar - 105 to 120 Checks Blood Sugar - 1 time a day  Last dose of GLP1 agonist-  N/A GLP1 instructions:  N/A   Last dose of SGLT-2 inhibitors-  N/A SGLT-2 instructions: N/A  Blood Thinner Instructions: Aspirin Instructions:  ASA 81.  To stop one week prior to surgery per patient Last Dose:  Activity level:  Can go up a flight of stairs and perform activities of daily living without stopping and without symptoms of chest pain or shortness of breath.  Anesthesia review:  Murmur eval by cardiology, HTN, DM  Patient denies shortness of breath, fever, cough and chest pain at PAT appointment  Patient verbalized understanding of instructions that were given to them at the PAT appointment. Patient was also instructed that they will need to review over the PAT instructions again at home before surgery.

## 2022-11-21 ENCOUNTER — Encounter (HOSPITAL_COMMUNITY): Payer: Self-pay

## 2022-11-21 ENCOUNTER — Other Ambulatory Visit: Payer: Self-pay

## 2022-11-21 ENCOUNTER — Encounter (HOSPITAL_COMMUNITY)
Admission: RE | Admit: 2022-11-21 | Discharge: 2022-11-21 | Disposition: A | Discharge status: Home / Self Care | Payer: Medicare HMO | Source: Ambulatory Visit | Attending: Orthopedic Surgery | Admitting: Orthopedic Surgery

## 2022-11-21 VITALS — BP 137/66 | HR 60 | Temp 98.9°F | Resp 16 | Ht 64.5 in | Wt 132.2 lb

## 2022-11-21 DIAGNOSIS — E119 Type 2 diabetes mellitus without complications: Secondary | ICD-10-CM

## 2022-11-21 DIAGNOSIS — Z01812 Encounter for preprocedural laboratory examination: Secondary | ICD-10-CM | POA: Insufficient documentation

## 2022-11-21 DIAGNOSIS — Z01818 Encounter for other preprocedural examination: Secondary | ICD-10-CM

## 2022-11-21 DIAGNOSIS — I2510 Atherosclerotic heart disease of native coronary artery without angina pectoris: Secondary | ICD-10-CM

## 2022-11-21 LAB — BASIC METABOLIC PANEL
Anion gap: 8 (ref 5–15)
BUN: 18 mg/dL (ref 8–23)
CO2: 25 mmol/L (ref 22–32)
Calcium: 9 mg/dL (ref 8.9–10.3)
Chloride: 105 mmol/L (ref 98–111)
Creatinine, Ser: 0.67 mg/dL (ref 0.44–1.00)
GFR, Estimated: >60 mL/min (ref >60)
Glucose, Bld: 123 mg/dL — ABNORMAL HIGH (ref 70–99)
Potassium: 3.8 mmol/L (ref 3.5–5.1)
Sodium: 138 mmol/L (ref 135–145)

## 2022-11-21 LAB — CBC
HCT: 38.7 % (ref 36.0–46.0)
Hemoglobin: 12.5 g/dL (ref 12.0–15.0)
MCH: 30 pg (ref 26.0–34.0)
MCHC: 32.3 g/dL (ref 30.0–36.0)
MCV: 92.8 fL (ref 80.0–100.0)
Platelets: 284 10*3/uL (ref 150–400)
RBC: 4.17 MIL/uL (ref 3.87–5.11)
RDW: 12.8 % (ref 11.5–15.5)
WBC: 8.8 10*3/uL (ref 4.0–10.5)
nRBC: 0 % (ref 0.0–0.2)

## 2022-11-21 LAB — GLUCOSE, CAPILLARY: Glucose-Capillary: 124 mg/dL — ABNORMAL HIGH (ref 70–99)

## 2022-11-21 LAB — SURGICAL PCR SCREEN
MRSA, PCR: NEGATIVE
Staphylococcus aureus: NEGATIVE

## 2022-11-29 ENCOUNTER — Encounter (HOSPITAL_COMMUNITY): Payer: Self-pay | Admitting: Orthopedic Surgery

## 2022-11-30 ENCOUNTER — Encounter (HOSPITAL_COMMUNITY): Payer: Self-pay | Admitting: Orthopedic Surgery

## 2022-11-30 NOTE — Anesthesia Preprocedure Evaluation (Signed)
Anesthesia Evaluation  Patient identified by MRN, date of birth, ID band Patient awake    Reviewed: Allergy & Precautions, NPO status , Patient's Chart, lab work & pertinent test results  Airway Mallampati: II  TM Distance: >3 FB Neck ROM: Full    Dental no notable dental hx. (+) Dental Advisory Given, Teeth Intact   Pulmonary Current Smoker and Patient abstained from smoking.   Pulmonary exam normal breath sounds clear to auscultation       Cardiovascular hypertension, + Valvular Problems/Murmurs  Rhythm:Regular Rate:Normal + Systolic murmurs Echo 52/7782  1. Left ventricular ejection fraction, by estimation, is 65 to 70%. The left ventricle has normal function. The left ventricle has no regional wall motion abnormalities. There is mild left ventricular hypertrophy. Left ventricular diastolic parameters are consistent with Grade I diastolic dysfunction (impaired relaxation).   2. Right ventricular systolic function is normal. The right ventricular size is normal. There is normal pulmonary artery systolic pressure.   3. The mitral valve is normal in structure. Mild mitral valve regurgitation. No evidence of mitral stenosis.   4. Tricuspid valve regurgitation is mild to moderate.   5. The aortic valve is tricuspid. There is mild calcification of the aortic valve. There is mild thickening of the aortic valve. Aortic valve regurgitation is not visualized. No aortic stenosis is present.   6. The inferior vena cava is normal in size with greater than 50% respiratory variability, suggesting right atrial pressure of 3 mmHg.     Neuro/Psych    GI/Hepatic Neg liver ROS,GERD  ,,  Endo/Other  diabetes    Renal/GU negative Renal ROS     Musculoskeletal  (+) Arthritis ,    Abdominal   Peds  Hematology  (+) Blood dyscrasia, anemia   Anesthesia Other Findings   Reproductive/Obstetrics                              Anesthesia Physical Anesthesia Plan  ASA: 3  Anesthesia Plan: Spinal   Post-op Pain Management: Regional block*, Tylenol PO (pre-op)* and Celebrex PO (pre-op)*   Induction: Intravenous  PONV Risk Score and Plan: 1 and Ondansetron, Dexamethasone, Treatment may vary due to age or medical condition, Propofol infusion and TIVA  Airway Management Planned: Natural Airway  Additional Equipment:   Intra-op Plan:   Post-operative Plan:   Informed Consent: I have reviewed the patients History and Physical, chart, labs and discussed the procedure including the risks, benefits and alternatives for the proposed anesthesia with the patient or authorized representative who has indicated his/her understanding and acceptance.     Dental advisory given  Plan Discussed with: CRNA  Anesthesia Plan Comments:        Anesthesia Quick Evaluation

## 2022-12-01 ENCOUNTER — Other Ambulatory Visit: Payer: Self-pay

## 2022-12-01 ENCOUNTER — Ambulatory Visit (HOSPITAL_BASED_OUTPATIENT_CLINIC_OR_DEPARTMENT_OTHER): Payer: Medicare HMO | Admitting: Anesthesiology

## 2022-12-01 ENCOUNTER — Encounter (HOSPITAL_COMMUNITY)
Admission: RE | Disposition: A | Discharge status: Home / Self Care | Payer: Self-pay | Source: Ambulatory Visit | Attending: Orthopedic Surgery

## 2022-12-01 ENCOUNTER — Ambulatory Visit (HOSPITAL_COMMUNITY): Payer: Medicare HMO | Admitting: Physician Assistant

## 2022-12-01 ENCOUNTER — Observation Stay (HOSPITAL_COMMUNITY)
Admission: RE | Admit: 2022-12-01 | Discharge: 2022-12-02 | Disposition: A | Discharge status: Home / Self Care | Payer: Medicare HMO | Source: Ambulatory Visit | Attending: Orthopedic Surgery | Admitting: Orthopedic Surgery

## 2022-12-01 ENCOUNTER — Encounter (HOSPITAL_COMMUNITY): Payer: Self-pay | Admitting: Orthopedic Surgery

## 2022-12-01 DIAGNOSIS — G8918 Other acute postprocedural pain: Secondary | ICD-10-CM | POA: Diagnosis not present

## 2022-12-01 DIAGNOSIS — Z7982 Long term (current) use of aspirin: Secondary | ICD-10-CM | POA: Insufficient documentation

## 2022-12-01 DIAGNOSIS — E1140 Type 2 diabetes mellitus with diabetic neuropathy, unspecified: Secondary | ICD-10-CM | POA: Diagnosis not present

## 2022-12-01 DIAGNOSIS — F17210 Nicotine dependence, cigarettes, uncomplicated: Secondary | ICD-10-CM | POA: Insufficient documentation

## 2022-12-01 DIAGNOSIS — Z7984 Long term (current) use of oral hypoglycemic drugs: Secondary | ICD-10-CM

## 2022-12-01 DIAGNOSIS — M179 Osteoarthritis of knee, unspecified: Secondary | ICD-10-CM

## 2022-12-01 DIAGNOSIS — M1712 Unilateral primary osteoarthritis, left knee: Principal | ICD-10-CM

## 2022-12-01 DIAGNOSIS — E119 Type 2 diabetes mellitus without complications: Secondary | ICD-10-CM

## 2022-12-01 DIAGNOSIS — I10 Essential (primary) hypertension: Secondary | ICD-10-CM | POA: Diagnosis not present

## 2022-12-01 DIAGNOSIS — Z79899 Other long term (current) drug therapy: Secondary | ICD-10-CM | POA: Diagnosis not present

## 2022-12-01 DIAGNOSIS — R69 Illness, unspecified: Secondary | ICD-10-CM | POA: Diagnosis not present

## 2022-12-01 HISTORY — PX: TOTAL KNEE ARTHROPLASTY: SHX125

## 2022-12-01 LAB — GLUCOSE, CAPILLARY
Glucose-Capillary: 120 mg/dL — ABNORMAL HIGH (ref 70–99)
Glucose-Capillary: 131 mg/dL — ABNORMAL HIGH (ref 70–99)

## 2022-12-01 SURGERY — ARTHROPLASTY, KNEE, TOTAL
Anesthesia: Spinal | Site: Knee | Laterality: Left

## 2022-12-01 MED ORDER — LACTATED RINGERS IV SOLN: INTRAVENOUS | Status: DC

## 2022-12-01 MED ORDER — SODIUM CHLORIDE 0.9 % IR SOLN
Status: DC | PRN
  Administered 2022-12-01 (×2): 1000 mL

## 2022-12-01 MED ORDER — FENTANYL CITRATE (PF) 100 MCG/2ML IJ SOLN
INTRAMUSCULAR | Status: DC | PRN
  Administered 2022-12-01: 50 ug via INTRAVENOUS

## 2022-12-01 MED ORDER — PROPOFOL 1000 MG/100ML IV EMUL
INTRAVENOUS | Status: AC
  Filled 2022-12-01: qty 100

## 2022-12-01 MED ORDER — HYDROMORPHONE HCL 1 MG/ML IJ SOLN
0.2500 mg | INTRAMUSCULAR | Status: DC | PRN
  Administered 2022-12-01: 0.5 mg via INTRAVENOUS

## 2022-12-01 MED ORDER — EPHEDRINE 5 MG/ML INJ
INTRAVENOUS | Status: AC
  Filled 2022-12-01: qty 5

## 2022-12-01 MED ORDER — DEXAMETHASONE SODIUM PHOSPHATE 10 MG/ML IJ SOLN
10.0000 mg | Freq: Once | INTRAMUSCULAR | Status: AC
  Administered 2022-12-02: 10 mg via INTRAVENOUS
  Filled 2022-12-01: qty 1

## 2022-12-01 MED ORDER — METHOCARBAMOL 500 MG IVPB - SIMPLE MED
INTRAVENOUS | Status: AC
  Filled 2022-12-01: qty 55

## 2022-12-01 MED ORDER — ORAL CARE MOUTH RINSE: 15.0000 mL | Freq: Once | OROMUCOSAL | Status: AC

## 2022-12-01 MED ORDER — SIMVASTATIN 20 MG PO TABS
20.0000 mg | ORAL_TABLET | Freq: Every day | ORAL | Status: DC
  Administered 2022-12-02: 20 mg via ORAL
  Filled 2022-12-01: qty 1

## 2022-12-01 MED ORDER — HYDROMORPHONE HCL 1 MG/ML IJ SOLN
INTRAMUSCULAR | Status: AC
  Filled 2022-12-01: qty 1

## 2022-12-01 MED ORDER — CLONIDINE HCL (ANALGESIA) 100 MCG/ML EP SOLN
EPIDURAL | Status: DC | PRN
  Administered 2022-12-01: 80 ug

## 2022-12-01 MED ORDER — TRANEXAMIC ACID-NACL 1000-0.7 MG/100ML-% IV SOLN
1000.0000 mg | INTRAVENOUS | Status: AC
  Administered 2022-12-01: 1000 mg via INTRAVENOUS
  Filled 2022-12-01: qty 100

## 2022-12-01 MED ORDER — ASPIRIN 325 MG PO TBEC
325.0000 mg | DELAYED_RELEASE_TABLET | Freq: Two times a day (BID) | ORAL | Status: DC
  Administered 2022-12-01 – 2022-12-02 (×3): 325 mg via ORAL
  Filled 2022-12-01 (×3): qty 1

## 2022-12-01 MED ORDER — FLEET ENEMA 7-19 GM/118ML RE ENEM: 1.0000 | ENEMA | Freq: Once | RECTAL | Status: DC | PRN

## 2022-12-01 MED ORDER — PROMETHAZINE HCL 25 MG/ML IJ SOLN: 6.2500 mg | INTRAMUSCULAR | Status: DC | PRN

## 2022-12-01 MED ORDER — CEFAZOLIN SODIUM-DEXTROSE 2-4 GM/100ML-% IV SOLN
2.0000 g | Freq: Four times a day (QID) | INTRAVENOUS | Status: AC
  Administered 2022-12-01 (×2): 2 g via INTRAVENOUS
  Filled 2022-12-01 (×2): qty 100

## 2022-12-01 MED ORDER — ACETAMINOPHEN 500 MG PO TABS: 1000.0000 mg | ORAL_TABLET | Freq: Once | ORAL | Status: DC

## 2022-12-01 MED ORDER — METHOCARBAMOL 500 MG IVPB - SIMPLE MED
500.0000 mg | Freq: Four times a day (QID) | INTRAVENOUS | Status: DC | PRN
  Administered 2022-12-01: 500 mg via INTRAVENOUS

## 2022-12-01 MED ORDER — CEFAZOLIN SODIUM-DEXTROSE 2-4 GM/100ML-% IV SOLN
2.0000 g | INTRAVENOUS | Status: AC
  Administered 2022-12-01: 2 g via INTRAVENOUS
  Filled 2022-12-01: qty 100

## 2022-12-01 MED ORDER — EPHEDRINE SULFATE-NACL 50-0.9 MG/10ML-% IV SOSY
PREFILLED_SYRINGE | INTRAVENOUS | Status: DC | PRN
  Administered 2022-12-01: 5 mg via INTRAVENOUS
  Administered 2022-12-01: 10 mg via INTRAVENOUS
  Administered 2022-12-01 (×2): 5 mg via INTRAVENOUS

## 2022-12-01 MED ORDER — POLYETHYLENE GLYCOL 3350 17 G PO PACK: 17.0000 g | PACK | Freq: Every day | ORAL | Status: DC | PRN

## 2022-12-01 MED ORDER — PROPOFOL 10 MG/ML IV BOLUS
INTRAVENOUS | Status: AC
  Filled 2022-12-01: qty 20

## 2022-12-01 MED ORDER — POVIDONE-IODINE 10 % EX SWAB
2.0000 | Freq: Once | CUTANEOUS | Status: AC
  Administered 2022-12-01: 2 via TOPICAL

## 2022-12-01 MED ORDER — BUPIVACAINE IN DEXTROSE 0.75-8.25 % IT SOLN
INTRATHECAL | Status: DC | PRN
  Administered 2022-12-01: 1.4 mL via INTRATHECAL

## 2022-12-01 MED ORDER — ACETAMINOPHEN 10 MG/ML IV SOLN
1000.0000 mg | Freq: Four times a day (QID) | INTRAVENOUS | Status: DC
  Administered 2022-12-01: 1000 mg via INTRAVENOUS
  Filled 2022-12-01: qty 100

## 2022-12-01 MED ORDER — DIPHENHYDRAMINE HCL 12.5 MG/5ML PO ELIX: 12.5000 mg | ORAL_SOLUTION | ORAL | Status: DC | PRN

## 2022-12-01 MED ORDER — BISACODYL 10 MG RE SUPP: 10.0000 mg | Freq: Every day | RECTAL | Status: DC | PRN

## 2022-12-01 MED ORDER — MENTHOL 3 MG MT LOZG: 1.0000 | LOZENGE | OROMUCOSAL | Status: DC | PRN

## 2022-12-01 MED ORDER — ACETAMINOPHEN 500 MG PO TABS
1000.0000 mg | ORAL_TABLET | Freq: Four times a day (QID) | ORAL | Status: AC
  Administered 2022-12-01 – 2022-12-02 (×4): 1000 mg via ORAL
  Filled 2022-12-01 (×4): qty 2

## 2022-12-01 MED ORDER — PROPOFOL 500 MG/50ML IV EMUL
INTRAVENOUS | Status: DC | PRN
  Administered 2022-12-01: 75 ug/kg/min via INTRAVENOUS

## 2022-12-01 MED ORDER — DEXAMETHASONE SODIUM PHOSPHATE 10 MG/ML IJ SOLN: 8.0000 mg | Freq: Once | INTRAMUSCULAR | Status: DC

## 2022-12-01 MED ORDER — BUPIVACAINE LIPOSOME 1.3 % IJ SUSP: 20.0000 mL | Freq: Once | INTRAMUSCULAR | Status: DC

## 2022-12-01 MED ORDER — PROPOFOL 10 MG/ML IV BOLUS
INTRAVENOUS | Status: DC | PRN
  Administered 2022-12-01: 20 mg via INTRAVENOUS

## 2022-12-01 MED ORDER — ONDANSETRON HCL 4 MG/2ML IJ SOLN
INTRAMUSCULAR | Status: DC | PRN
  Administered 2022-12-01: 4 mg via INTRAVENOUS

## 2022-12-01 MED ORDER — CELECOXIB 200 MG PO CAPS
200.0000 mg | ORAL_CAPSULE | Freq: Once | ORAL | Status: AC
  Administered 2022-12-01: 200 mg via ORAL
  Filled 2022-12-01: qty 1

## 2022-12-01 MED ORDER — FENTANYL CITRATE PF 50 MCG/ML IJ SOSY
50.0000 ug | PREFILLED_SYRINGE | INTRAMUSCULAR | Status: DC
  Filled 2022-12-01: qty 2

## 2022-12-01 MED ORDER — SODIUM CHLORIDE (PF) 0.9 % IJ SOLN
INTRAMUSCULAR | Status: AC
  Filled 2022-12-01: qty 10

## 2022-12-01 MED ORDER — TRAMADOL HCL 50 MG PO TABS
50.0000 mg | ORAL_TABLET | Freq: Four times a day (QID) | ORAL | Status: DC | PRN
  Administered 2022-12-01 (×2): 100 mg via ORAL
  Filled 2022-12-01 (×2): qty 2

## 2022-12-01 MED ORDER — SODIUM CHLORIDE (PF) 0.9 % IJ SOLN
INTRAMUSCULAR | Status: AC
  Filled 2022-12-01: qty 50

## 2022-12-01 MED ORDER — PHENOL 1.4 % MT LIQD: 1.0000 | OROMUCOSAL | Status: DC | PRN

## 2022-12-01 MED ORDER — BUPIVACAINE LIPOSOME 1.3 % IJ SUSP
INTRAMUSCULAR | Status: AC
  Filled 2022-12-01: qty 20

## 2022-12-01 MED ORDER — ROPIVACAINE HCL 5 MG/ML IJ SOLN
INTRAMUSCULAR | Status: DC | PRN
  Administered 2022-12-01: 30 mL via PERINEURAL

## 2022-12-01 MED ORDER — SODIUM CHLORIDE 0.9 % IV SOLN: INTRAVENOUS | Status: DC

## 2022-12-01 MED ORDER — GABAPENTIN 100 MG PO CAPS
100.0000 mg | ORAL_CAPSULE | Freq: Three times a day (TID) | ORAL | Status: DC
  Administered 2022-12-01 – 2022-12-02 (×4): 100 mg via ORAL
  Filled 2022-12-01 (×4): qty 1

## 2022-12-01 MED ORDER — DEXAMETHASONE SODIUM PHOSPHATE 10 MG/ML IJ SOLN
INTRAMUSCULAR | Status: DC | PRN
  Administered 2022-12-01: 4 mg via INTRAVENOUS

## 2022-12-01 MED ORDER — MIDAZOLAM HCL 2 MG/2ML IJ SOLN
1.0000 mg | INTRAMUSCULAR | Status: DC
  Filled 2022-12-01: qty 2

## 2022-12-01 MED ORDER — METOCLOPRAMIDE HCL 5 MG PO TABS: 5.0000 mg | ORAL_TABLET | Freq: Three times a day (TID) | ORAL | Status: DC | PRN

## 2022-12-01 MED ORDER — ONDANSETRON HCL 4 MG/2ML IJ SOLN: 4.0000 mg | Freq: Four times a day (QID) | INTRAMUSCULAR | Status: DC | PRN

## 2022-12-01 MED ORDER — MIDAZOLAM HCL 5 MG/5ML IJ SOLN
INTRAMUSCULAR | Status: DC | PRN
  Administered 2022-12-01: 1 mg via INTRAVENOUS

## 2022-12-01 MED ORDER — METHOCARBAMOL 500 MG PO TABS
500.0000 mg | ORAL_TABLET | Freq: Four times a day (QID) | ORAL | Status: DC | PRN
  Administered 2022-12-01: 500 mg via ORAL
  Filled 2022-12-01: qty 1

## 2022-12-01 MED ORDER — HYDROMORPHONE HCL 2 MG PO TABS
2.0000 mg | ORAL_TABLET | ORAL | Status: DC | PRN
  Administered 2022-12-01 – 2022-12-02 (×2): 2 mg via ORAL
  Filled 2022-12-01 (×2): qty 1

## 2022-12-01 MED ORDER — SODIUM CHLORIDE 0.9 % IV SOLN
INTRAVENOUS | Status: DC | PRN
  Administered 2022-12-01: 80 mL

## 2022-12-01 MED ORDER — DOCUSATE SODIUM 100 MG PO CAPS
100.0000 mg | ORAL_CAPSULE | Freq: Two times a day (BID) | ORAL | Status: DC
  Administered 2022-12-01 – 2022-12-02 (×3): 100 mg via ORAL
  Filled 2022-12-01 (×3): qty 1

## 2022-12-01 MED ORDER — METOCLOPRAMIDE HCL 5 MG/ML IJ SOLN: 5.0000 mg | Freq: Three times a day (TID) | INTRAMUSCULAR | Status: DC | PRN

## 2022-12-01 MED ORDER — HYDROMORPHONE HCL 1 MG/ML IJ SOLN
0.5000 mg | INTRAMUSCULAR | Status: DC | PRN
  Administered 2022-12-01: 1 mg via INTRAVENOUS
  Filled 2022-12-01: qty 1

## 2022-12-01 MED ORDER — DEXAMETHASONE SODIUM PHOSPHATE 4 MG/ML IJ SOLN
INTRAMUSCULAR | Status: DC | PRN
  Administered 2022-12-01: 5 mg via PERINEURAL

## 2022-12-01 MED ORDER — LIDOCAINE 2% (20 MG/ML) 5 ML SYRINGE
INTRAMUSCULAR | Status: DC | PRN
  Administered 2022-12-01: 50 mg via INTRAVENOUS

## 2022-12-01 MED ORDER — CHLORHEXIDINE GLUCONATE 0.12 % MT SOLN
15.0000 mL | Freq: Once | OROMUCOSAL | Status: AC
  Administered 2022-12-01: 15 mL via OROMUCOSAL

## 2022-12-01 MED ORDER — ONDANSETRON HCL 4 MG PO TABS: 4.0000 mg | ORAL_TABLET | Freq: Four times a day (QID) | ORAL | Status: DC | PRN

## 2022-12-01 SURGICAL SUPPLY — 57 items
ATTUNE MED DOME PAT 38 KNEE (Knees) IMPLANT
ATTUNE PS FEM LT SZ 5 CEM KNEE (Femur) IMPLANT
ATTUNE PSRP INSR SZ 5 10M KNEE (Insert) IMPLANT
BAG COUNTER SPONGE SURGICOUNT (BAG) IMPLANT
BAG SPEC THK2 15X12 ZIP CLS (MISCELLANEOUS) ×1
BAG SPNG CNTER NS LX DISP (BAG)
BAG ZIPLOCK 12X15 (MISCELLANEOUS) ×2 IMPLANT
BASE TIBIAL ROT PLAT SZ 5 KNEE (Knees) IMPLANT
BLADE SAG 18X100X1.27 (BLADE) ×2 IMPLANT
BLADE SAW SGTL 11.0X1.19X90.0M (BLADE) ×2 IMPLANT
BNDG ELASTIC 6X5.8 VLCR STR LF (GAUZE/BANDAGES/DRESSINGS) ×2 IMPLANT
BOWL SMART MIX CTS (DISPOSABLE) ×2 IMPLANT
BSPLAT TIB 5 CMNT ROT PLAT STR (Knees) ×1 IMPLANT
CEMENT HV SMART SET (Cement) ×4 IMPLANT
COVER SURGICAL LIGHT HANDLE (MISCELLANEOUS) ×2 IMPLANT
CUFF TOURN SGL QUICK 34 (TOURNIQUET CUFF) ×1
CUFF TRNQT CYL 34X4.125X (TOURNIQUET CUFF) ×2 IMPLANT
DRAPE INCISE IOBAN 66X45 STRL (DRAPES) ×2 IMPLANT
DRAPE U-SHAPE 47X51 STRL (DRAPES) ×2 IMPLANT
DRSG AQUACEL AG ADV 3.5X10 (GAUZE/BANDAGES/DRESSINGS) ×2 IMPLANT
DURAPREP 26ML APPLICATOR (WOUND CARE) ×2 IMPLANT
ELECT REM PT RETURN 15FT ADLT (MISCELLANEOUS) ×2 IMPLANT
GLOVE BIO SURGEON STRL SZ 6.5 (GLOVE) IMPLANT
GLOVE BIO SURGEON STRL SZ7.5 (GLOVE) IMPLANT
GLOVE BIO SURGEON STRL SZ8 (GLOVE) ×2 IMPLANT
GLOVE BIOGEL PI IND STRL 6.5 (GLOVE) IMPLANT
GLOVE BIOGEL PI IND STRL 7.0 (GLOVE) IMPLANT
GLOVE BIOGEL PI IND STRL 8 (GLOVE) ×2 IMPLANT
GOWN STRL REUS W/ TWL LRG LVL3 (GOWN DISPOSABLE) ×2 IMPLANT
GOWN STRL REUS W/ TWL XL LVL3 (GOWN DISPOSABLE) IMPLANT
GOWN STRL REUS W/TWL LRG LVL3 (GOWN DISPOSABLE) ×1
GOWN STRL REUS W/TWL XL LVL3 (GOWN DISPOSABLE)
HANDPIECE INTERPULSE COAX TIP (DISPOSABLE) ×1
HOLDER FOLEY CATH W/STRAP (MISCELLANEOUS) IMPLANT
IMMOBILIZER KNEE 20 (SOFTGOODS) ×1 IMPLANT
IMMOBILIZER KNEE 20 THIGH 36 (SOFTGOODS) ×2 IMPLANT
KIT TURNOVER KIT A (KITS) IMPLANT
MANIFOLD NEPTUNE II (INSTRUMENTS) ×2 IMPLANT
NS IRRIG 1000ML POUR BTL (IV SOLUTION) ×2 IMPLANT
PACK TOTAL KNEE CUSTOM (KITS) ×2 IMPLANT
PADDING CAST COTTON 6X4 STRL (CAST SUPPLIES) ×4 IMPLANT
PADDING CAST SYNTHETIC 6X4 NS (CAST SUPPLIES) IMPLANT
PIN STEINMAN FIXATION KNEE (PIN) IMPLANT
PROTECTOR NERVE ULNAR (MISCELLANEOUS) ×2 IMPLANT
SET HNDPC FAN SPRY TIP SCT (DISPOSABLE) ×2 IMPLANT
SPIKE FLUID TRANSFER (MISCELLANEOUS) ×2 IMPLANT
STRIP CLOSURE SKIN 1/2X4 (GAUZE/BANDAGES/DRESSINGS) ×4 IMPLANT
SUT MNCRL AB 4-0 PS2 18 (SUTURE) ×2 IMPLANT
SUT STRATAFIX 0 PDS 27 VIOLET (SUTURE) ×1
SUT VIC AB 2-0 CT1 27 (SUTURE) ×3
SUT VIC AB 2-0 CT1 TAPERPNT 27 (SUTURE) ×6 IMPLANT
SUTURE STRATFX 0 PDS 27 VIOLET (SUTURE) ×2 IMPLANT
TIBIAL BASE ROT PLAT SZ 5 KNEE (Knees) ×1 IMPLANT
TRAY FOLEY MTR SLVR 16FR STAT (SET/KITS/TRAYS/PACK) ×2 IMPLANT
TUBE SUCTION HIGH CAP CLEAR NV (SUCTIONS) ×2 IMPLANT
WATER STERILE IRR 1000ML POUR (IV SOLUTION) ×4 IMPLANT
WRAP KNEE MAXI GEL POST OP (GAUZE/BANDAGES/DRESSINGS) ×2 IMPLANT

## 2022-12-01 NOTE — Anesthesia Postprocedure Evaluation (Signed)
Anesthesia Post Note  Patient: Lindsay Villarreal  Procedure(s) Performed: TOTAL KNEE ARTHROPLASTY (Left: Knee)     Patient location during evaluation: PACU Anesthesia Type: Spinal Level of consciousness: sedated and patient cooperative Pain management: pain level controlled Vital Signs Assessment: post-procedure vital signs reviewed and stable Respiratory status: spontaneous breathing Cardiovascular status: stable Anesthetic complications: no   No notable events documented.  Last Vitals:  Vitals:   12/01/22 1330 12/01/22 1427  BP: (!) 117/58 97/74  Pulse: 64 70  Resp: 17 16  Temp: (!) 36.4 C 36.5 C  SpO2: 97% 100%    Last Pain:  Vitals:   12/01/22 1757  TempSrc:   PainSc: [place]

## 2022-12-01 NOTE — Transfer of Care (Signed)
Immediate Anesthesia Transfer of Care Note  Patient: Lindsay Villarreal  Procedure(s) Performed: Procedure(s): TOTAL KNEE ARTHROPLASTY (Left)  Patient Location: PACU  Anesthesia Type:Spinal  Level of Consciousness:  sedated, patient cooperative and responds to stimulation  Airway & Oxygen Therapy:Patient Spontanous Breathing and Patient connected to face mask oxgen  Post-op Assessment:  Report given to PACU RN and Post -op Vital signs reviewed and stable  Post vital signs:  Reviewed and stable  Last Vitals:  Vitals:   12/01/22 0605 12/01/22 0943  BP: (!) 138/59 121/60  Pulse: 66 65  Resp: 16 13  Temp: 36.6 C 36.4 C  SpO2: 16% 109%    Complications: No apparent anesthesia complications

## 2022-12-01 NOTE — Anesthesia Procedure Notes (Signed)
Anesthesia Regional Block: Adductor canal block   Pre-Anesthetic Checklist: , timeout performed,  Correct Patient, Correct Site, Correct Laterality,  Correct Procedure, Correct Position, site marked,  Risks and benefits discussed,  Surgical consent,  Pre-op evaluation,  At surgeon's request and post-op pain management  Laterality: Lower and Left  Prep: chloraprep       Needles:  Injection technique: Single-shot  Needle Type: Stimiplex     Needle Length: 9cm  Needle Gauge: 21     Additional Needles:   Procedures:,,,, ultrasound used (permanent image in chart),,    Narrative:  Start time: 12/01/2022 7:30 AM End time: 12/01/2022 7:50 AM Injection made incrementally with aspirations every 5 mL.  Performed by: Personally  Anesthesiologist: Nolon Nations, MD  Additional Notes: BP cuff, EKG monitors applied. Sedation begun. Artery and nerve location verified with ultrasound. Anesthetic injected incrementally (18m), slowly, and after negative aspirations under direct u/s guidance. Good fascial/perineural spread. Tolerated well.

## 2022-12-01 NOTE — Op Note (Signed)
OPERATIVE REPORT-TOTAL KNEE ARTHROPLASTY   Pre-operative diagnosis- Osteoarthritis  Left knee(s)  Post-operative diagnosis- Osteoarthritis Left knee(s)  Procedure-  Left  Total Knee Arthroplasty  Surgeon- Dione Plover. Avaley Coop, MD  Assistant- Shearon Balo, PA-C   Anesthesia-   Adductor canal block and spinal  EBL-50 mL   Drains None  Tourniquet time- 32 minutes @ 161 mm Hg  Complications- None  Condition-PACU - hemodynamically stable.   Brief Clinical Note  Lindsay Villarreal is a 77 y.o. year old female with end stage OA of her left knee with progressively worsening pain and dysfunction. She has constant pain, with activity and at rest and significant functional deficits with difficulties even with ADLs. She has had extensive non-op management including analgesics, injections of cortisone and viscosupplements, and home exercise program, but remains in significant pain with significant dysfunction. Radiographs show bone on bone arthritis medial and patellofemoral. She presents now for left Total Knee Arthroplasty.     Procedure in detail---   The patient is brought into the operating room and positioned supine on the operating table. After successful administration of  Adductor canal block and spinal,   a tourniquet is placed high on the  Left thigh(s) and the lower extremity is prepped and draped in the usual sterile fashion. Time out is performed by the operating team and then the  Left lower extremity is wrapped in Esmarch, knee flexed and the tourniquet inflated to 300 mmHg.       A midline incision is made with a ten blade through the subcutaneous tissue to the level of the extensor mechanism. A fresh blade is used to make a medial parapatellar arthrotomy. Soft tissue over the proximal medial tibia is subperiosteally elevated to the joint line with a knife and into the semimembranosus bursa with a Cobb elevator. Soft tissue over the proximal lateral tibia is elevated with attention  being paid to avoiding the patellar tendon on the tibial tubercle. The patella is everted, knee flexed 90 degrees and the ACL and PCL are removed. Findings are bone on bone medial and patellofemoral with large global osteophytes        The drill is used to create a starting hole in the distal femur and the canal is thoroughly irrigated with sterile saline to remove the fatty contents. The 5 degree Left  valgus alignment guide is placed into the femoral canal and the distal femoral cutting block is pinned to remove 9 mm off the distal femur. Resection is made with an oscillating saw.      The tibia is subluxed forward and the menisci are removed. The extramedullary alignment guide is placed referencing proximally at the medial aspect of the tibial tubercle and distally along the second metatarsal axis and tibial crest. The block is pinned to remove 88m off the more deficient medial  side. Resection is made with an oscillating saw. Size 5 is the most appropriate size for the tibia and the proximal tibia is prepared with the modular drill and keel punch for that size.      The femoral sizing guide is placed and size 5 is most appropriate. Rotation is marked off the epicondylar axis and confirmed by creating a rectangular flexion gap at 90 degrees. The size 5 cutting block is pinned in this rotation and the anterior, posterior and chamfer cuts are made with the oscillating saw. The intercondylar block is then placed and that cut is made.      Trial size 5 tibial component, trial  size 5 posterior stabilized femur and a 10  mm posterior stabilized rotating platform insert trial is placed. Full extension is achieved with excellent varus/valgus and anterior/posterior balance throughout full range of motion. The patella is everted and thickness measured to be 25  mm. Free hand resection is taken to 15 mm, a 38 template is placed, lug holes are drilled, trial patella is placed, and it tracks normally. Osteophytes are  removed off the posterior femur with the trial in place. All trials are removed and the cut bone surfaces prepared with pulsatile lavage. Cement is mixed and once ready for implantation, the size 5 tibial implant, size  5 posterior stabilized femoral component, and the size 38 patella are cemented in place and the patella is held with the clamp. The trial insert is placed and the knee held in full extension. The Exparel (20 ml mixed with 60 ml saline) is injected into the extensor mechanism, posterior capsule, medial and lateral gutters and subcutaneous tissues.  All extruded cement is removed and once the cement is hard the permanent 10 mm posterior stabilized rotating platform insert is placed into the tibial tray.      The wound is copiously irrigated with saline solution and the extensor mechanism closed with # 0 Stratofix suture. The tourniquet is released for a total tourniquet time of 32  minutes. Flexion against gravity is 140 degrees and the patella tracks normally. Subcutaneous tissue is closed with 2.0 vicryl and subcuticular with running 4.0 Monocryl. The incision is cleaned and dried and steri-strips and a bulky sterile dressing are applied. The limb is placed into a knee immobilizer and the patient is awakened and transported to recovery in stable condition.      Please note that a surgical assistant was a medical necessity for this procedure in order to perform it in a safe and expeditious manner. Surgical assistant was necessary to retract the ligaments and vital neurovascular structures to prevent injury to them and also necessary for proper positioning of the limb to allow for anatomic placement of the prosthesis.   Dione Plover Francesca Strome, MD    12/01/2022, 9:12 AM

## 2022-12-01 NOTE — Interval H&P Note (Signed)
History and Physical Interval Note:  12/01/2022 6:30 AM  Lindsay Villarreal  has presented today for surgery, with the diagnosis of left knee osteoarthritis.  The various methods of treatment have been discussed with the patient and family. After consideration of risks, benefits and other options for treatment, the patient has consented to  Procedure(s): TOTAL KNEE ARTHROPLASTY (Left) as a surgical intervention.  The patient's history has been reviewed, patient examined, no change in status, stable for surgery.  I have reviewed the patient's chart and labs.  Questions were answered to the patient's satisfaction.     Pilar Plate Joaquim Tolen

## 2022-12-01 NOTE — Discharge Instructions (Signed)
Lindsay Arabian, MD Total Joint Specialist [facility] [place] [address], [address] [place], [place] [address] [phone]  TOTAL KNEE REPLACEMENT POSTOPERATIVE DIRECTIONS    Knee Rehabilitation, Guidelines Following Surgery  Results after knee surgery are often greatly improved when you follow the exercise, range of motion and muscle strengthening exercises prescribed by your doctor. Safety measures are also important to protect the knee from further injury. If any of these exercises cause you to have increased pain or swelling in your knee joint, decrease the amount until you are comfortable again and slowly increase them. If you have problems or questions, call your caregiver or physical therapist for advice.   BLOOD CLOT PREVENTION Take a 325 mg Aspirin two times a day for three weeks following surgery. Then resume your normal '81mg'$  aspirin daily. You may resume your vitamins/supplements upon discharge from the hospital. Do not take any NSAIDs (Advil, Aleve, Ibuprofen, Meloxicam, etc.) until you have discontinued the 325 mg Aspirin.   HOME CARE INSTRUCTIONS  Remove items at home which could result in a fall. This includes throw rugs or furniture in walking pathways.  ICE to the affected knee as much as tolerated. Icing helps control swelling. If the swelling is well controlled you will be more comfortable and rehab easier. Continue to use ice on the knee for pain and swelling from surgery. You may notice swelling that will progress down to the foot and ankle. This is normal after surgery. Elevate the leg when you are not up walking on it.    Continue to use the breathing machine which will help keep your temperature down. It is common for your temperature to cycle up and down following surgery, especially at night when you are not up moving around and exerting yourself. The breathing machine keeps your lungs expanded and your temperature down. Do not place pillow under the  operative knee, focus on keeping the knee straight while resting  DIET You may resume your previous home diet once you are discharged from the hospital.  DRESSING / WOUND CARE / SHOWERING Keep your bulky bandage on for 2 days. On the third post-operative day you may remove the Ace bandage and gauze. There is a waterproof adhesive bandage on your skin which will stay in place until your first follow-up appointment. Once you remove this you will not need to place another bandage You may begin showering 3 days following surgery, but do not submerge the incision under water.  ACTIVITY For the first 5 days, the key is rest and control of pain and swelling Do your home exercises twice a day starting on post-operative day 3. On the days you go to physical therapy, just do the home exercises once that day. You should rest, ice and elevate the leg for 50 minutes out of every hour. Get up and walk/stretch for 10 minutes per hour. After 5 days you can increase your activity slowly as tolerated. Walk with your walker as instructed. Use the walker until you are comfortable transitioning to a cane. Walk with the cane in the opposite hand of the operative leg. You may discontinue the cane once you are comfortable and walking steadily. Avoid periods of inactivity such as sitting longer than an hour when not asleep. This helps prevent blood clots.  You may discontinue the knee immobilizer once you are able to perform a straight leg raise while lying down. You may resume a sexual relationship in one month or when given the OK by your  doctor.  You may return to work once you are cleared by your doctor.  Do not drive a car for 6 weeks or until released by your surgeon.  Do not drive while taking narcotics.  TED HOSE STOCKINGS Wear the elastic stockings on both legs for three weeks following surgery during the day. You may remove them at night for sleeping.  WEIGHT BEARING Weight bearing as tolerated with assist  device (walker, cane, etc) as directed, use it as long as suggested by your surgeon or therapist, typically at least 4-6 weeks.  POSTOPERATIVE CONSTIPATION PROTOCOL Constipation - defined medically as fewer than three stools per week and severe constipation as less than one stool per week.  One of the most common issues patients have following surgery is constipation.  Even if you have a regular bowel pattern at home, your normal regimen is likely to be disrupted due to multiple reasons following surgery.  Combination of anesthesia, postoperative narcotics, change in appetite and fluid intake all can affect your bowels.  In order to avoid complications following surgery, here are some recommendations in order to help you during your recovery period.  Colace (docusate) - Pick up an over-the-counter form of Colace or another stool softener and take twice a day as long as you are requiring postoperative pain medications.  Take with a full glass of water daily.  If you experience loose stools or diarrhea, hold the colace until you stool forms back up. If your symptoms do not get better within 1 week or if they get worse, check with your doctor. Dulcolax (bisacodyl) - Pick up over-the-counter and take as directed by the product packaging as needed to assist with the movement of your bowels.  Take with a full glass of water.  Use this product as needed if not relieved by Colace only.  MiraLax (polyethylene glycol) - Pick up over-the-counter to have on hand. MiraLax is a solution that will increase the amount of water in your bowels to assist with bowel movements.  Take as directed and can mix with a glass of water, juice, soda, coffee, or tea. Take if you go more than two days without a movement. Do not use MiraLax more than once per day. Call your doctor if you are still constipated or irregular after using this medication for 7 days in a row.  If you continue to have problems with postoperative constipation,  please contact the office for further assistance and recommendations.  If you experience "the worst abdominal pain ever" or develop nausea or vomiting, please contact the office immediatly for further recommendations for treatment.  ITCHING If you experience itching with your medications, try taking only a single pain pill, or even half a pain pill at a time.  You can also use Benadryl over the counter for itching or also to help with sleep.   MEDICATIONS See your medication summary on the "After Visit Summary" that the nursing staff will review with you prior to discharge.  You may have some home medications which will be placed on hold until you complete the course of blood thinner medication.  It is important for you to complete the blood thinner medication as prescribed by your surgeon.  Continue your approved medications as instructed at time of discharge.  PRECAUTIONS If you experience chest pain or shortness of breath - call 911 immediately for transfer to the hospital emergency department.  If you develop a fever greater that 101 F, purulent drainage from wound, increased redness or  drainage from wound, foul odor from the wound/dressing, or calf pain - CONTACT YOUR SURGEON.                                                   FOLLOW-UP APPOINTMENTS Make sure you keep all of your appointments after your operation with your surgeon and caregivers. You should call the office at the above phone number and make an appointment for approximately two weeks after the date of your surgery or on the date instructed by your surgeon outlined in the "After Visit Summary".  RANGE OF MOTION AND STRENGTHENING EXERCISES  Rehabilitation of the knee is important following a knee injury or an operation. After just a few days of immobilization, the muscles of the thigh which control the knee become weakened and shrink (atrophy). Knee exercises are designed to build up the tone and strength of the thigh muscles and to  improve knee motion. Often times heat used for twenty to thirty minutes before working out will loosen up your tissues and help with improving the range of motion but do not use heat for the first two weeks following surgery. These exercises can be done on a training (exercise) mat, on the floor, on a table or on a bed. Use what ever works the best and is most comfortable for you Knee exercises include:  Leg Lifts - While your knee is still immobilized in a splint or cast, you can do straight leg raises. Lift the leg to 60 degrees, hold for 3 sec, and slowly lower the leg. Repeat 10-20 times 2-3 times daily. Perform this exercise against resistance later as your knee gets better.  Quad and Hamstring Sets - Tighten up the muscle on the front of the thigh (Quad) and hold for 5-10 sec. Repeat this 10-20 times hourly. Hamstring sets are done by pushing the foot backward against an object and holding for 5-10 sec. Repeat as with quad sets.  Leg Slides: Lying on your back, slowly slide your foot toward your buttocks, bending your knee up off the floor (only go as far as is comfortable). Then slowly slide your foot back down until your leg is flat on the floor again. Angel Wings: Lying on your back spread your legs to the side as far apart as you can without causing discomfort.  A rehabilitation program following serious knee injuries can speed recovery and prevent re-injury in the future due to weakened muscles. Contact your doctor or a physical therapist for more information on knee rehabilitation.   POST-OPERATIVE OPIOID TAPER INSTRUCTIONS: It is important to wean off of your opioid medication as soon as possible. If you do not need pain medication after your surgery it is ok to stop day one. Opioids include: Codeine, Hydrocodone(Norco, Vicodin), Oxycodone(Percocet, oxycontin) and hydromorphone amongst others.  Long term and even short term use of opiods can cause: Increased pain  response Dependence Constipation Depression Respiratory depression And more.  Withdrawal symptoms can include Flu like symptoms Nausea, vomiting And more Techniques to manage these symptoms Hydrate well Eat regular healthy meals Stay active Use relaxation techniques(deep breathing, meditating, yoga) Do Not substitute Alcohol to help with tapering If you have been on opioids for less than two weeks and do not have pain than it is ok to stop all together.  Plan to wean off of opioids This plan  should start within one week post op of your joint replacement. Maintain the same interval or time between taking each dose and first decrease the dose.  Cut the total daily intake of opioids by one tablet each day Next start to increase the time between doses. The last dose that should be eliminated is the evening dose.   IF YOU ARE TRANSFERRED TO A SKILLED REHAB FACILITY If the patient is transferred to a skilled rehab facility following release from the hospital, a list of the current medications will be sent to the facility for the patient to continue.  When discharged from the skilled rehab facility, please have the facility set up the patient's [place] prior to being released. Also, the skilled facility will be responsible for providing the patient with their medications at time of release from the facility to include their pain medication, the muscle relaxants, and their blood thinner medication. If the patient is still at the rehab facility at time of the two week follow up appointment, the skilled rehab facility will also need to assist the patient in arranging follow up appointment in our office and any transportation needs.  MAKE SURE YOU:  Understand these instructions.  Get help right away if you are not doing well or get worse.   DENTAL ANTIBIOTICS:  In most cases prophylactic antibiotics for Dental procdeures after total joint surgery are not  necessary.  Exceptions are as follows:  1. History of prior total joint infection  2. Severely immunocompromised (Organ Transplant, cancer chemotherapy, Rheumatoid biologic meds such as [place])  3. Poorly controlled diabetes (A1C &gt; 8.0, blood glucose over 200)  If you have one of these conditions, contact your surgeon for an antibiotic prescription, prior to your dental procedure.    Pick up stool softner and laxative for home use following surgery while on pain medications. Do not submerge incision under water. Please use good hand washing techniques while changing dressing each day. May shower starting three days after surgery. Please use a clean towel to pat the incision dry following showers. Continue to use ice for pain and swelling after surgery. Do not use any lotions or creams on the incision until instructed by your surgeon.

## 2022-12-01 NOTE — Evaluation (Signed)
Physical Therapy Evaluation Patient Details Name: Lindsay Villarreal MRN: [id number] DOB: [birth year] Today's Date: 12/01/2022  History of Present Illness  77 yo female, S/p LTKA  12/01/22. PMH:HTN, DM,heart murmur  Clinical Impression  Pt admitted with above diagnosis.  Pt currently with functional limitations due to the deficits listed below (see PT Problem List). Pt will benefit from skilled PT to increase their independence and safety with mobility to allow discharge to the venue listed below.     The patient ambulated x 20', reported feeling nausea which resolved. BP  131/75 after ambulation. Patient should progress to DC home with family support.     Recommendations for follow up therapy are one component of a multi-disciplinary discharge planning process, led by the attending physician.  Recommendations may be updated based on patient status, additional functional criteria and insurance authorization.  Follow Up Recommendations Follow physician's recommendations for discharge plan and follow up therapies      Assistance Recommended at Discharge Intermittent Supervision/Assistance  Patient can return home with the following  A little help with walking and/or transfers;Help with stairs or ramp for entrance;A little help with bathing/dressing/bathroom;Assist for transportation    Equipment Recommendations Rolling walker (2 wheels)  Recommendations for Other Services       Functional Status Assessment Patient has had a recent decline in their functional status and demonstrates the ability to make significant improvements in function in a reasonable and predictable amount of time.     Precautions / Restrictions Precautions Precautions: Knee;Fall Required Braces or Orthoses: Knee Immobilizer - Right Knee Immobilizer - Right: Discontinue once straight leg raise with < 10 degree lag      Mobility  Bed Mobility Overal bed mobility: Needs Assistance Bed Mobility: Supine to Sit      Supine to sit: Min assist     General bed mobility comments: support LLE    Transfers Overall transfer level: Needs assistance Equipment used: Rolling walker (2 wheels) Transfers: Sit to/from Stand Sit to Stand: Min assist           General transfer comment: cues for hand and LLE position    Ambulation/Gait Ambulation/Gait assistance: Min assist Gait Distance (Feet): 20 Feet Assistive device: Rolling walker (2 wheels) Gait Pattern/deviations: Step-to pattern, Step-through pattern, Antalgic Gait velocity: decr     General Gait Details: cues for sequence  Stairs            Wheelchair Mobility    Modified Rankin (Stroke Patients Only)       Balance Overall balance assessment: Needs assistance Sitting-balance support: Bilateral upper extremity supported Sitting balance-Leahy Scale: Good     Standing balance support: Bilateral upper extremity supported, During functional activity, Reliant on assistive device for balance Standing balance-Leahy Scale: Fair                               Pertinent Vitals/Pain Pain Assessment Pain Assessment: 0-10 Pain Score: 3  Pain Location: left knee Pain Descriptors / Indicators: Discomfort, Grimacing Pain Intervention(s): Monitored during session, Premedicated before session, Limited activity within patient's tolerance    Home Living Family/patient expects to be discharged to:: Private residence Living Arrangements: Children;Other relatives Available Help at Discharge: Family;Available 24 hours/day Type of Home: House Home Access: Stairs to enter Entrance Stairs-Rails: None Entrance Stairs-Number of Steps: 3   Home Layout: One level Home Equipment: None      Prior Function Prior Level of Function : Independent/Modified Independent  Hand Dominance        Extremity/Trunk Assessment   Upper Extremity Assessment Upper Extremity Assessment: Overall WFL for tasks  assessed    Lower Extremity Assessment Lower Extremity Assessment: LLE deficits/detail LLE Deficits / Details: SLR, Knee flexion  10-45    Cervical / Trunk Assessment Cervical / Trunk Assessment: Normal  Communication   Communication: No difficulties  Cognition Arousal/Alertness: Awake/alert Behavior During Therapy: WFL for tasks assessed/performed Overall Cognitive Status: Within Functional Limits for tasks assessed                                          General Comments      Exercises     Assessment/Plan    PT Assessment Patient needs continued PT services  PT Problem List Decreased strength;Decreased mobility;Decreased range of motion;Decreased safety awareness;Decreased activity tolerance;Pain       PT Treatment Interventions DME instruction;Therapeutic activities;Gait training;Therapeutic exercise;Patient/family education;Functional mobility training;Stair training    PT Goals (Current goals can be found in the Care Plan section)  Acute Rehab PT Goals Patient Stated Goal: go home PT Goal Formulation: With patient/family Time For Goal Achievement: 12/08/22 Potential to Achieve Goals: Good    Frequency 7X/week     Co-evaluation               AM-PAC PT "6 Clicks" Mobility  Outcome Measure Help needed turning from your back to your side while in a flat bed without using bedrails?: A Little Help needed moving from lying on your back to sitting on the side of a flat bed without using bedrails?: A Little Help needed moving to and from a bed to a chair (including a wheelchair)?: A Little Help needed standing up from a chair using your arms (e.g., wheelchair or bedside chair)?: A Little Help needed to walk in hospital room?: A Little Help needed climbing 3-5 steps with a railing? : A Lot 6 Click Score: 17    End of Session Equipment Utilized During Treatment: Gait belt;Left knee immobilizer Activity Tolerance: Patient tolerated treatment  well Patient left: in chair;with call bell/phone within reach;with family/visitor present Nurse Communication: Mobility status PT Visit Diagnosis: Unsteadiness on feet (R26.81);Difficulty in walking, not elsewhere classified (R26.2)    Time: 1537-1600 PT Time Calculation (min) (ACUTE ONLY): 23 min   Charges:   PT Evaluation $PT Eval Low Complexity: 1 Low PT Treatments $Gait Training: 8-22 mins        [place] Office [phone] Weekend [phone]   Claretha Cooper 12/01/2022, 5:07 PM

## 2022-12-02 ENCOUNTER — Encounter (HOSPITAL_COMMUNITY): Payer: Self-pay | Admitting: Orthopedic Surgery

## 2022-12-02 DIAGNOSIS — E1140 Type 2 diabetes mellitus with diabetic neuropathy, unspecified: Secondary | ICD-10-CM | POA: Diagnosis not present

## 2022-12-02 DIAGNOSIS — Z79899 Other long term (current) drug therapy: Secondary | ICD-10-CM | POA: Diagnosis not present

## 2022-12-02 DIAGNOSIS — I10 Essential (primary) hypertension: Secondary | ICD-10-CM | POA: Diagnosis not present

## 2022-12-02 DIAGNOSIS — Z96652 Presence of left artificial knee joint: Secondary | ICD-10-CM | POA: Diagnosis not present

## 2022-12-02 DIAGNOSIS — M1712 Unilateral primary osteoarthritis, left knee: Secondary | ICD-10-CM | POA: Diagnosis not present

## 2022-12-02 DIAGNOSIS — Z7982 Long term (current) use of aspirin: Secondary | ICD-10-CM | POA: Diagnosis not present

## 2022-12-02 DIAGNOSIS — R69 Illness, unspecified: Secondary | ICD-10-CM | POA: Diagnosis not present

## 2022-12-02 DIAGNOSIS — Z7984 Long term (current) use of oral hypoglycemic drugs: Secondary | ICD-10-CM | POA: Diagnosis not present

## 2022-12-02 LAB — CBC
HCT: 28.8 % — ABNORMAL LOW (ref 36.0–46.0)
Hemoglobin: 9.5 g/dL — ABNORMAL LOW (ref 12.0–15.0)
MCH: 30.3 pg (ref 26.0–34.0)
MCHC: 33 g/dL (ref 30.0–36.0)
MCV: 91.7 fL (ref 80.0–100.0)
Platelets: 224 10*3/uL (ref 150–400)
RBC: 3.14 MIL/uL — ABNORMAL LOW (ref 3.87–5.11)
RDW: 12.7 % (ref 11.5–15.5)
WBC: 13.7 10*3/uL — ABNORMAL HIGH (ref 4.0–10.5)
nRBC: 0 % (ref 0.0–0.2)

## 2022-12-02 LAB — BASIC METABOLIC PANEL
Anion gap: 6 (ref 5–15)
BUN: 17 mg/dL (ref 8–23)
CO2: 23 mmol/L (ref 22–32)
Calcium: 8.4 mg/dL — ABNORMAL LOW (ref 8.9–10.3)
Chloride: 105 mmol/L (ref 98–111)
Creatinine, Ser: 0.68 mg/dL (ref 0.44–1.00)
GFR, Estimated: >60 mL/min (ref >60)
Glucose, Bld: 162 mg/dL — ABNORMAL HIGH (ref 70–99)
Potassium: 4.5 mmol/L (ref 3.5–5.1)
Sodium: 134 mmol/L — ABNORMAL LOW (ref 135–145)

## 2022-12-02 MED ORDER — METHOCARBAMOL 500 MG PO TABS: 500.0000 mg | ORAL_TABLET | Freq: Four times a day (QID) | ORAL | 0 refills | Status: DC | PRN

## 2022-12-02 MED ORDER — TRAMADOL HCL 50 MG PO TABS: 50.0000 mg | ORAL_TABLET | Freq: Four times a day (QID) | ORAL | 0 refills | Status: DC | PRN

## 2022-12-02 MED ORDER — HYDROMORPHONE HCL 2 MG PO TABS: 2.0000 mg | ORAL_TABLET | Freq: Three times a day (TID) | ORAL | 0 refills | Status: DC | PRN

## 2022-12-02 MED ORDER — ASPIRIN 325 MG PO TBEC: 325.0000 mg | DELAYED_RELEASE_TABLET | Freq: Two times a day (BID) | ORAL | 0 refills | Status: AC

## 2022-12-02 NOTE — Care Plan (Signed)
Ortho Bundle Case Management Note  Patient Details  Name: JESSIKA ROTHERY MRN: [id number] Date of Birth: 1946-08-03  L TKA on 12-01-22 DCP:  Home with dtr DME:  RW ordered through [facility] PT:  [facility] on 12-04-22                   DME Arranged:  Gilford Rile rolling DME Agency:  [employer]  HH Arranged:  NA [place] Agency:  NA  Additional Comments: Please contact me with any questions of if this plan should need to change.  Marianne Sofia, RN,CCM [facility]  [phone] 12/02/2022, 7:22 AM

## 2022-12-02 NOTE — TOC Transition Note (Signed)
Transition of Care [facility]) - CM/SW Discharge Note  Patient Details  Name: Lindsay Villarreal MRN: [id number] Date of Birth: 02/07/1946  [facility] CM/SW Contact:  Sherie Don, LCSW Phone Number: 12/02/2022, 12:41 PM  Clinical Narrative: Patient is expected to discharge home after working with PT. CSW met with patient to confirm discharge plan and needs. Patient will go home with OPPT at [facility]. Patient will need a rolling walker, which was delivered to patient's room by [employer]. TOC signing off.    Final next level of care: OP Rehab Barriers to Discharge: No Barriers Identified  Patient Goals and CMS Choice CMS Medicare.gov Compare Post Acute Care list provided to:: Patient Choice offered to / list presented to : Patient  Discharge Plan and Services Additional resources added to the After Visit Summary for         DME Arranged: Walker rolling DME Agency: [employer] Representative spoke with at DME Agency: Prearranged in orthopedist's office HH Arranged: NA [place] Agency: NA  Social Determinants of Health ([place]) Interventions SDOH Screenings   Food Insecurity: No Food Insecurity (12/01/2022)  Housing: Low Risk  (12/01/2022)  Transportation Needs: No Transportation Needs (12/01/2022)  Utilities: Not At Risk (12/01/2022)  Alcohol Screen: Low Risk  (09/03/2021)  Depression (PHQ2-9): Low Risk  (10/16/2022)  Financial Resource Strain: Low Risk  (09/03/2021)  Physical Activity: Inactive (09/03/2021)  Social Connections: Socially Isolated (09/03/2021)  Stress: No Stress Concern Present (09/03/2021)  Tobacco Use: High Risk (12/02/2022)   Readmission Risk Interventions     No data to display

## 2022-12-02 NOTE — Progress Notes (Signed)
Physical Therapy Treatment Patient Details Name: Lindsay Villarreal MRN: [id number] DOB: 1946/03/14 Today's Date: 12/02/2022   History of Present Illness 77 yo female, S/p LTKA  12/01/22. PMH:HTN, DM,heart murmur    PT Comments    Patient has met goals for DC home with family support. Patient did require cues to not step awauy from [place], cues for sit to stand, leg position.   Recommendations for follow up therapy are one component of a multi-disciplinary discharge planning process, led by the attending physician.  Recommendations may be updated based on patient status, additional functional criteria and insurance authorization.  Follow Up Recommendations  Follow physician's recommendations for discharge plan and follow up therapies     Assistance Recommended at Discharge Intermittent Supervision/Assistance  Patient can return home with the following A little help with walking and/or transfers;Help with stairs or ramp for entrance;A little help with bathing/dressing/bathroom;Assist for transportation   Equipment Recommendations  Rolling walker (2 wheels)    Recommendations for Other Services       Precautions / Restrictions Precautions Precautions: Knee;Fall Precaution Comments: nio KI used     Mobility  Bed Mobility               General bed mobility comments: in Museum/gallery curator used: Rolling walker (2 wheels) Transfers: Sit to/from [employer] to Stand: Supervision           General transfer comment: cues for hand and LLE position    Ambulation/Gait Ambulation/Gait assistance: Counsellor (Feet): 60 Feet Assistive device: Rolling walker (2 wheels) Gait Pattern/deviations: Step-to pattern, Step-through pattern, Antalgic       General Gait Details: cues for sequence   Stairs Stairs: Yes Stairs assistance: Min assist Stair Management: One rail Right, With cane Number of Stairs: 2 General stair comments: cues for  sequence   Wheelchair Mobility    Modified Rankin (Stroke Patients Only)       Balance Overall balance assessment: Mild deficits observed, not formally tested                                          Cognition Arousal/Alertness: Awake/alert                                              Exercises      General Comments        Pertinent Vitals/Pain Pain Assessment Pain Score: 2  Pain Location: left knee Pain Descriptors / Indicators: Discomfort, Grimacing Pain Intervention(s): Premedicated before session    Home Living                          Prior Function            PT Goals (current goals can now be found in the care plan section) Progress towards PT goals: Progressing toward goals    Frequency    7X/week      PT Plan Current plan remains appropriate    Co-evaluation              AM-PAC PT "6 Clicks" Mobility   Outcome Measure  Help needed turning from your back to your side while in a flat bed without using  bedrails?: A Little Help needed moving from lying on your back to sitting on the side of a flat bed without using bedrails?: A Little Help needed moving to and from a bed to a chair (including a wheelchair)?: A Little Help needed standing up from a chair using your arms (e.g., wheelchair or bedside chair)?: A Little Help needed to walk in hospital room?: A Little Help needed climbing 3-5 steps with a railing? : A Little 6 Click Score: 18    End of Session Equipment Utilized During Treatment: Gait belt Activity Tolerance: Patient tolerated treatment well Patient left: in chair;with call bell/phone within reach;with chair alarm set Nurse Communication: Mobility status PT Visit Diagnosis: Unsteadiness on feet (R26.81);Difficulty in walking, not elsewhere classified (R26.2)     Time: 9937-1696 PT Time Calculation (min) (ACUTE ONLY): 29 min  Charges:  $Gait Training: 8-22  mins $Therapeutic Exercise: 8-22 mins                     [place] Office [phone] Weekend [phone]    Claretha Cooper 12/02/2022, 3:35 PM

## 2022-12-02 NOTE — Progress Notes (Signed)
   Subjective: 1 Day Post-Op Procedure(s) (LRB): TOTAL KNEE ARTHROPLASTY (Left) Patient seen in rounds by Dr. Wynelle Link. Patient is well, and has had no acute complaints or problems. Denies SOB or chest pain. Denies calf pain. Foley cath removed this AM. Patient reports pain as mild. Worked with physical therapy yesterday and ambulated 20'. We will continue physical therapy today.  Objective: Vital signs in last 24 hours: Temp:  [97.4 F (36.3 C)-97.7 F (36.5 C)] 97.7 F (36.5 C) (01/16 0527) Pulse Rate:  [56-70] 66 (01/16 0527) Resp:  [10-19] 18 (01/16 0527) BP: (97-129)/(51-74) 107/62 (01/16 0527) SpO2:  [93 %-100 %] 98 % (01/16 0527)  Intake/Output from previous day:  Intake/Output Summary (Last 24 hours) at 12/02/2022 0729 Last data filed at 12/02/2022 9983 Gross per 24 hour  Intake 3842.5 ml  Output 1650 ml  Net 2192.5 ml     Intake/Output this shift: No intake/output data recorded.  Labs: Recent Labs    12/02/22 0439  HGB 9.5*   Recent Labs    12/02/22 0439  WBC 13.7*  RBC 3.14*  HCT 28.8*  PLT 224   Recent Labs    12/02/22 0439  NA 134*  K 4.5  CL 105  CO2 23  BUN 17  CREATININE 0.68  GLUCOSE 162*  CALCIUM 8.4*   No results for input(s): "LABPT", "INR" in the last 72 hours.  Exam: General - Patient is Alert and Oriented Extremity - Neurologically intact Neurovascular intact Sensation intact distally Dorsiflexion/Plantar flexion intact Dressing - dressing C/D/I Motor Function - intact, moving foot and toes well on exam.  Past Medical History:  Diagnosis Date   Anemia    Po iron   Arthritis    Bilateral cataracts    Essential hypertension    GERD (gastroesophageal reflux disease)    Heart murmur    History of kidney stones    Hyperlipidemia    Neuromuscular disorder (HCC)    Osteoporosis    Type 2 diabetes mellitus (HCC)     Assessment/Plan: 1 Day Post-Op Procedure(s) (LRB): TOTAL KNEE ARTHROPLASTY (Left) Principal Problem:    OA (osteoarthritis) of knee Active Problems:   Primary osteoarthritis of left knee  Estimated body mass index is 22.34 kg/m as calculated from the following:   Height as of this encounter: 5' 4.5" (1.638 m).   Weight as of this encounter: 60 kg. Advance diet Up with therapy D/C IV fluids  Patient's anticipated LOS is less than 2 midnights, meeting these requirements: - Lives within 1 hour of care - Has a competent adult at home to recover with post-op - NO history of  - Chronic pain requiring opiods  - Coronary Artery Disease  - Heart failure  - Heart attack  - Stroke  - DVT/VTE  - Cardiac arrhythmia  - Respiratory Failure/COPD  - Renal failure  - Anemia  - Advanced Liver disease  DVT Prophylaxis - Aspirin Weight bearing as tolerated.  Continue physical therapy. Expected discharge home today pending progress with PT, if meeting patient goals, and if symptoms managed. Scheduled for OPP[facility]. Follow-up in clinic in 2 weeks.  The PDMP database was reviewed today prior to any opioid medications being prescribed to this patient.  R. Jaynie Bream, PA-C Orthopedic Surgery [phone] 12/02/2022, 7:29 AM

## 2022-12-02 NOTE — Progress Notes (Signed)
Discharge instructions given to patient and all questions were answered.  

## 2022-12-03 NOTE — Discharge Summary (Signed)
Physician Discharge Summary   Patient ID: Lindsay Villarreal MRN: [id number] DOB/AGE: 04/13/1946 77 y.o.  Admit date: 12/01/2022 Discharge date: 12/02/2022  Primary Diagnosis: Osteoarthritis left knee   Admission Diagnoses:  Past Medical History:  Diagnosis Date   Anemia    Po iron   Arthritis    Bilateral cataracts    Essential hypertension    GERD (gastroesophageal reflux disease)    Heart murmur    History of kidney stones    Hyperlipidemia    Neuromuscular disorder (HCC)    Osteoporosis    Type 2 diabetes mellitus ([place])    Discharge Diagnoses:   Principal Problem:   OA (osteoarthritis) of knee Active Problems:   Primary osteoarthritis of left knee  Estimated body mass index is 22.34 kg/m as calculated from the following:   Height as of this encounter: 5' 4.5" (1.638 m).   Weight as of this encounter: 60 kg.  Procedure:  Procedure(s) (LRB): TOTAL KNEE ARTHROPLASTY (Left)   Consults: None  HPI: Lindsay Villarreal is a 77 y.o. year old female with end stage OA of her left knee with progressively worsening pain and dysfunction. She has constant pain, with activity and at rest and significant functional deficits with difficulties even with ADLs. She has had extensive non-op management including analgesics, injections of cortisone and viscosupplements, and home exercise program, but remains in significant pain with significant dysfunction. Radiographs show bone on bone arthritis medial and patellofemoral. She presents now for left Total Knee Arthroplasty.  Laboratory Data: Admission on 12/01/2022, Discharged on 12/02/2022  Component Date Value Ref Range Status   Glucose-Capillary 12/01/2022 120 (H)  70 - 99 mg/dL Final   Glucose reference range applies only to samples taken after fasting for at least 8 hours.   Glucose-Capillary 12/01/2022 131 (H)  70 - 99 mg/dL Final   Glucose reference range applies only to samples taken after fasting for at least 8 hours.   Comment 1  12/01/2022 Notify RN   Final   Comment 2 12/01/2022 Document in Chart   Final   WBC 12/02/2022 13.7 (H)  4.0 - 10.5 K/uL Final   RBC 12/02/2022 3.14 (L)  3.87 - 5.11 MIL/uL Final   Hemoglobin 12/02/2022 9.5 (L)  12.0 - 15.0 g/dL Final   HCT 12/02/2022 28.8 (L)  36.0 - 46.0 % Final   MCV 12/02/2022 91.7  80.0 - 100.0 fL Final   MCH 12/02/2022 30.3  26.0 - 34.0 pg Final   MCHC 12/02/2022 33.0  30.0 - 36.0 g/dL Final   RDW 12/02/2022 12.7  11.5 - 15.5 % Final   Platelets 12/02/2022 224  150 - 400 K/uL Final   nRBC 12/02/2022 0.0  0.0 - 0.2 % Final   Performed at [facility], [place] [address], [place], [place] [address]   Sodium 12/02/2022 134 (L)  135 - 145 mmol/L Final   Potassium 12/02/2022 4.5  3.5 - 5.1 mmol/L Final   Chloride 12/02/2022 105  98 - 111 mmol/L Final   CO2 12/02/2022 23  22 - 32 mmol/L Final   Glucose, Bld 12/02/2022 162 (H)  70 - 99 mg/dL Final   Glucose reference range applies only to samples taken after fasting for at least 8 hours.   BUN 12/02/2022 17  8 - 23 mg/dL Final   Creatinine, Ser 12/02/2022 0.68  0.44 - 1.00 mg/dL Final   Calcium 12/02/2022 8.4 (L)  8.9 - 10.3 mg/dL Final   GFR, Estimated 12/02/2022 >60  >  60 mL/min Final   Comment: (NOTE) Calculated using the CKD-EPI Creatinine Equation (2021)    Anion gap 12/02/2022 6  5 - 15 Final   Performed at [facility], [place] [address] [place], [place] [address]  Hospital Outpatient Visit on 11/21/2022  Component Date Value Ref Range Status   MRSA, PCR 11/21/2022 NEGATIVE  NEGATIVE Final   Staphylococcus aureus 11/21/2022 NEGATIVE  NEGATIVE Final   Comment: (NOTE) The Xpert SA Assay (FDA approved for NASAL specimens in patients 55 years of age and older), is one component of a comprehensive surveillance program. It is not intended to diagnose infection nor to guide or monitor treatment. Performed at [facility], [place] [address], [place], [place]  [address]    Sodium 11/21/2022 138  135 - 145 mmol/L Final   Potassium 11/21/2022 3.8  3.5 - 5.1 mmol/L Final   Chloride 11/21/2022 105  98 - 111 mmol/L Final   CO2 11/21/2022 25  22 - 32 mmol/L Final   Glucose, Bld 11/21/2022 123 (H)  70 - 99 mg/dL Final   Glucose reference range applies only to samples taken after fasting for at least 8 hours.   BUN 11/21/2022 18  8 - 23 mg/dL Final   Creatinine, Ser 11/21/2022 0.67  0.44 - 1.00 mg/dL Final   Calcium 11/21/2022 9.0  8.9 - 10.3 mg/dL Final   GFR, Estimated 11/21/2022 >60  >60 mL/min Final   Comment: (NOTE) Calculated using the CKD-EPI Creatinine Equation (2021)    Anion gap 11/21/2022 8  5 - 15 Final   Performed at [facility], [place] [address], [place], [place] [address]   WBC 11/21/2022 8.8  4.0 - 10.5 K/uL Final   RBC 11/21/2022 4.17  3.87 - 5.11 MIL/uL Final   Hemoglobin 11/21/2022 12.5  12.0 - 15.0 g/dL Final   HCT 11/21/2022 38.7  36.0 - 46.0 % Final   MCV 11/21/2022 92.8  80.0 - 100.0 fL Final   MCH 11/21/2022 30.0  26.0 - 34.0 pg Final   MCHC 11/21/2022 32.3  30.0 - 36.0 g/dL Final   RDW 11/21/2022 12.8  11.5 - 15.5 % Final   Platelets 11/21/2022 284  150 - 400 K/uL Final   nRBC 11/21/2022 0.0  0.0 - 0.2 % Final   Performed at [facility], [place] [address], [place],  [address]   Glucose-Capillary 11/21/2022 124 (H)  70 - 99 mg/dL Final   Glucose reference range applies only to samples taken after fasting for at least 8 hours.  Office Visit on 10/16/2022  Component Date Value Ref Range Status   HB A1C (BAYER DCA - WAIVED) 10/16/2022 6.6 (H)  4.8 - 5.6 % Final   Comment:          Prediabetes: 5.7 - 6.4          Diabetes: >6.4          Glycemic control for adults with diabetes: <7.0    WBC 10/16/2022 7.4  3.4 - 10.8 x10E3/uL Final   RBC 10/16/2022 4.28  3.77 - 5.28 x10E6/uL Final   Hemoglobin 10/16/2022 12.7  11.1 - 15.9 g/dL Final   Hematocrit 10/16/2022 37.8  34.0 - 46.6 % Final    MCV 10/16/2022 88  79 - 97 fL Final   MCH 10/16/2022 29.7  26.6 - 33.0 pg Final   MCHC 10/16/2022 33.6  31.5 - 35.7 g/dL Final   RDW 10/16/2022 12.2  11.7 - 15.4 %  Final   Platelets 10/16/2022 294  150 - 450 x10E3/uL Final   Neutrophils 10/16/2022 60  Not Estab. % Final   Lymphs 10/16/2022 31  Not Estab. % Final   Monocytes 10/16/2022 7  Not Estab. % Final   Eos 10/16/2022 1  Not Estab. % Final   Basos 10/16/2022 1  Not Estab. % Final   Neutrophils Absolute 10/16/2022 4.4  1.4 - 7.0 x10E3/uL Final   Lymphocytes Absolute 10/16/2022 2.3  0.7 - 3.1 x10E3/uL Final   Monocytes Absolute 10/16/2022 0.5  0.1 - 0.9 x10E3/uL Final   EOS (ABSOLUTE) 10/16/2022 0.1  0.0 - 0.4 x10E3/uL Final   Basophils Absolute 10/16/2022 0.1  0.0 - 0.2 x10E3/uL Final   Immature Granulocytes 10/16/2022 0  Not Estab. % Final   Immature Grans (Abs) 10/16/2022 0.0  0.0 - 0.1 x10E3/uL Final   Glucose 10/16/2022 138 (H)  70 - 99 mg/dL Final   BUN 10/16/2022 14  8 - 27 mg/dL Final   Creatinine, Ser 10/16/2022 0.63  0.57 - 1.00 mg/dL Final   eGFR 10/16/2022 92  >59 mL/min/1.73 Final   BUN/Creatinine Ratio 10/16/2022 22  12 - 28 Final   Sodium 10/16/2022 145 (H)  134 - 144 mmol/L Final   Potassium 10/16/2022 3.8  3.5 - 5.2 mmol/L Final   Chloride 10/16/2022 105  96 - 106 mmol/L Final   CO2 10/16/2022 25  20 - 29 mmol/L Final   Calcium 10/16/2022 9.6  8.7 - 10.3 mg/dL Final   Total Protein 10/16/2022 6.0  6.0 - 8.5 g/dL Final   Albumin 10/16/2022 4.2  3.8 - 4.8 g/dL Final   Globulin, Total 10/16/2022 1.8  1.5 - 4.5 g/dL Final   Albumin/Globulin Ratio 10/16/2022 2.3 (H)  1.2 - 2.2 Final   Bilirubin Total 10/16/2022 0.4  0.0 - 1.2 mg/dL Final   Alkaline Phosphatase 10/16/2022 52  44 - 121 IU/L Final   AST 10/16/2022 14  0 - 40 IU/L Final   ALT 10/16/2022 10  0 - 32 IU/L Final   Cholesterol, Total 10/16/2022 116  100 - 199 mg/dL Final   Triglycerides 10/16/2022 128  0 - 149 mg/dL Final   HDL 10/16/2022 48  >39 mg/dL  Final   VLDL Cholesterol Cal 10/16/2022 23  5 - 40 mg/dL Final   LDL Chol Calc (NIH) 10/16/2022 45  0 - 99 mg/dL Final   Chol/HDL Ratio 10/16/2022 2.4  0.0 - 4.4 ratio Final   Comment:                                   T. Chol/HDL Ratio                                             Men  Women                               1/2 Avg.Risk  3.4    3.3                                   Avg.Risk  5.0    4.4  2X Avg.Risk  9.6    7.1                                3X Avg.Risk 23.4   11.0      X-Rays:No results found.  EKG: Orders placed or performed in visit on 10/16/22   EKG 12-Lead     Hospital Course: TIMMYA BLAZIER is a 77 y.o. who was admitted to [facility]. They were brought to the operating room on 12/01/2022 and underwent Procedure(s): TOTAL KNEE ARTHROPLASTY.  Patient tolerated the procedure well and was later transferred to the recovery room and then to the orthopaedic floor for postoperative care. They were given PO and IV analgesics for pain control following their surgery. They were given 24 hours of postoperative antibiotics of  Anti-infectives (From admission, onward)    Start     Dose/Rate Route Frequency Ordered Stop   12/01/22 1400  ceFAZolin (ANCEF) IVPB 2g/100 mL premix        2 g 200 mL/hr over 30 Minutes Intravenous Every 6 hours 12/01/22 1119 12/01/22 2100   12/01/22 0600  ceFAZolin (ANCEF) IVPB 2g/100 mL premix        2 g 200 mL/hr over 30 Minutes Intravenous On call to O.R. 12/01/22 7494 12/01/22 0806      and started on DVT prophylaxis in the form of Aspirin.   PT and OT were ordered for total joint protocol. Discharge planning consulted to help with postop disposition and equipment needs.  Patient had a good night on the evening of surgery. They started to get up OOB with therapy on POD #0. Pt was seen during rounds and was ready to go home pending progress with therapy. She worked with therapy on POD #1 and was meeting her  goals. Pt was discharged to home later that day in stable condition.  Diet: Diabetic diet Activity: WBAT Follow-up: in 2 weeks Disposition: Home Discharged Condition: serious   Discharge Instructions     Call MD / Call 911   Complete by: As directed    If you experience chest pain or shortness of breath, CALL 911 and be transported to the hospital emergency room.  If you develope a fever above 101 F, pus (white drainage) or increased drainage or redness at the wound, or calf pain, call your surgeon's office.   Change dressing   Complete by: As directed    You may remove the bulky bandage (ACE wrap and gauze) two days after surgery. You will have an adhesive waterproof bandage underneath. Leave this in place until your first follow-up appointment.   Constipation Prevention   Complete by: As directed    Drink plenty of fluids.  Prune juice may be helpful.  You may use a stool softener, such as Colace (over the counter) 100 mg twice a day.  Use MiraLax (over the counter) for constipation as needed.   Diet - low sodium heart healthy   Complete by: As directed    Do not put a pillow under the knee. Place it under the heel.   Complete by: As directed    Driving restrictions   Complete by: As directed    No driving for two weeks   Post-operative opioid taper instructions:   Complete by: As directed    POST-OPERATIVE OPIOID TAPER INSTRUCTIONS: It is important to wean off of your opioid medication as soon as possible. If you do not  need pain medication after your surgery it is ok to stop day one. Opioids include: Codeine, Hydrocodone(Norco, Vicodin), Oxycodone(Percocet, oxycontin) and hydromorphone amongst others.  Long term and even short term use of opiods can cause: Increased pain response Dependence Constipation Depression Respiratory depression And more.  Withdrawal symptoms can include Flu like symptoms Nausea, vomiting And more Techniques to manage these symptoms Hydrate  well Eat regular healthy meals Stay active Use relaxation techniques(deep breathing, meditating, yoga) Do Not substitute Alcohol to help with tapering If you have been on opioids for less than two weeks and do not have pain than it is ok to stop all together.  Plan to wean off of opioids This plan should start within one week post op of your joint replacement. Maintain the same interval or time between taking each dose and first decrease the dose.  Cut the total daily intake of opioids by one tablet each day Next start to increase the time between doses. The last dose that should be eliminated is the evening dose.      TED hose   Complete by: As directed    Use stockings (TED hose) for three weeks on both leg(s).  You may remove them at night for sleeping.   Weight bearing as tolerated   Complete by: As directed       Allergies as of 12/02/2022       Reactions   Codeine Other (See Comments)   Hallucinations.        Medication List     STOP taking these medications    aspirin 81 MG tablet Replaced by: aspirin EC 325 MG tablet   ibuprofen 200 MG tablet Commonly known as: ADVIL   naproxen sodium 220 MG tablet Commonly known as: ALEVE       TAKE these medications    ARTHRITIS PAIN RELIEF EX Apply 1 Application topically daily as needed (pain).   aspirin EC 325 MG tablet Take 1 tablet (325 mg total) by mouth 2 (two) times daily for 20 days. Then resume one 81 mg aspirin once a day Replaces: aspirin 81 MG tablet   b complex vitamins capsule Take 1 capsule by mouth daily.   Biotin 10000 MCG Tabs Take 10,000 mcg by mouth daily.   blood glucose meter kit and supplies Dispense based on patient and insurance preference. Use up to four times daily as directed. Dx E11.8   CALCIUM 600 + D PO Take 1 tablet by mouth daily.   denosumab 60 MG/ML Soln injection Commonly known as: PROLIA Inject 60 mg into the skin every 6 (six) months. Administer in upper arm, thigh,  or abdomen   gabapentin 100 MG capsule Commonly known as: NEURONTIN Take 1 capsule (100 mg total) by mouth 3 (three) times daily.   HYDROmorphone 2 MG tablet Commonly known as: DILAUDID Take 1-2 tablets (2-4 mg total) by mouth every 8 (eight) hours as needed for severe pain.   Iron 325 (65 Fe) MG Tabs Take 1 tablet (325 mg total) by mouth daily.   lisinopril 5 MG tablet Commonly known as: ZESTRIL Take 1 tablet (5 mg total) by mouth daily.   metFORMIN 500 MG tablet Commonly known as: GLUCOPHAGE Take 1 tablet (500 mg total) by mouth 2 (two) times daily.   methocarbamol 500 MG tablet Commonly known as: ROBAXIN Take 1 tablet (500 mg total) by mouth every 6 (six) hours as needed for muscle spasms.   OneTouch Delica Lancets 91Y Misc USE ONE  TO CHECK GLUCOSE  ONCE DAILY Dx E11.40   OneTouch Verio test strip Generic drug: glucose blood Test BS QID Dx E11.40   polyvinyl alcohol 1.4 % ophthalmic solution Commonly known as: LIQUIFILM TEARS Place 1 drop into both eyes as needed for dry eyes.   simvastatin 20 MG tablet Commonly known as: ZOCOR Take 1 tablet (20 mg total) by mouth daily.   traMADol 50 MG tablet Commonly known as: ULTRAM Take 1-2 tablets (50-100 mg total) by mouth every 6 (six) hours as needed for moderate pain.   Vitamin D3 10 MCG (400 UNIT) tablet Take 400 Units by mouth daily.   Zinc 50 MG Tabs Take 50 mg by mouth daily.   ZzzQuil 50 MG/30ML Liqd Generic drug: diphenhydrAMINE HCl (Sleep) Take 50 mg by mouth at bedtime.               Discharge Care Instructions  (From admission, onward)           Start     Ordered   12/02/22 0000  Weight bearing as tolerated        12/02/22 0733   12/02/22 0000  Change dressing       Comments: You may remove the bulky bandage (ACE wrap and gauze) two days after surgery. You will have an adhesive waterproof bandage underneath. Leave this in place until your first follow-up appointment.   12/02/22 7673             Follow-up Information     Gaynelle Arabian, MD. Go on 12/16/2022.   Specialty: Orthopedic Surgery Why: You are scheduled for a follow up appointment on 12-16-22 at 1:30 pm. Contact information: [address] [place] [address] [phone]                 Signed: R. Jaynie Bream, PA-C Orthopedic Surgery 12/03/2022, 8:37 AM

## 2022-12-04 ENCOUNTER — Other Ambulatory Visit: Payer: Self-pay

## 2022-12-04 ENCOUNTER — Ambulatory Visit: Payer: Medicare HMO | Attending: Orthopedic Surgery

## 2022-12-04 DIAGNOSIS — R600 Localized edema: Secondary | ICD-10-CM | POA: Diagnosis present

## 2022-12-04 DIAGNOSIS — M25662 Stiffness of left knee, not elsewhere classified: Secondary | ICD-10-CM | POA: Insufficient documentation

## 2022-12-04 DIAGNOSIS — M25562 Pain in left knee: Secondary | ICD-10-CM | POA: Insufficient documentation

## 2022-12-04 NOTE — Therapy (Signed)
OUTPATIENT PHYSICAL THERAPY LOWER EXTREMITY EVALUATION   Patient Name: Lindsay Villarreal MRN: [id number] DOB:Aug 18, 1946, 77 y.o., female Today's Date: 12/04/2022  END OF SESSION:  PT End of Session - 12/04/22 0947     Visit Number 1    Number of Visits 12    Date for PT Re-Evaluation 01/02/23    PT Start Time 0948    PT Stop Time 1020    PT Time Calculation (min) 32 min    Activity Tolerance Patient tolerated treatment well    Behavior During Therapy [facility] for tasks assessed/performed             Past Medical History:  Diagnosis Date   Anemia    Po iron   Arthritis    Bilateral cataracts    Essential hypertension    GERD (gastroesophageal reflux disease)    Heart murmur    History of kidney stones    Hyperlipidemia    Neuromuscular disorder ([place])    Osteoporosis    Type 2 diabetes mellitus ([place])    Past Surgical History:  Procedure Laterality Date   APPENDECTOMY  11/17/1976   BLADDER REPAIR     CATARACT EXTRACTION W/ INTRAOCULAR LENS IMPLANT  2010 (left), 2013 (right)   COLONOSCOPY  04/30/2015   Dr.Perry   COLONOSCOPY  2013   EYE SURGERY     POLYPECTOMY     TOTAL KNEE ARTHROPLASTY Left 12/01/2022   Procedure: TOTAL KNEE ARTHROPLASTY;  Surgeon: Gaynelle Arabian, MD;  Location: [facility];  Service: Orthopedics;  Laterality: Left;   TUBAL LIGATION     VAGINAL HYSTERECTOMY  11/17/1976   XI ROBOTIC ASSISTED INGUINAL HERNIA REPAIR WITH MESH Bilateral 02/22/2021   Procedure: XI ROBOTIC ASSISTED BILATERAL INGUINAL HERNIA REPAIR;  Surgeon: Clovis Riley, MD;  Location: [facility];  Service: General;  Laterality: Bilateral;  2.5 HOURS   Patient Active Problem List   Diagnosis Date Noted   OA (osteoarthritis) of knee 12/01/2022   Primary osteoarthritis of left knee 12/01/2022   Inguinal hernia of right side without obstruction or gangrene 07/12/2020   Heart murmur 07/12/2020   Inguinal hernia of left side without obstruction or gangrene 07/12/2020   Gastroesophageal reflux  disease without esophagitis 10/27/2016   Diabetic neuropathy associated with type 2 diabetes mellitus ([place]) 02/12/2015   Type 2 diabetes mellitus with complication, without long-term current use of insulin ([place]) 06/02/2013   Hypertension 06/02/2013   Anemia 12/16/2011   Osteoporosis 11/26/2011   Hyperlipidemia 11/26/2011    PCP: [name], FNP  REFERRING PROVIDER: Gaynelle Arabian, MD   REFERRING DIAG: S/P Left Total Knee Athroplasty   THERAPY DIAG:  Acute pain of left knee  Stiffness of left knee, not elsewhere classified  Localized edema  Rationale for Evaluation and Treatment: Rehabilitation  ONSET DATE: 12/01/22  SUBJECTIVE:   SUBJECTIVE STATEMENT: Patient reports that she had a left knee replacement on 12/01/21. She notes that her knee has been hurting really bad since surgery.   PERTINENT HISTORY: Hypertension, diabetes, osteoporosis, osteoarthritis, and current smoker PAIN:  Are you having pain? Yes: NPRS scale: 10/10 Pain location: left leg Pain description: sensitive, severe, stabbing Aggravating factors: moving her leg Relieving factors: ice, medication  PRECAUTIONS: None  WEIGHT BEARING RESTRICTIONS: No  FALLS:  Has patient fallen in last 6 months? No  LIVING ENVIRONMENT: Lives with: lives with their family Lives in: House/apartment Stairs: Yes: External: 5 steps; on right going up Has following equipment at home: Gilford Rile - 2 wheeled  OCCUPATION: retired  PLOF: Independent  PATIENT GOALS: reduced pain, play with her grandchild, improved mobility, and walk without a walker  NEXT MD VISIT: 2 weeks from surgery  OBJECTIVE:  PATIENT SURVEYS:  FOTO 19.19  COGNITION: Overall cognitive status: Within functional limits for tasks assessed     SENSATION: Patient reports no numbness or tingling.   EDEMA:  Circumferential: L knee: 44 cm R: 37.5 cm   PALPATION: TTP: left quadriceps  LOWER EXTREMITY ROM:  Active ROM Right eval  Left eval  Hip flexion    Hip extension    Hip abduction    Hip adduction    Hip internal rotation    Hip external rotation    Knee flexion 120 41/47 (PROM)   Knee extension 0 9  Ankle dorsiflexion    Ankle plantarflexion    Ankle inversion    Ankle eversion     (Blank rows = not tested)  LOWER EXTREMITY MMT: not tested due to surgical condition  LOWER EXTREMITY SPECIAL TESTS:  Not tested due to surgical condition  GAIT: Assistive device utilized: Environmental consultant - 2 wheeled Level of assistance: Modified independence Comments: decreased gait speed, stride length, left knee flexed in stance, and decreased stance time on the left lower extremity   TODAY'S TREATMENT:                                                                                                                              DATE:                                     1/18 EXERCISE LOG  Exercise Repetitions and Resistance Comments  Quad sets  15 reps w/ 5 second hold                    Blank cell = exercise not performed today   PATIENT EDUCATION:  Education details: Plan of care, healing, prognosis, anatomy, and goals for therapy Person educated: Patient Education method: Explanation Education comprehension: verbalized understanding  HOME EXERCISE PROGRAM: Patient was recommended to perform quad sets at home in addition to the home exercise program provided during acute care physical therapy.  ASSESSMENT:  CLINICAL IMPRESSION: Patient is a 77 y.o. female who was seen today for physical therapy evaluation and treatment following a left total knee arthroplasty on 12/01/22. She presented with high pain severity and irritability with left knee ROM being the most aggravating to her familiar pain. She exhibited increase left knee edema along her joint line compared to her right knee. She exhibited no signs or symptoms of a DVT or infection. Recommend that she continue with skilled physical therapy to address her  impairments to return to her prior level of function.    OBJECTIVE IMPAIRMENTS: Abnormal gait, decreased activity tolerance, decreased balance, decreased mobility, difficulty walking, decreased ROM, decreased strength, hypomobility, increased edema, impaired flexibility, impaired tone,  and pain.   ACTIVITY LIMITATIONS: carrying, lifting, sitting, standing, squatting, sleeping, stairs, transfers, bed mobility, bathing, dressing, locomotion level, and caring for others  PARTICIPATION LIMITATIONS: meal prep, cleaning, laundry, driving, shopping, community activity, and yard work  PERSONAL FACTORS: Transportation and 3+ comorbidities: Hypertension, diabetes, osteoporosis, osteoarthritis, and current smoker  are also affecting patient's functional outcome.   REHAB POTENTIAL: Good  CLINICAL DECISION MAKING: Evolving/moderate complexity  EVALUATION COMPLEXITY: Moderate   GOALS: Goals reviewed with patient? Yes  LONG TERM GOALS: Target date: 01/01/23  Patient will be independent with her HEP. Baseline:  Goal status: INITIAL  2.  Patient will be able to safely ambulate at least 80 feet with a cane or the least restrictive assistive device for improved household mobility. Baseline:  Goal status: INITIAL  3.  Patient will be able to demonstrate active left knee extension within 5 degrees of neutral for improved gait mechanics.  Baseline:  Goal status: INITIAL  4.  Patient will be able to demonstrate at least 115 degrees of active left knee flexion for improved function navigating stairs. Baseline:  Goal status: INITIAL  PLAN:  PT FREQUENCY: 2-3x/week  PT DURATION: 4 weeks  PLANNED INTERVENTIONS: Therapeutic exercises, Therapeutic activity, Neuromuscular re-education, Balance training, Gait training, Patient/Family education, Self Care, Joint mobilization, Stair training, Electrical stimulation, Cryotherapy, Moist heat, Vasopneumatic device, Manual therapy, and Re-evaluation  PLAN  FOR NEXT SESSION: NuStep, quad sets, heel slides, gastroc and hamstring stretching, manual therapy, and modalities as needed   Darlin Coco, PT 12/04/2022, 6:13 PM

## 2022-12-05 ENCOUNTER — Ambulatory Visit (INDEPENDENT_AMBULATORY_CARE_PROVIDER_SITE_OTHER): Payer: Medicare HMO

## 2022-12-05 VITALS — Ht 63.0 in | Wt 130.0 lb

## 2022-12-05 DIAGNOSIS — Z0000 Encounter for general adult medical examination without abnormal findings: Secondary | ICD-10-CM | POA: Diagnosis not present

## 2022-12-05 NOTE — Progress Notes (Signed)
Subjective:   Lindsay Villarreal is a 77 y.o. female who presents for Medicare Annual (Subsequent) preventive examination. I connected with  Lindsay Villarreal on 12/05/22 by a audio enabled telemedicine application and verified that I am speaking with the correct person using two identifiers.  Patient Location: Home  Provider Location: Home Office  I discussed the limitations of evaluation and management by telemedicine. The patient expressed understanding and agreed to proceed.  Review of Systems     Cardiac Risk Factors include: advanced age (>26mn, >>39women);hypertension;diabetes mellitus     Objective:    Today's Vitals   12/05/22 0822  Weight: 130 lb (59 kg)  Height: '5\' 3"'$  (1.6 m)   Body mass index is 23.03 kg/m.     12/05/2022    8:27 AM 12/04/2022    3:53 PM 12/01/2022   11:30 AM 12/01/2022   11:00 AM 11/21/2022    9:16 AM 09/03/2021   10:38 AM 02/22/2021    7:56 AM  Advanced Directives  Does Patient Have a Medical Advance Directive? Yes Yes  Yes Yes No No  Type of [place] will  Healthcare Power of [place]   Does patient want to make changes to medical advance directive?   No - Patient declined      Copy of HBall[place]in Chart? No - copy requested   No - copy requested No - copy requested    Would patient like information on creating a medical advance directive?      No - Patient declined No - Patient declined    Current Medications (verified) Outpatient Encounter Medications as of 12/05/2022  Medication Sig   aspirin EC 325 MG tablet Take 1 tablet (325 mg total) by mouth 2 (two) times daily for 20 days. Then resume one 81 mg aspirin once a day   b complex vitamins capsule Take 1 capsule by mouth daily.   Biotin 10000 MCG TABS Take 10,000 mcg by mouth daily.   blood glucose meter kit and supplies Dispense based on patient and insurance preference. Use  up to four times daily as directed. Dx E11.8   Calcium Carb-Cholecalciferol (CALCIUM 600 + D PO) Take 1 tablet by mouth daily.   Cholecalciferol (VITAMIN D3) 10 MCG (400 UNIT) tablet Take 400 Units by mouth daily.   denosumab (PROLIA) 60 MG/ML SOLN injection Inject 60 mg into the skin every 6 (six) months. Administer in upper arm, thigh, or abdomen   diphenhydrAMINE HCl, Sleep, (ZZZQUIL) 50 MG/30ML LIQD Take 50 mg by mouth at bedtime.   Ferrous Sulfate (IRON) 325 (65 Fe) MG TABS Take 1 tablet (325 mg total) by mouth daily.   gabapentin (NEURONTIN) 100 MG capsule Take 1 capsule (100 mg total) by mouth 3 (three) times daily.   glucose blood (ONETOUCH VERIO) test strip Test BS QID Dx E11.40   HYDROmorphone (DILAUDID) 2 MG tablet Take 1-2 tablets (2-4 mg total) by mouth every 8 (eight) hours as needed for severe pain.   lisinopril (ZESTRIL) 5 MG tablet Take 1 tablet (5 mg total) by mouth daily.   Menthol-Methyl Salicylate (ARTHRITIS PAIN RELIEF EX) Apply 1 Application topically daily as needed (pain).   metFORMIN (GLUCOPHAGE) 500 MG tablet Take 1 tablet (500 mg total) by mouth 2 (two) times daily.   methocarbamol (ROBAXIN) 500 MG tablet Take 1 tablet (500 mg total) by mouth every 6 (six) hours as needed for  muscle spasms.   OneTouch Delica Lancets 16X MISC USE ONE  TO CHECK GLUCOSE ONCE DAILY Dx E11.40   polyvinyl alcohol (LIQUIFILM TEARS) 1.4 % ophthalmic solution Place 1 drop into both eyes as needed for dry eyes.   simvastatin (ZOCOR) 20 MG tablet Take 1 tablet (20 mg total) by mouth daily.   traMADol (ULTRAM) 50 MG tablet Take 1-2 tablets (50-100 mg total) by mouth every 6 (six) hours as needed for moderate pain.   Zinc 50 MG TABS Take 50 mg by mouth daily.   No facility-administered encounter medications on file as of 12/05/2022.    Allergies (verified) Codeine   History: Past Medical History:  Diagnosis Date   Anemia    Po iron   Arthritis    Bilateral cataracts    Essential  hypertension    GERD (gastroesophageal reflux disease)    Heart murmur    History of kidney stones    Hyperlipidemia    Neuromuscular disorder ([place])    Osteoporosis    Type 2 diabetes mellitus ([place])    Past Surgical History:  Procedure Laterality Date   APPENDECTOMY  11/17/1976   BLADDER REPAIR     CATARACT EXTRACTION W/ INTRAOCULAR LENS IMPLANT  2010 (left), 2013 (right)   COLONOSCOPY  04/30/2015   Dr.Perry   COLONOSCOPY  2013   EYE SURGERY     POLYPECTOMY     TOTAL KNEE ARTHROPLASTY Left 12/01/2022   Procedure: TOTAL KNEE ARTHROPLASTY;  Surgeon: Gaynelle Arabian, MD;  Location: [facility];  Service: Orthopedics;  Laterality: Left;   TUBAL LIGATION     VAGINAL HYSTERECTOMY  11/17/1976   XI ROBOTIC ASSISTED INGUINAL HERNIA REPAIR WITH MESH Bilateral 02/22/2021   Procedure: XI ROBOTIC ASSISTED BILATERAL INGUINAL HERNIA REPAIR;  Surgeon: Clovis Riley, MD;  Location: [facility];  Service: General;  Laterality: Bilateral;  2.5 HOURS   Family History  Problem Relation Age of Onset   Heart attack Mother    Heart failure Mother    Stomach cancer Father    Pancreatic cancer Father    Liver cancer Father    Other Sister 36       Cancer of Bile Duct   Breast cancer Sister    Diabetes Sister    Brain cancer Sister 76   Sarcoidosis Sister    Colon cancer Neg Hx    Esophageal cancer Neg Hx    Rectal cancer Neg Hx    Colon polyps Neg Hx    Social History   Socioeconomic History   Marital status: Divorced    Spouse name: Not on file   Number of children: 3   Years of education: 12   Highest education level: High school graduate  Occupational History   Occupation: Teacher, English as a foreign language, Paediatric nurse    Comment: retired  Tobacco Use   Smoking status: Every Day    Packs/day: 0.50    Years: 50.00    Total pack years: 25.00    Types: Cigarettes   Smokeless tobacco: Never  Vaping Use   Vaping Use: Never used  Substance and Sexual Activity   Alcohol use: No   Drug use: No   Sexual  activity: Not Currently  Other Topics Concern   Not on file  Social History Narrative   Lives alone - one level. Children live close by   Social Determinants of Health   Financial Resource Strain: Low Risk  (12/05/2022)   Overall Financial Resource Strain (CARDIA)    Difficulty of Paying Living  Expenses: Not hard at all  Food Insecurity: No Food Insecurity (12/05/2022)   Hunger Vital Sign    Worried About Running Out of Food in the Last Year: Never true    Ran Out of Food in the Last Year: Never true  Transportation Needs: No Transportation Needs (12/05/2022)   PRAPARE - Hydrologist (Medical): No    Lack of Transportation (Non-Medical): No  Physical Activity: Insufficiently Active (12/05/2022)   Exercise Vital Sign    Days of Exercise per Week: 3 days    Minutes of Exercise per Session: 30 min  Stress: No Stress Concern Present (12/05/2022)   [place]    Feeling of Stress : Not at all  Social Connections: Socially Isolated (12/05/2022)   Social Connection and Isolation Panel [NHANES]    Frequency of Communication with Friends and Family: More than three times a week    Frequency of Social Gatherings with Friends and Family: More than three times a week    Attends Religious Services: Never    Marine scientist or Organizations: No    Attends Music therapist: Never    Marital Status: Divorced    Tobacco Counseling Ready to quit: Not Answered Counseling given: Not Answered   Clinical Intake:  Pre-visit preparation completed: Yes  Pain : No/denies pain     Nutritional Risks: None Diabetes: Yes CBG done?: No Did pt. bring in CBG monitor from home?: No  How often do you need to have someone help you when you read instructions, pamphlets, or other written materials from your doctor or pharmacy?: 1 - Never  Diabetic?yes Nutrition Risk Assessment:  Has the  patient had any N/V/D within the last 2 months?  No  Does the patient have any non-healing wounds?  No  Has the patient had any unintentional weight loss or weight gain?  No   Diabetes:  Is the patient diabetic?  Yes  If diabetic, was a CBG obtained today?  No  Did the patient bring in their glucometer from home?  No  How often do you monitor your CBG's? Daily .   Financial Strains and Diabetes Management:  Are you having any financial strains with the device, your supplies or your medication? No .  Does the patient want to be seen by Chronic Care Management for management of their diabetes?  No  Would the patient like to be referred to a Nutritionist or for Diabetic Management?  No   Diabetic Exams:  Diabetic Eye Exam: Completed 12/2021 Diabetic Foot Exam: Overdue, Pt has been advised about the importance in completing this exam. Pt is scheduled for diabetic foot exam on next office visit .   Interpreter Needed?: No  Information entered by :: Jadene Pierini, LPN   Activities of Daily Living    12/05/2022    8:28 AM 12/01/2022   11:00 AM  In your present state of health, do you have any difficulty performing the following activities:  Hearing? 0 0  Vision? 0 0  Difficulty concentrating or making decisions? 0 0  Walking or climbing stairs? 0 0  Dressing or bathing? 0 0  Doing errands, shopping? 0 0  Preparing Food and eating ? N   Using the Toilet? N   In the past six months, have you accidently leaked urine? N   Do you have problems with loss of bowel control? N   Managing your Medications? N  Managing your Finances? N   Housekeeping or managing your Housekeeping? N     Patient Care Team: [name], FNP as PCP - General (Nurse Practitioner) Satira Sark, MD as PCP - Cardiology (Cardiology) Okey Regal, [place] as Consulting Physician (Optometry) Beryle Beams Alyson Locket, MD as Consulting Physician (Hematology) Irene Shipper, MD as Consulting Physician  (Gastroenterology)  Indicate any recent Medical Services you may have received from other than [facility] providers in the past year (date may be approximate).     Assessment:   This is a routine wellness examination for [place].  Hearing/Vision screen Vision Screening - Comments:: Wears rx glasses - up to date with routine eye exams with  Dr.Johnson   Dietary issues and exercise activities discussed: Current Exercise Habits: Home exercise routine, Type of exercise: walking, Time (Minutes): 30, Frequency (Times/Week): 3, Weekly Exercise (Minutes/Week): 90, Intensity: Mild, Exercise limited by: orthopedic condition(s)   Goals Addressed             This Visit's Progress    DIET - EAT MORE FRUITS AND VEGETABLES   On track      Depression Screen    12/05/2022    8:26 AM 10/16/2022    8:43 AM 04/15/2022    8:41 AM 10/22/2021    9:15 AM 09/17/2021   12:10 PM 09/03/2021   10:33 AM 04/22/2021   10:03 AM  PHQ 2/9 Scores  PHQ - 2 Score 0 0 2 0 0 0 0  PHQ- 9 Score 0 2 6 0 0  0    Fall Risk    12/05/2022    8:24 AM 10/16/2022    8:43 AM 04/15/2022    8:41 AM 10/22/2021    9:15 AM 09/17/2021   12:10 PM  Fall Risk   Falls in the past year? 0 0 0 0 0  Number falls in past yr: 0      Injury with Fall? 0      Risk for fall due to : No Fall Risks      Follow up Falls prevention discussed        [place]:  Any stairs in or around the home? No  If so, are there any without handrails? No  Home free of loose throw rugs in walkways, pet beds, electrical cords, etc? Yes  Adequate lighting in your home to reduce risk of falls? Yes   ASSISTIVE DEVICES UTILIZED TO PREVENT FALLS:  Life alert? No  Use of a cane, walker or w/c? Yes  Grab bars in the bathroom? Yes  Shower chair or bench in shower? No  Elevated toilet seat or a handicapped toilet? No       03/09/2017    9:40 AM 02/18/2016    9:43 AM 02/12/2015   10:37 AM  MMSE - Mini Mental State Exam   Orientation to time '4 5 5  '$ Orientation to Place '5 5 5  '$ Registration '3 3 3  '$ Attention/ Calculation '5 5 5  '$ Recall '3 3 3  '$ Language- name 2 objects '2 2 2  '$ Language- repeat '1 1 1  '$ Language- follow 3 step command '3 3 3  '$ Language- read & follow direction '1 1 1  '$ Write a sentence '1 1 1  '$ Copy design '1 1 1  '$ Total score '29 30 30        '$ 12/05/2022    8:28 AM 08/30/2020    8:41 AM 08/30/2019    8:36 AM  6CIT Screen  What Year? 0 points 0 points 0 points  What month? 0 points 0 points 0 points  What time? 0 points 0 points 0 points  Count back from 20 0 points 0 points 0 points  Months in reverse 0 points 0 points 0 points  Repeat phrase 0 points 0 points 0 points  Total Score 0 points 0 points 0 points    Immunizations Immunization History  Administered Date(s) Administered   Fluad Quad(high Dose 65+) 09/17/2021, 10/16/2022   Hepatitis B 12/01/2018   Hepatitis B, adult 02/22/2018, 12/01/2018   Influenza, High Dose Seasonal PF 10/04/2015, 09/04/2016, 09/17/2017, 08/07/2018, 10/11/2019   Influenza,inj,Quad PF,6+ Mos 09/09/2013, 08/14/2014   Moderna Sars-Covid-2 Vaccination 11/19/2019, 01/13/2020   Pneumococcal Conjugate-13 10/30/2014   Pneumococcal Polysaccharide-23 07/30/2012   Tdap 07/30/2012   Zoster Recombinat (Shingrix) 08/07/2018, 11/13/2018    TDAP status: Up to date  Flu Vaccine status: Up to date  Pneumococcal vaccine status: Up to date  Covid-19 vaccine status: Completed vaccines  Qualifies for Shingles Vaccine? Yes   Zostavax completed Yes   Shingrix Completed?: Yes  Screening Tests Health Maintenance  Topic Date Due   Lung Cancer Screening  03/23/2018   COVID-19 Vaccine (3 - 2023-24 season) 07/18/2022   OPHTHALMOLOGY EXAM  09/06/2022   Diabetic kidney evaluation - Urine ACR  04/16/2023   LIPID PANEL  04/16/2023   HEMOGLOBIN A1C  04/16/2023   FOOT EXAM  10/17/2023   Diabetic kidney evaluation - eGFR measurement  12/03/2023   Medicare Annual Wellness  (AWV)  12/06/2023   DEXA SCAN  10/16/2024   COLONOSCOPY (Pts 45-49yr Insurance coverage will need to be confirmed)  09/02/2026   Pneumonia Vaccine 77 Years old  Completed   INFLUENZA VACCINE  Completed   Hepatitis C Screening  Completed   Zoster Vaccines- Shingrix  Completed   HPV VACCINES  Aged Out   DTaP/Tdap/Td  Discontinued    Health Maintenance  Health Maintenance Due  Topic Date Due   Lung Cancer Screening  03/23/2018   COVID-19 Vaccine (3 - 2023-24 season) 07/18/2022   OPHTHALMOLOGY EXAM  09/06/2022    Colorectal cancer screening: No longer required.   Mammogram status: No longer required due to age.  Bone Density status: Completed 10/16/2022. Results reflect: Bone density results: OSTEOPENIA. Repeat every 5 years.  Lung Cancer Screening: (Low Dose CT Chest recommended if Age 77-80years, 30 pack-year currently smoking OR have quit w/in 15years.) does not qualify.   Lung Cancer Screening Referral: n/a  Additional Screening:  Hepatitis C Screening: does not qualify;  Vision Screening: Recommended annual ophthalmology exams for early detection of glaucoma and other disorders of the eye. Is the patient up to date with their annual eye exam?  Yes  Who is the provider or what is the name of the office in which the patient attends annual eye exams? Dr.Johnson  If pt is not established with a provider, would they like to be referred to a provider to establish care? No .   Dental Screening: Recommended annual dental exams for proper oral hygiene  Community Resource Referral / Chronic Care Management: CRR required this visit?  No   CCM required this visit?  No      Plan:     I have personally reviewed and noted the following in the patient's chart:   Medical and social history Use of alcohol, tobacco or illicit drugs  Current medications and supplements including opioid prescriptions. Patient is not currently taking opioid prescriptions. Functional ability and  status Nutritional status Physical activity Advanced directives List of other physicians Hospitalizations, surgeries, and ER visits in previous 12 months Vitals Screenings to include cognitive, depression, and falls Referrals and appointments  In addition, I have reviewed and discussed with patient certain preventive protocols, quality metrics, and best practice recommendations. A written personalized care plan for preventive services as well as general preventive health recommendations were provided to patient.     Daphane Shepherd, LPN   01/03/4714   Nurse Notes: Due TDAP Vaccine

## 2022-12-05 NOTE — Patient Instructions (Signed)
Lindsay Villarreal , Thank you for taking time to come for your Medicare Wellness Visit. I appreciate your ongoing commitment to your health goals. Please review the following plan we discussed and let me know if I can assist you in the future.   These are the goals we discussed:  Goals      DIET - EAT MORE FRUITS AND VEGETABLES     DIET - INCREASE WATER INTAKE     Try to drink 6-8 glasses of water daily. Cut back on the diet Dr Malachi Bonds     Exercise 150 min/wk Moderate Activity        This is a list of the screening recommended for you and due dates:  Health Maintenance  Topic Date Due   Screening for Lung Cancer  03/23/2018   COVID-19 Vaccine (3 - 2023-24 season) 07/18/2022   Eye exam for diabetics  09/06/2022   Yearly kidney health urinalysis for diabetes  04/16/2023   Lipid (cholesterol) test  04/16/2023   Hemoglobin A1C  04/16/2023   Complete foot exam   10/17/2023   Yearly kidney function blood test for diabetes  12/03/2023   Medicare Annual Wellness Visit  12/06/2023   DEXA scan (bone density measurement)  10/16/2024   Colon Cancer Screening  09/02/2026   Pneumonia Vaccine  Completed   Flu Shot  Completed   Hepatitis C Screening: USPSTF Recommendation to screen - Ages 18-79 yo.  Completed   Zoster (Shingles) Vaccine  Completed   HPV Vaccine  Aged Out   DTaP/Tdap/Td vaccine  Discontinued    Advanced directives: Please bring a copy of your health care power of attorney and living will to the office to be added to your chart at your convenience.   Conditions/risks identified: Aim for 30 minutes of exercise or brisk walking, 6-8 glasses of water, and 5 servings of fruits and vegetables each day.   Next appointment: Follow up in one year for your annual wellness visit    Preventive Care 65 Years and Older, Female Preventive care refers to lifestyle choices and visits with your health care provider that can promote health and wellness. What does preventive care include? A yearly  physical exam. This is also called an annual well check. Dental exams once or twice a year. Routine eye exams. Ask your health care provider how often you should have your eyes checked. Personal lifestyle choices, including: Daily care of your teeth and gums. Regular physical activity. Eating a healthy diet. Avoiding tobacco and drug use. Limiting alcohol use. Practicing safe sex. Taking low-dose aspirin every day. Taking vitamin and mineral supplements as recommended by your health care provider. What happens during an annual well check? The services and screenings done by your health care provider during your annual well check will depend on your age, overall health, lifestyle risk factors, and family history of disease. Counseling  Your health care provider may ask you questions about your: Alcohol use. Tobacco use. Drug use. Emotional well-being. Home and relationship well-being. Sexual activity. Eating habits. History of falls. Memory and ability to understand (cognition). Work and work Statistician. Reproductive health. Screening  You may have the following tests or measurements: Height, weight, and BMI. Blood pressure. Lipid and cholesterol levels. These may be checked every 5 years, or more frequently if you are over 33 years old. Skin check. Lung cancer screening. You may have this screening every year starting at age 58 if you have a 30-pack-year history of smoking and currently smoke or have  quit within the past 15 years. Fecal occult blood test (FOBT) of the stool. You may have this test every year starting at age 106. Flexible sigmoidoscopy or colonoscopy. You may have a sigmoidoscopy every 5 years or a colonoscopy every 10 years starting at age 34. Hepatitis C blood test. Hepatitis B blood test. Sexually transmitted disease (STD) testing. Diabetes screening. This is done by checking your blood sugar (glucose) after you have not eaten for a while (fasting). You may  have this done every 1-3 years. Bone density scan. This is done to screen for osteoporosis. You may have this done starting at age 33. Mammogram. This may be done every 1-2 years. Talk to your health care provider about how often you should have regular mammograms. Talk with your health care provider about your test results, treatment options, and if necessary, the need for more tests. Vaccines  Your health care provider may recommend certain vaccines, such as: Influenza vaccine. This is recommended every year. Tetanus, diphtheria, and acellular pertussis (Tdap, Td) vaccine. You may need a Td booster every 10 years. Zoster vaccine. You may need this after age 51. Pneumococcal 13-valent conjugate (PCV13) vaccine. One dose is recommended after age 37. Pneumococcal polysaccharide (PPSV23) vaccine. One dose is recommended after age 68. Talk to your health care provider about which screenings and vaccines you need and how often you need them. This information is not intended to replace advice given to you by your health care provider. Make sure you discuss any questions you have with your health care provider. Document Released: 11/30/2015 Document Revised: 07/23/2016 Document Reviewed: 09/04/2015 Elsevier Interactive Patient Education  2017 [place] Prevention in the Home Falls can cause injuries. They can happen to people of all ages. There are many things you can do to make your home safe and to help prevent falls. What can I do on the outside of my home? Regularly fix the edges of walkways and driveways and fix any cracks. Remove anything that might make you trip as you walk through a door, such as a raised step or threshold. Trim any bushes or trees on the path to your home. Use bright outdoor lighting. Clear any walking paths of anything that might make someone trip, such as rocks or tools. Regularly check to see if handrails are loose or broken. Make sure that both sides of any  steps have handrails. Any raised decks and porches should have guardrails on the edges. Have any leaves, snow, or ice cleared regularly. Use sand or salt on walking paths during winter. Clean up any spills in your garage right away. This includes oil or grease spills. What can I do in the bathroom? Use night lights. Install grab bars by the toilet and in the tub and shower. Do not use towel bars as grab bars. Use non-skid mats or decals in the tub or shower. If you need to sit down in the shower, use a plastic, non-slip stool. Keep the floor dry. Clean up any water that spills on the floor as soon as it happens. Remove soap buildup in the tub or shower regularly. Attach bath mats securely with double-sided non-slip rug tape. Do not have throw rugs and other things on the floor that can make you trip. What can I do in the bedroom? Use night lights. Make sure that you have a light by your bed that is easy to reach. Do not use any sheets or blankets that are too big for your bed.  They should not hang down onto the floor. Have a firm chair that has side arms. You can use this for support while you get dressed. Do not have throw rugs and other things on the floor that can make you trip. What can I do in the kitchen? Clean up any spills right away. Avoid walking on wet floors. Keep items that you use a lot in easy-to-reach places. If you need to reach something above you, use a strong step stool that has a grab bar. Keep electrical cords out of the way. Do not use floor polish or wax that makes floors slippery. If you must use wax, use non-skid floor wax. Do not have throw rugs and other things on the floor that can make you trip. What can I do with my stairs? Do not leave any items on the stairs. Make sure that there are handrails on both sides of the stairs and use them. Fix handrails that are broken or loose. Make sure that handrails are as long as the stairways. Check any carpeting to  make sure that it is firmly attached to the stairs. Fix any carpet that is loose or worn. Avoid having throw rugs at the top or bottom of the stairs. If you do have throw rugs, attach them to the floor with carpet tape. Make sure that you have a light switch at the top of the stairs and the bottom of the stairs. If you do not have them, ask someone to add them for you. What else can I do to help prevent falls? Wear shoes that: Do not have high heels. Have rubber bottoms. Are comfortable and fit you well. Are closed at the toe. Do not wear sandals. If you use a stepladder: Make sure that it is fully opened. Do not climb a closed stepladder. Make sure that both sides of the stepladder are locked into place. Ask someone to hold it for you, if possible. Clearly mark and make sure that you can see: Any grab bars or handrails. First and last steps. Where the edge of each step is. Use tools that help you move around (mobility aids) if they are needed. These include: Canes. Walkers. Scooters. Crutches. Turn on the lights when you go into a dark area. Replace any light bulbs as soon as they burn out. Set up your furniture so you have a clear path. Avoid moving your furniture around. If any of your floors are uneven, fix them. If there are any pets around you, be aware of where they are. Review your medicines with your doctor. Some medicines can make you feel dizzy. This can increase your chance of falling. Ask your doctor what other things that you can do to help prevent falls. This information is not intended to replace advice given to you by your health care provider. Make sure you discuss any questions you have with your health care provider. Document Released: 08/30/2009 Document Revised: 04/10/2016 Document Reviewed: 12/08/2014 Elsevier Interactive Patient Education  2017 [employer].

## 2022-12-08 ENCOUNTER — Ambulatory Visit: Payer: Medicare HMO | Admitting: Physical Therapy

## 2022-12-08 DIAGNOSIS — R600 Localized edema: Secondary | ICD-10-CM | POA: Diagnosis not present

## 2022-12-08 DIAGNOSIS — M25562 Pain in left knee: Secondary | ICD-10-CM | POA: Diagnosis not present

## 2022-12-08 DIAGNOSIS — M25662 Stiffness of left knee, not elsewhere classified: Secondary | ICD-10-CM

## 2022-12-08 NOTE — Therapy (Signed)
OUTPATIENT PHYSICAL THERAPY LOWER EXTREMITY EVALUATION   Patient Name: Lindsay Villarreal MRN: [id number] DOB:27-Nov-1945, 77 y.o., female Today's Date: 12/08/2022  END OF SESSION:  PT End of Session - 12/08/22 1019     Visit Number 2    Number of Visits 12    Date for PT Re-Evaluation 01/02/23    PT Start Time 0946    PT Stop Time 1033    PT Time Calculation (min) 47 min    Activity Tolerance Patient tolerated treatment well    Behavior During Therapy [facility] for tasks assessed/performed             Past Medical History:  Diagnosis Date   Anemia    Po iron   Arthritis    Bilateral cataracts    Essential hypertension    GERD (gastroesophageal reflux disease)    Heart murmur    History of kidney stones    Hyperlipidemia    Neuromuscular disorder ([place])    Osteoporosis    Type 2 diabetes mellitus ([place])    Past Surgical History:  Procedure Laterality Date   APPENDECTOMY  11/17/1976   BLADDER REPAIR     CATARACT EXTRACTION W/ INTRAOCULAR LENS IMPLANT  2010 (left), 2013 (right)   COLONOSCOPY  04/30/2015   Dr.Perry   COLONOSCOPY  2013   EYE SURGERY     POLYPECTOMY     TOTAL KNEE ARTHROPLASTY Left 12/01/2022   Procedure: TOTAL KNEE ARTHROPLASTY;  Surgeon: Gaynelle Arabian, MD;  Location: [facility];  Service: Orthopedics;  Laterality: Left;   TUBAL LIGATION     VAGINAL HYSTERECTOMY  11/17/1976   XI ROBOTIC ASSISTED INGUINAL HERNIA REPAIR WITH MESH Bilateral 02/22/2021   Procedure: XI ROBOTIC ASSISTED BILATERAL INGUINAL HERNIA REPAIR;  Surgeon: Clovis Riley, MD;  Location: [facility];  Service: General;  Laterality: Bilateral;  2.5 HOURS   Patient Active Problem List   Diagnosis Date Noted   OA (osteoarthritis) of knee 12/01/2022   Primary osteoarthritis of left knee 12/01/2022   Inguinal hernia of right side without obstruction or gangrene 07/12/2020   Heart murmur 07/12/2020   Inguinal hernia of left side without obstruction or gangrene 07/12/2020   Gastroesophageal reflux  disease without esophagitis 10/27/2016   Diabetic neuropathy associated with type 2 diabetes mellitus ([place]) 02/12/2015   Type 2 diabetes mellitus with complication, without long-term current use of insulin ([place]) 06/02/2013   Hypertension 06/02/2013   Anemia 12/16/2011   Osteoporosis 11/26/2011   Hyperlipidemia 11/26/2011    PCP: [name], FNP  REFERRING PROVIDER: Gaynelle Arabian, MD   REFERRING DIAG: S/P Left Total Knee Athroplasty   THERAPY DIAG:  Acute pain of left knee  Stiffness of left knee, not elsewhere classified  Localized edema  Rationale for Evaluation and Treatment: Rehabilitation  ONSET DATE: 12/01/22  SUBJECTIVE:   SUBJECTIVE STATEMENT: Pain at a 9/10.  Encouraged patient to be compliant to her HEP.  PERTINENT HISTORY: Hypertension, diabetes, osteoporosis, osteoarthritis, and current smoker PAIN:  Are you having pain? Yes: NPRS scale: 9/10 Pain location: left leg Pain description: sensitive, severe, stabbing Aggravating factors: moving her leg Relieving factors: ice, medication  PRECAUTIONS: None  WEIGHT BEARING RESTRICTIONS: No  FALLS:  Has patient fallen in last 6 months? No  LIVING ENVIRONMENT: Lives with: lives with their family Lives in: House/apartment Stairs: Yes: External: 5 steps; on right going up Has following equipment at home: Gilford Rile - 2 wheeled  OCCUPATION: retired  PLOF: Independent  PATIENT GOALS: reduced pain, play with her grandchild,  improved mobility, and walk without a walker  NEXT MD VISIT: 2 weeks from surgery  OBJECTIVE:  PATIENT SURVEYS:  FOTO 19.19  COGNITION: Overall cognitive status: Within functional limits for tasks assessed     SENSATION: Patient reports no numbness or tingling.   EDEMA:  Circumferential: L knee: 44 cm R: 37.5 cm   PALPATION: TTP: left quadriceps  LOWER EXTREMITY ROM:  Active ROM Right eval Left eval  Hip flexion    Hip extension    Hip abduction    Hip  adduction    Hip internal rotation    Hip external rotation    Knee flexion 120 41/47 (PROM)   Knee extension 0 9  Ankle dorsiflexion    Ankle plantarflexion    Ankle inversion    Ankle eversion     (Blank rows = not tested)  LOWER EXTREMITY MMT: not tested due to surgical condition  LOWER EXTREMITY SPECIAL TESTS:  Not tested due to surgical condition  GAIT: Assistive device utilized: Environmental consultant - 2 wheeled Level of assistance: Modified independence Comments: decreased gait speed, stride length, left knee flexed in stance, and decreased stance time on the left lower extremity   TODAY'S TREATMENT:                                                                                                                              DATE:                                     1/18 EXERCISE LOG  Exercise Repetitions and Resistance Comments  Nustep level 1 15 minutes moving seat forward x 1 to increase flexion                    In supine:  PROM x 8 minutes into flexion and extension f/b vasopneumatic on low with LE's elevated.  PATIENT EDUCATION:  Education details: Plan of care, healing, prognosis, anatomy, and goals for therapy Person educated: Patient Education method: Explanation Education comprehension: verbalized understanding  HOME EXERCISE PROGRAM: Patient was recommended to perform quad sets at home in addition to the home exercise program provided during acute care physical therapy.  ASSESSMENT:  CLINICAL IMPRESSION: The patient did well today and passive left knee extension is nearly equal to right.  Passive flexion to 75 degrees.    OBJECTIVE IMPAIRMENTS: Abnormal gait, decreased activity tolerance, decreased balance, decreased mobility, difficulty walking, decreased ROM, decreased strength, hypomobility, increased edema, impaired flexibility, impaired tone, and pain.   ACTIVITY LIMITATIONS: carrying, lifting, sitting, standing, squatting, sleeping, stairs, transfers, bed  mobility, bathing, dressing, locomotion level, and caring for others  PARTICIPATION LIMITATIONS: meal prep, cleaning, laundry, driving, shopping, community activity, and yard work  PERSONAL FACTORS: Transportation and 3+ comorbidities: Hypertension, diabetes, osteoporosis, osteoarthritis, and current smoker  are also affecting patient's functional outcome.   REHAB POTENTIAL: Good  CLINICAL  DECISION MAKING: Evolving/moderate complexity  EVALUATION COMPLEXITY: Moderate   GOALS: Goals reviewed with patient? Yes  LONG TERM GOALS: Target date: 01/01/23  Patient will be independent with her HEP. Baseline:  Goal status: INITIAL  2.  Patient will be able to safely ambulate at least 80 feet with a cane or the least restrictive assistive device for improved household mobility. Baseline:  Goal status: INITIAL  3.  Patient will be able to demonstrate active left knee extension within 5 degrees of neutral for improved gait mechanics.  Baseline:  Goal status: INITIAL  4.  Patient will be able to demonstrate at least 115 degrees of active left knee flexion for improved function navigating stairs. Baseline:  Goal status: INITIAL  PLAN:  PT FREQUENCY: 2-3x/week  PT DURATION: 4 weeks  PLANNED INTERVENTIONS: Therapeutic exercises, Therapeutic activity, Neuromuscular re-education, Balance training, Gait training, Patient/Family education, Self Care, Joint mobilization, Stair training, Electrical stimulation, Cryotherapy, Moist heat, Vasopneumatic device, Manual therapy, and Re-evaluation  PLAN FOR NEXT SESSION: NuStep, quad sets, heel slides, gastroc and hamstring stretching, manual therapy, and modalities as needed   [place], [place], PT 12/08/2022, 10:34 AM

## 2022-12-09 ENCOUNTER — Ambulatory Visit: Payer: Medicare HMO

## 2022-12-09 DIAGNOSIS — M25562 Pain in left knee: Secondary | ICD-10-CM

## 2022-12-09 DIAGNOSIS — M25662 Stiffness of left knee, not elsewhere classified: Secondary | ICD-10-CM

## 2022-12-09 DIAGNOSIS — R600 Localized edema: Secondary | ICD-10-CM | POA: Diagnosis not present

## 2022-12-09 NOTE — Therapy (Signed)
OUTPATIENT PHYSICAL THERAPY LOWER EXTREMITY TREATMENT   Patient Name: Lindsay Villarreal MRN: [id number] DOB:February 14, 1946, 77 y.o., female Today's Date: 12/09/2022  END OF SESSION:  PT End of Session - 12/09/22 0945     Visit Number 3    Number of Visits 12    Date for PT Re-Evaluation 01/02/23    PT Start Time 0945    PT Stop Time 2542    PT Time Calculation (min) 43 min    Activity Tolerance Patient limited by pain    Behavior During Therapy [facility] for tasks assessed/performed             Past Medical History:  Diagnosis Date   Anemia    Po iron   Arthritis    Bilateral cataracts    Essential hypertension    GERD (gastroesophageal reflux disease)    Heart murmur    History of kidney stones    Hyperlipidemia    Neuromuscular disorder ([place])    Osteoporosis    Type 2 diabetes mellitus ([place])    Past Surgical History:  Procedure Laterality Date   APPENDECTOMY  11/17/1976   BLADDER REPAIR     CATARACT EXTRACTION W/ INTRAOCULAR LENS IMPLANT  2010 (left), 2013 (right)   COLONOSCOPY  04/30/2015   Dr.Perry   COLONOSCOPY  2013   EYE SURGERY     POLYPECTOMY     TOTAL KNEE ARTHROPLASTY Left 12/01/2022   Procedure: TOTAL KNEE ARTHROPLASTY;  Surgeon: Gaynelle Arabian, MD;  Location: [facility];  Service: Orthopedics;  Laterality: Left;   TUBAL LIGATION     VAGINAL HYSTERECTOMY  11/17/1976   XI ROBOTIC ASSISTED INGUINAL HERNIA REPAIR WITH MESH Bilateral 02/22/2021   Procedure: XI ROBOTIC ASSISTED BILATERAL INGUINAL HERNIA REPAIR;  Surgeon: Clovis Riley, MD;  Location: [facility];  Service: General;  Laterality: Bilateral;  2.5 HOURS   Patient Active Problem List   Diagnosis Date Noted   OA (osteoarthritis) of knee 12/01/2022   Primary osteoarthritis of left knee 12/01/2022   Inguinal hernia of right side without obstruction or gangrene 07/12/2020   Heart murmur 07/12/2020   Inguinal hernia of left side without obstruction or gangrene 07/12/2020   Gastroesophageal reflux disease  without esophagitis 10/27/2016   Diabetic neuropathy associated with type 2 diabetes mellitus ([place]) 02/12/2015   Type 2 diabetes mellitus with complication, without long-term current use of insulin ([place]) 06/02/2013   Hypertension 06/02/2013   Anemia 12/16/2011   Osteoporosis 11/26/2011   Hyperlipidemia 11/26/2011    PCP: [name], FNP  REFERRING PROVIDER: Gaynelle Arabian, MD   REFERRING DIAG: S/P Left Total Knee Athroplasty   THERAPY DIAG:  Acute pain of left knee  Stiffness of left knee, not elsewhere classified  Localized edema  Rationale for Evaluation and Treatment: Rehabilitation  ONSET DATE: 12/01/22  SUBJECTIVE:   SUBJECTIVE STATEMENT: Patient reports that her knee is really sore today. She notes that her knee really hurts today. She notes that the pain is radiating up into her left hip.   PERTINENT HISTORY: Hypertension, diabetes, osteoporosis, osteoarthritis, and current smoker PAIN:  Are you having pain? Yes: NPRS scale: 9/10 Pain location: left leg Pain description: sensitive, severe, stabbing Aggravating factors: moving her leg Relieving factors: ice, medication  PRECAUTIONS: None  WEIGHT BEARING RESTRICTIONS: No  FALLS:  Has patient fallen in last 6 months? No  LIVING ENVIRONMENT: Lives with: lives with their family Lives in: House/apartment Stairs: Yes: External: 5 steps; on right going up Has following equipment at home: Gilford Rile -  2 wheeled  OCCUPATION: retired  PLOF: Independent  PATIENT GOALS: reduced pain, play with her grandchild, improved mobility, and walk without a walker  NEXT MD VISIT: 2 weeks from surgery  OBJECTIVE:  PATIENT SURVEYS:  FOTO 19.19  COGNITION: Overall cognitive status: Within functional limits for tasks assessed     SENSATION: Patient reports no numbness or tingling.   EDEMA:  Circumferential: L knee: 44 cm R: 37.5 cm   PALPATION: TTP: left quadriceps  LOWER EXTREMITY ROM:  Active ROM  Right eval Left eval  Hip flexion    Hip extension    Hip abduction    Hip adduction    Hip internal rotation    Hip external rotation    Knee flexion 120 41/47 (PROM)   Knee extension 0 9  Ankle dorsiflexion    Ankle plantarflexion    Ankle inversion    Ankle eversion     (Blank rows = not tested)  LOWER EXTREMITY MMT: not tested due to surgical condition  LOWER EXTREMITY SPECIAL TESTS:  Not tested due to surgical condition  GAIT: Assistive device utilized: Environmental consultant - 2 wheeled Level of assistance: Modified independence Comments: decreased gait speed, stride length, left knee flexed in stance, and decreased stance time on the left lower extremity   TODAY'S TREATMENT:                                                                                                                              DATE:                                     1/23 EXERCISE LOG  Exercise Repetitions and Resistance Comments  Nustep  L1 x 15 minutes; seat 9   Quad sets  2.5 minutes w/ 5 second hold    Heel slides  2 minutes 54 degrees maximum knee flexion   Gastroc stretch 4 x 30 seconds   Supine HS stretch  3 x 30 seconds With therapist assistance   Blank cell = exercise not performed today  Modalities  Date:  Vaso: Knee, 34 degrees; low pressure , 10 mins, Pain and Edema                                   1/18 EXERCISE LOG  Exercise Repetitions and Resistance Comments  Nustep level 1 15 minutes moving seat forward x 1 to increase flexion                    In supine:  PROM x 8 minutes into flexion and extension f/b vasopneumatic on low with LE's elevated.  PATIENT EDUCATION:  Education details: Plan of care, healing, prognosis, anatomy, and goals for therapy Person educated: Patient Education method: Explanation Education comprehension: verbalized understanding  HOME EXERCISE PROGRAM: Patient was  recommended to perform quad sets at home in addition to the home exercise program provided  during acute care physical therapy.  ASSESSMENT:  CLINICAL IMPRESSION: Patient presented to treatment reporting high pain severity and knee "stiffness" after her appointment yesterday. She was introduced to quad sets and other new interventions for improved left knee mobility. She was significantly limited by her familiar pain with today's interventions. However, she reported feeling better upon the conclusion of treatment. She continues to require skilled physical therapy to address her remaining impairments to return to her prior level of function.   OBJECTIVE IMPAIRMENTS: Abnormal gait, decreased activity tolerance, decreased balance, decreased mobility, difficulty walking, decreased ROM, decreased strength, hypomobility, increased edema, impaired flexibility, impaired tone, and pain.   ACTIVITY LIMITATIONS: carrying, lifting, sitting, standing, squatting, sleeping, stairs, transfers, bed mobility, bathing, dressing, locomotion level, and caring for others  PARTICIPATION LIMITATIONS: meal prep, cleaning, laundry, driving, shopping, community activity, and yard work  PERSONAL FACTORS: Transportation and 3+ comorbidities: Hypertension, diabetes, osteoporosis, osteoarthritis, and current smoker  are also affecting patient's functional outcome.   REHAB POTENTIAL: Good  CLINICAL DECISION MAKING: Evolving/moderate complexity  EVALUATION COMPLEXITY: Moderate   GOALS: Goals reviewed with patient? Yes  LONG TERM GOALS: Target date: 01/01/23  Patient will be independent with her HEP. Baseline:  Goal status: INITIAL  2.  Patient will be able to safely ambulate at least 80 feet with a cane or the least restrictive assistive device for improved household mobility. Baseline:  Goal status: INITIAL  3.  Patient will be able to demonstrate active left knee extension within 5 degrees of neutral for improved gait mechanics.  Baseline:  Goal status: INITIAL  4.  Patient will be able to  demonstrate at least 115 degrees of active left knee flexion for improved function navigating stairs. Baseline:  Goal status: INITIAL  PLAN:  PT FREQUENCY: 2-3x/week  PT DURATION: 4 weeks  PLANNED INTERVENTIONS: Therapeutic exercises, Therapeutic activity, Neuromuscular re-education, Balance training, Gait training, Patient/Family education, Self Care, Joint mobilization, Stair training, Electrical stimulation, Cryotherapy, Moist heat, Vasopneumatic device, Manual therapy, and Re-evaluation  PLAN FOR NEXT SESSION: NuStep, quad sets, heel slides, gastroc and hamstring stretching, manual therapy, and modalities as needed   Darlin Coco, PT 12/09/2022, 12:30 PM

## 2022-12-09 NOTE — Therapy (Deleted)
OUTPATIENT PHYSICAL THERAPY LOWER EXTREMITY EVALUATION   Patient Name: Lindsay Villarreal MRN: [id number] DOB:[birth year], 77 y.o., female Today's Date: 12/08/2022  END OF SESSION:  PT End of Session - 12/08/22 1019     Visit Number 2    Number of Visits 12    Date for PT Re-Evaluation 01/02/23    PT Start Time 0946    PT Stop Time 1033    PT Time Calculation (min) 47 min    Activity Tolerance Patient tolerated treatment well    Behavior During Therapy [facility] for tasks assessed/performed             Past Medical History:  Diagnosis Date   Anemia    Po iron   Arthritis    Bilateral cataracts    Essential hypertension    GERD (gastroesophageal reflux disease)    Heart murmur    History of kidney stones    Hyperlipidemia    Neuromuscular disorder ([place])    Osteoporosis    Type 2 diabetes mellitus ([place])    Past Surgical History:  Procedure Laterality Date   APPENDECTOMY  11/17/1976   BLADDER REPAIR     CATARACT EXTRACTION W/ INTRAOCULAR LENS IMPLANT  2010 (left), 2013 (right)   COLONOSCOPY  04/30/2015   Dr.Perry   COLONOSCOPY  2013   EYE SURGERY     POLYPECTOMY     TOTAL KNEE ARTHROPLASTY Left 12/01/2022   Procedure: TOTAL KNEE ARTHROPLASTY;  Surgeon: Gaynelle Arabian, MD;  [facility];  Service: Orthopedics;  Laterality: Left;   TUBAL LIGATION     VAGINAL HYSTERECTOMY  11/17/1976   XI ROBOTIC ASSISTED INGUINAL HERNIA REPAIR WITH MESH Bilateral 02/22/2021   Procedure: XI ROBOTIC ASSISTED BILATERAL INGUINAL HERNIA REPAIR;  Surgeon: Clovis Riley, MD;  [facility];  Service: General;  Laterality: Bilateral;  2.5 HOURS   Patient Active Problem List   Diagnosis Date Noted   OA (osteoarthritis) of knee 12/01/2022   Primary osteoarthritis of left knee 12/01/2022   Inguinal hernia of right side without obstruction or gangrene 07/12/2020   Heart murmur 07/12/2020   Inguinal hernia of left side without obstruction or gangrene 07/12/2020   Gastroesophageal reflux  disease without esophagitis 10/27/2016   Diabetic neuropathy associated with type 2 diabetes mellitus ([place]) 02/12/2015   Type 2 diabetes mellitus with complication, without long-term current use of insulin ([place]) 06/02/2013   Hypertension 06/02/2013   Anemia 12/16/2011   Osteoporosis 11/26/2011   Hyperlipidemia 11/26/2011    PCP: [name], FNP  REFERRING PROVIDER: Gaynelle Arabian, MD   REFERRING DIAG: S/P Left Total Knee Athroplasty   THERAPY DIAG:  Acute pain of left knee  Stiffness of left knee, not elsewhere classified  Localized edema  Rationale for Evaluation and Treatment: Rehabilitation  ONSET DATE: 12/01/22  SUBJECTIVE:   SUBJECTIVE STATEMENT: ***  PERTINENT HISTORY: Hypertension, diabetes, osteoporosis, osteoarthritis, and current smoker PAIN:  Are you having pain? Yes: NPRS scale: 9/10 Pain location: left leg Pain description: sensitive, severe, stabbing Aggravating factors: moving her leg Relieving factors: ice, medication  PRECAUTIONS: None  WEIGHT BEARING RESTRICTIONS: No  FALLS:  Has patient fallen in last 6 months? No  LIVING ENVIRONMENT: Lives with: lives with their family Lives in: House/apartment Stairs: Yes: External: 5 steps; on right going up Has following equipment at home: Gilford Rile - 2 wheeled  OCCUPATION: retired  PLOF: Independent  PATIENT GOALS: reduced pain, play with her grandchild, improved mobility, and walk without a walker  NEXT MD VISIT: 2  weeks from surgery  OBJECTIVE:  PATIENT SURVEYS:  FOTO 19.19  COGNITION: Overall cognitive status: Within functional limits for tasks assessed     SENSATION: Patient reports no numbness or tingling.   EDEMA:  Circumferential: L knee: 44 cm R: 37.5 cm   PALPATION: TTP: left quadriceps  LOWER EXTREMITY ROM:  Active ROM Right eval Left eval  Hip flexion    Hip extension    Hip abduction    Hip adduction    Hip internal rotation    Hip external rotation     Knee flexion 120 41/47 (PROM)   Knee extension 0 9  Ankle dorsiflexion    Ankle plantarflexion    Ankle inversion    Ankle eversion     (Blank rows = not tested)  LOWER EXTREMITY MMT: not tested due to surgical condition  LOWER EXTREMITY SPECIAL TESTS:  Not tested due to surgical condition  GAIT: Assistive device utilized: Environmental consultant - 2 wheeled Level of assistance: Modified independence Comments: decreased gait speed, stride length, left knee flexed in stance, and decreased stance time on the left lower extremity   TODAY'S TREATMENT:                                                                                                                              DATE:                                     1/23 EXERCISE LOG  Exercise Repetitions and Resistance Comments                       Blank cell = exercise not performed today                                    1/18 EXERCISE LOG  Exercise Repetitions and Resistance Comments  Nustep level 1 15 minutes moving seat forward x 1 to increase flexion                    In supine:  PROM x 8 minutes into flexion and extension f/b vasopneumatic on low with LE's elevated.  PATIENT EDUCATION:  Education details: Plan of care, healing, prognosis, anatomy, and goals for therapy Person educated: Patient Education method: Explanation Education comprehension: verbalized understanding  HOME EXERCISE PROGRAM: Patient was recommended to perform quad sets at home in addition to the home exercise program provided during acute care physical therapy.  ASSESSMENT:  CLINICAL IMPRESSION: ***  OBJECTIVE IMPAIRMENTS: Abnormal gait, decreased activity tolerance, decreased balance, decreased mobility, difficulty walking, decreased ROM, decreased strength, hypomobility, increased edema, impaired flexibility, impaired tone, and pain.   ACTIVITY LIMITATIONS: carrying, lifting, sitting, standing, squatting, sleeping, stairs, transfers, bed  mobility, bathing, dressing, locomotion level, and caring for others  PARTICIPATION LIMITATIONS:  meal prep, cleaning, laundry, driving, shopping, community activity, and yard work  PERSONAL FACTORS: Transportation and 3+ comorbidities: Hypertension, diabetes, osteoporosis, osteoarthritis, and current smoker  are also affecting patient's functional outcome.   REHAB POTENTIAL: Good  CLINICAL DECISION MAKING: Evolving/moderate complexity  EVALUATION COMPLEXITY: Moderate   GOALS: Goals reviewed with patient? Yes  LONG TERM GOALS: Target date: 01/01/23  Patient will be independent with her HEP. Baseline:  Goal status: INITIAL  2.  Patient will be able to safely ambulate at least 80 feet with a cane or the least restrictive assistive device for improved household mobility. Baseline:  Goal status: INITIAL  3.  Patient will be able to demonstrate active left knee extension within 5 degrees of neutral for improved gait mechanics.  Baseline:  Goal status: INITIAL  4.  Patient will be able to demonstrate at least 115 degrees of active left knee flexion for improved function navigating stairs. Baseline:  Goal status: INITIAL  PLAN:  PT FREQUENCY: 2-3x/week  PT DURATION: 4 weeks  PLANNED INTERVENTIONS: Therapeutic exercises, Therapeutic activity, Neuromuscular re-education, Balance training, Gait training, Patient/Family education, Self Care, Joint mobilization, Stair training, Electrical stimulation, Cryotherapy, Moist heat, Vasopneumatic device, Manual therapy, and Re-evaluation  PLAN FOR NEXT SESSION: NuStep, quad sets, heel slides, gastroc and hamstring stretching, manual therapy, and modalities as needed   [place], [place], PT 12/08/2022, 10:34 AM

## 2022-12-12 ENCOUNTER — Encounter: Payer: Self-pay | Admitting: Physical Therapy

## 2022-12-12 ENCOUNTER — Ambulatory Visit: Payer: Medicare HMO | Admitting: Physical Therapy

## 2022-12-12 DIAGNOSIS — M25662 Stiffness of left knee, not elsewhere classified: Secondary | ICD-10-CM | POA: Diagnosis not present

## 2022-12-12 DIAGNOSIS — M25562 Pain in left knee: Secondary | ICD-10-CM | POA: Diagnosis not present

## 2022-12-12 DIAGNOSIS — R600 Localized edema: Secondary | ICD-10-CM | POA: Diagnosis not present

## 2022-12-12 NOTE — Therapy (Signed)
OUTPATIENT PHYSICAL THERAPY LOWER EXTREMITY TREATMENT   Patient Name: Lindsay Villarreal MRN: [id number] DOB:1946-04-17, 77 y.o., female Today's Date: 12/12/2022  END OF SESSION:  PT End of Session - 12/12/22 1254     Visit Number 4    Number of Visits 12    Date for PT Re-Evaluation 01/02/23    PT Start Time 0946    PT Stop Time 1036    PT Time Calculation (min) 50 min    Activity Tolerance Patient limited by pain    Behavior During Therapy [facility] for tasks assessed/performed             Past Medical History:  Diagnosis Date   Anemia    Po iron   Arthritis    Bilateral cataracts    Essential hypertension    GERD (gastroesophageal reflux disease)    Heart murmur    History of kidney stones    Hyperlipidemia    Neuromuscular disorder ([place])    Osteoporosis    Type 2 diabetes mellitus ([place])    Past Surgical History:  Procedure Laterality Date   APPENDECTOMY  11/17/1976   BLADDER REPAIR     CATARACT EXTRACTION W/ INTRAOCULAR LENS IMPLANT  2010 (left), 2013 (right)   COLONOSCOPY  04/30/2015   Dr.Perry   COLONOSCOPY  2013   EYE SURGERY     POLYPECTOMY     TOTAL KNEE ARTHROPLASTY Left 12/01/2022   Procedure: TOTAL KNEE ARTHROPLASTY;  Surgeon: Gaynelle Arabian, MD;  [facility];  Service: Orthopedics;  Laterality: Left;   TUBAL LIGATION     VAGINAL HYSTERECTOMY  11/17/1976   XI ROBOTIC ASSISTED INGUINAL HERNIA REPAIR WITH MESH Bilateral 02/22/2021   Procedure: XI ROBOTIC ASSISTED BILATERAL INGUINAL HERNIA REPAIR;  Surgeon: Clovis Riley, MD;  [facility];  Service: General;  Laterality: Bilateral;  2.5 HOURS   Patient Active Problem List   Diagnosis Date Noted   OA (osteoarthritis) of knee 12/01/2022   Primary osteoarthritis of left knee 12/01/2022   Inguinal hernia of right side without obstruction or gangrene 07/12/2020   Heart murmur 07/12/2020   Inguinal hernia of left side without obstruction or gangrene 07/12/2020   Gastroesophageal reflux disease  without esophagitis 10/27/2016   Diabetic neuropathy associated with type 2 diabetes mellitus ([place]) 02/12/2015   Type 2 diabetes mellitus with complication, without long-term current use of insulin ([place]) 06/02/2013   Hypertension 06/02/2013   Anemia 12/16/2011   Osteoporosis 11/26/2011   Hyperlipidemia 11/26/2011    PCP: [name], FNP  REFERRING PROVIDER: Gaynelle Arabian, MD   REFERRING DIAG: S/P Left Total Knee Athroplasty   THERAPY DIAG:  Acute pain of left knee  Stiffness of left knee, not elsewhere classified  Localized edema  Rationale for Evaluation and Treatment: Rehabilitation  ONSET DATE: 12/01/22  SUBJECTIVE:   SUBJECTIVE STATEMENT: Patient reports that her knee is really sore today. She notes that her knee really hurts today. She notes that the pain is radiating up into her left hip.   PERTINENT HISTORY: Hypertension, diabetes, osteoporosis, osteoarthritis, and current smoker PAIN:  Are you having pain? Yes: NPRS scale: 9/10 Pain location: left leg Pain description: sensitive, severe, stabbing Aggravating factors: moving her leg Relieving factors: ice, medication  PRECAUTIONS: None  WEIGHT BEARING RESTRICTIONS: No  FALLS:  Has patient fallen in last 6 months? No  LIVING ENVIRONMENT: Lives with: lives with their family Lives in: House/apartment Stairs: Yes: External: 5 steps; on right going up Has following equipment at home: Gilford Rile -  2 wheeled  OCCUPATION: retired  PLOF: Independent  PATIENT GOALS: reduced pain, play with her grandchild, improved mobility, and walk without a walker  NEXT MD VISIT: 2 weeks from surgery  OBJECTIVE:  PATIENT SURVEYS:  FOTO 19.19  COGNITION: Overall cognitive status: Within functional limits for tasks assessed     SENSATION: Patient reports no numbness or tingling.   EDEMA:  Circumferential: L knee: 44 cm R: 37.5 cm   PALPATION: TTP: left quadriceps  LOWER EXTREMITY ROM:  Active ROM  Right eval Left eval  Hip flexion    Hip extension    Hip abduction    Hip adduction    Hip internal rotation    Hip external rotation    Knee flexion 120 41/47 (PROM)   Knee extension 0 9  Ankle dorsiflexion    Ankle plantarflexion    Ankle inversion    Ankle eversion     (Blank rows = not tested)  LOWER EXTREMITY MMT: not tested due to surgical condition  LOWER EXTREMITY SPECIAL TESTS:  Not tested due to surgical condition  GAIT: Assistive device utilized: Environmental consultant - 2 wheeled Level of assistance: Modified independence Comments: decreased gait speed, stride length, left knee flexed in stance, and decreased stance time on the left lower extremity   TODAY'S TREATMENT:                                                                                                                              DATE:                     12/12/22:  Nustep x 16 minutes moving seat forward x 1 to increase flexion f/b non-resisted SAQ's x 3 minutes with end-range extension assist f/b PROM into left knee flexion and extension x 7 minutes with low load long duration stretching technique utilized f/b LE elevation and vasopneumatic on medium x 15 minutes.  PATIENT EDUCATION:  Education details: Plan of care, healing, prognosis, anatomy, and goals for therapy Person educated: Patient Education method: Explanation Education comprehension: verbalized understanding  HOME EXERCISE PROGRAM: Patient was recommended to perform quad sets at home in addition to the home exercise program provided during acute care physical therapy.  ASSESSMENT:  CLINICAL IMPRESSION: Patient continues to reports high pain-level.  PROM performed to patient tolerance and achieved flexion to 80 degrees. Continued to encourage her to remain compliant to her HEP.  OBJECTIVE IMPAIRMENTS: Abnormal gait, decreased activity tolerance, decreased balance, decreased mobility, difficulty walking, decreased ROM, decreased strength,  hypomobility, increased edema, impaired flexibility, impaired tone, and pain.   ACTIVITY LIMITATIONS: carrying, lifting, sitting, standing, squatting, sleeping, stairs, transfers, bed mobility, bathing, dressing, locomotion level, and caring for others  PARTICIPATION LIMITATIONS: meal prep, cleaning, laundry, driving, shopping, community activity, and yard work  PERSONAL FACTORS: Transportation and 3+ comorbidities: Hypertension, diabetes, osteoporosis, osteoarthritis, and current smoker  are also affecting patient's functional outcome.   REHAB POTENTIAL: Good  CLINICAL DECISION  MAKING: Evolving/moderate complexity  EVALUATION COMPLEXITY: Moderate   GOALS: Goals reviewed with patient? Yes  LONG TERM GOALS: Target date: 01/01/23  Patient will be independent with her HEP. Baseline:  Goal status: INITIAL  2.  Patient will be able to safely ambulate at least 80 feet with a cane or the least restrictive assistive device for improved household mobility. Baseline:  Goal status: INITIAL  3.  Patient will be able to demonstrate active left knee extension within 5 degrees of neutral for improved gait mechanics.  Baseline:  Goal status: INITIAL  4.  Patient will be able to demonstrate at least 115 degrees of active left knee flexion for improved function navigating stairs. Baseline:  Goal status: INITIAL  PLAN:  PT FREQUENCY: 2-3x/week  PT DURATION: 4 weeks  PLANNED INTERVENTIONS: Therapeutic exercises, Therapeutic activity, Neuromuscular re-education, Balance training, Gait training, Patient/Family education, Self Care, Joint mobilization, Stair training, Electrical stimulation, Cryotherapy, Moist heat, Vasopneumatic device, Manual therapy, and Re-evaluation  PLAN FOR NEXT SESSION: NuStep, quad sets, heel slides, gastroc and hamstring stretching, manual therapy, and modalities as needed   Verda Mehta, [place], PT 12/12/2022, 1:14 PM

## 2022-12-15 ENCOUNTER — Ambulatory Visit: Payer: Medicare HMO | Admitting: Physical Therapy

## 2022-12-15 ENCOUNTER — Encounter: Payer: Self-pay | Admitting: Physical Therapy

## 2022-12-15 DIAGNOSIS — R600 Localized edema: Secondary | ICD-10-CM | POA: Diagnosis not present

## 2022-12-15 DIAGNOSIS — M25562 Pain in left knee: Secondary | ICD-10-CM | POA: Diagnosis not present

## 2022-12-15 DIAGNOSIS — M25662 Stiffness of left knee, not elsewhere classified: Secondary | ICD-10-CM

## 2022-12-15 NOTE — Therapy (Signed)
OUTPATIENT PHYSICAL THERAPY LOWER EXTREMITY TREATMENT   Patient Name: Lindsay Villarreal MRN: [id number] DOB:February 05, 1946, 77 y.o., female Today's Date: 12/15/2022  END OF SESSION:  PT End of Session - 12/15/22 1445     Visit Number 5    Number of Visits 12    Date for PT Re-Evaluation 01/02/23    PT Start Time 0230    PT Stop Time 0320    PT Time Calculation (min) 50 min    Activity Tolerance Patient limited by pain    Behavior During Therapy [facility] for tasks assessed/performed              Past Medical History:  Diagnosis Date   Anemia    Po iron   Arthritis    Bilateral cataracts    Essential hypertension    GERD (gastroesophageal reflux disease)    Heart murmur    History of kidney stones    Hyperlipidemia    Neuromuscular disorder ([place])    Osteoporosis    Type 2 diabetes mellitus ([place])    Past Surgical History:  Procedure Laterality Date   APPENDECTOMY  11/17/1976   BLADDER REPAIR     CATARACT EXTRACTION W/ INTRAOCULAR LENS IMPLANT  2010 (left), 2013 (right)   COLONOSCOPY  04/30/2015   Dr.Perry   COLONOSCOPY  2013   EYE SURGERY     POLYPECTOMY     TOTAL KNEE ARTHROPLASTY Left 12/01/2022   Procedure: TOTAL KNEE ARTHROPLASTY;  Surgeon: Gaynelle Arabian, MD;  Location: [facility];  Service: Orthopedics;  Laterality: Left;   TUBAL LIGATION     VAGINAL HYSTERECTOMY  11/17/1976   XI ROBOTIC ASSISTED INGUINAL HERNIA REPAIR WITH MESH Bilateral 02/22/2021   Procedure: XI ROBOTIC ASSISTED BILATERAL INGUINAL HERNIA REPAIR;  Surgeon: Clovis Riley, MD;  Location: [facility];  Service: General;  Laterality: Bilateral;  2.5 HOURS   Patient Active Problem List   Diagnosis Date Noted   OA (osteoarthritis) of knee 12/01/2022   Primary osteoarthritis of left knee 12/01/2022   Inguinal hernia of right side without obstruction or gangrene 07/12/2020   Heart murmur 07/12/2020   Inguinal hernia of left side without obstruction or gangrene 07/12/2020   Gastroesophageal reflux disease  without esophagitis 10/27/2016   Diabetic neuropathy associated with type 2 diabetes mellitus ([place]) 02/12/2015   Type 2 diabetes mellitus with complication, without long-term current use of insulin ([place]) 06/02/2013   Hypertension 06/02/2013   Anemia 12/16/2011   Osteoporosis 11/26/2011   Hyperlipidemia 11/26/2011    PCP: [name], FNP  REFERRING PROVIDER: Gaynelle Arabian, MD   REFERRING DIAG: S/P Left Total Knee Athroplasty   THERAPY DIAG:  Acute pain of left knee  Stiffness of left knee, not elsewhere classified  Localized edema  Rationale for Evaluation and Treatment: Rehabilitation  ONSET DATE: 12/01/22  SUBJECTIVE:   SUBJECTIVE STATEMENT: Patient reports that her knee is really sore today. She notes that her knee really hurts today. She notes that the pain is radiating up into her left hip.   PERTINENT HISTORY: Hypertension, diabetes, osteoporosis, osteoarthritis, and current smoker PAIN:  Are you having pain? Yes: NPRS scale: 9/10 Pain location: left leg Pain description: sensitive, severe, stabbing Aggravating factors: moving her leg Relieving factors: ice, medication  PRECAUTIONS: None  WEIGHT BEARING RESTRICTIONS: No  FALLS:  Has patient fallen in last 6 months? No  LIVING ENVIRONMENT: Lives with: lives with their family Lives in: House/apartment Stairs: Yes: External: 5 steps; on right going up Has following equipment at home: Gilford Rile -  2 wheeled  OCCUPATION: retired  PLOF: Independent  PATIENT GOALS: reduced pain, play with her grandchild, improved mobility, and walk without a walker  NEXT MD VISIT: 2 weeks from surgery  OBJECTIVE:  PATIENT SURVEYS:  FOTO 19.19  COGNITION: Overall cognitive status: Within functional limits for tasks assessed     SENSATION: Patient reports no numbness or tingling.   EDEMA:  Circumferential: L knee: 44 cm R: 37.5 cm   PALPATION: TTP: left quadriceps  LOWER EXTREMITY ROM:  Active ROM  Right eval Left eval  Hip flexion    Hip extension    Hip abduction    Hip adduction    Hip internal rotation    Hip external rotation    Knee flexion 120 41/47 (PROM)   Knee extension 0 9  Ankle dorsiflexion    Ankle plantarflexion    Ankle inversion    Ankle eversion     (Blank rows = not tested)  LOWER EXTREMITY MMT: not tested due to surgical condition  LOWER EXTREMITY SPECIAL TESTS:  Not tested due to surgical condition  GAIT: Assistive device utilized: Environmental consultant - 2 wheeled Level of assistance: Modified independence Comments: decreased gait speed, stride length, left knee flexed in stance, and decreased stance time on the left lower extremity   TODAY'S TREATMENT:                                                                                                                              DATE:                                     EXERCISE LOG 12/15/22:  Exercise Repetitions and Resistance Comments  Nustep (Level 2)  Seat 9 to 8 x 15 minutes   Rockerboard in parallel bars 3 minutes   8 inch box lunges in parallel bars 2 minutes   SAQ's (non-resisted) 3 minutes.       In supine:  PROM to patient's left knee x 4 minutes.      Vasopneumatic with LE elevation on medium x 13 minutes.              PATIENT EDUCATION:  Education details: Plan of care, healing, prognosis, anatomy, and goals for therapy Person educated: Patient Education method: Explanation Education comprehension: verbalized understanding  HOME EXERCISE PROGRAM: Patient did very well with new interventions today including Rockerboard and 8 inch box lunges.  SAQ performed better than last visit. ASSESSMENT:  CLINICAL IMPRESSION: Patient continues to reports high pain-level.  PROM performed to patient tolerance and achieved flexion to 80 degrees. Continued to encourage her to remain compliant to her HEP.  OBJECTIVE IMPAIRMENTS: Abnormal gait, decreased activity tolerance, decreased balance, decreased  mobility, difficulty walking, decreased ROM, decreased strength, hypomobility, increased edema, impaired flexibility, impaired tone, and pain.   ACTIVITY LIMITATIONS: carrying, lifting, sitting, standing, squatting, sleeping, stairs, transfers, bed mobility,  bathing, dressing, locomotion level, and caring for others  PARTICIPATION LIMITATIONS: meal prep, cleaning, laundry, driving, shopping, community activity, and yard work  PERSONAL FACTORS: Transportation and 3+ comorbidities: Hypertension, diabetes, osteoporosis, osteoarthritis, and current smoker  are also affecting patient's functional outcome.   REHAB POTENTIAL: Good  CLINICAL DECISION MAKING: Evolving/moderate complexity  EVALUATION COMPLEXITY: Moderate   GOALS: Goals reviewed with patient? Yes  LONG TERM GOALS: Target date: 01/01/23  Patient will be independent with her HEP. Baseline:  Goal status: INITIAL  2.  Patient will be able to safely ambulate at least 80 feet with a cane or the least restrictive assistive device for improved household mobility. Baseline:  Goal status: INITIAL  3.  Patient will be able to demonstrate active left knee extension within 5 degrees of neutral for improved gait mechanics.  Baseline:  Goal status: INITIAL  4.  Patient will be able to demonstrate at least 115 degrees of active left knee flexion for improved function navigating stairs. Baseline:  Goal status: INITIAL  PLAN:  PT FREQUENCY: 2-3x/week  PT DURATION: 4 weeks  PLANNED INTERVENTIONS: Therapeutic exercises, Therapeutic activity, Neuromuscular re-education, Balance training, Gait training, Patient/Family education, Self Care, Joint mobilization, Stair training, Electrical stimulation, Cryotherapy, Moist heat, Vasopneumatic device, Manual therapy, and Re-evaluation  PLAN FOR NEXT SESSION: NuStep, quad sets, heel slides, gastroc and hamstring stretching, manual therapy, and modalities as needed   Nela Bascom, [place],  PT 12/15/2022, 3:21 PM

## 2022-12-17 ENCOUNTER — Ambulatory Visit: Payer: Medicare HMO | Admitting: Physical Therapy

## 2022-12-17 ENCOUNTER — Telehealth: Payer: Self-pay | Admitting: Nurse Practitioner

## 2022-12-17 DIAGNOSIS — M25662 Stiffness of left knee, not elsewhere classified: Secondary | ICD-10-CM | POA: Diagnosis not present

## 2022-12-17 DIAGNOSIS — M25562 Pain in left knee: Secondary | ICD-10-CM

## 2022-12-17 DIAGNOSIS — R600 Localized edema: Secondary | ICD-10-CM | POA: Diagnosis not present

## 2022-12-17 NOTE — Therapy (Signed)
OUTPATIENT PHYSICAL THERAPY LOWER EXTREMITY TREATMENT   Patient Name: Lindsay Villarreal MRN: [id number] DOB:10-11-1946, 77 y.o., female Today's Date: 12/17/2022  END OF SESSION:  PT End of Session - 12/17/22 1121     Visit Number 6    Number of Visits 12    Date for PT Re-Evaluation 01/02/23    PT Start Time 1115              Past Medical History:  Diagnosis Date   Anemia    Po iron   Arthritis    Bilateral cataracts    Essential hypertension    GERD (gastroesophageal reflux disease)    Heart murmur    History of kidney stones    Hyperlipidemia    Neuromuscular disorder ([place])    Osteoporosis    Type 2 diabetes mellitus ([place])    Past Surgical History:  Procedure Laterality Date   APPENDECTOMY  11/17/1976   BLADDER REPAIR     CATARACT EXTRACTION W/ INTRAOCULAR LENS IMPLANT  2010 (left), 2013 (right)   COLONOSCOPY  04/30/2015   Dr.Perry   COLONOSCOPY  2013   EYE SURGERY     POLYPECTOMY     TOTAL KNEE ARTHROPLASTY Left 12/01/2022   Procedure: TOTAL KNEE ARTHROPLASTY;  Surgeon: Gaynelle Arabian, MD;  Location: [facility];  Service: Orthopedics;  Laterality: Left;   TUBAL LIGATION     VAGINAL HYSTERECTOMY  11/17/1976   XI ROBOTIC ASSISTED INGUINAL HERNIA REPAIR WITH MESH Bilateral 02/22/2021   Procedure: XI ROBOTIC ASSISTED BILATERAL INGUINAL HERNIA REPAIR;  Surgeon: Clovis Riley, MD;  Location: [facility];  Service: General;  Laterality: Bilateral;  2.5 HOURS   Patient Active Problem List   Diagnosis Date Noted   OA (osteoarthritis) of knee 12/01/2022   Primary osteoarthritis of left knee 12/01/2022   Inguinal hernia of right side without obstruction or gangrene 07/12/2020   Heart murmur 07/12/2020   Inguinal hernia of left side without obstruction or gangrene 07/12/2020   Gastroesophageal reflux disease without esophagitis 10/27/2016   Diabetic neuropathy associated with type 2 diabetes mellitus ([place]) 02/12/2015   Type 2 diabetes mellitus with complication, without  long-term current use of insulin ([place]) 06/02/2013   Hypertension 06/02/2013   Anemia 12/16/2011   Osteoporosis 11/26/2011   Hyperlipidemia 11/26/2011    PCP: [name], FNP  REFERRING PROVIDER: Gaynelle Arabian, MD   REFERRING DIAG: S/P Left Total Knee Athroplasty   THERAPY DIAG:  No diagnosis found.  Rationale for Evaluation and Treatment: Rehabilitation  ONSET DATE: 12/01/22  SUBJECTIVE:   SUBJECTIVE STATEMENT: Drove back and forth to [facility] and it put me in sever pain.  Pain at an 8/10 today.  Did not take pain medication this morning, stating;  "I should have." PERTINENT HISTORY: Hypertension, diabetes, osteoporosis, osteoarthritis, and current smoker PAIN:  Are you having pain? Yes: NPRS scale: 8/10 Pain location: left leg Pain description: sensitive, severe, stabbing Aggravating factors: moving her leg Relieving factors: ice, medication  PRECAUTIONS: None  WEIGHT BEARING RESTRICTIONS: No  FALLS:  Has patient fallen in last 6 months? No  LIVING ENVIRONMENT: Lives with: lives with their family Lives in: House/apartment Stairs: Yes: External: 5 steps; on right going up Has following equipment at home: Gilford Rile - 2 wheeled  OCCUPATION: retired  PLOF: Independent  PATIENT GOALS: reduced pain, play with her grandchild, improved mobility, and walk without a walker  NEXT MD VISIT: 2 weeks from surgery  OBJECTIVE:  PATIENT SURVEYS:  FOTO 19.19  COGNITION: Overall cognitive status: Within  functional limits for tasks assessed     SENSATION: Patient reports no numbness or tingling.   EDEMA:  Circumferential: L knee: 44 cm R: 37.5 cm   PALPATION: TTP: left quadriceps  LOWER EXTREMITY ROM:  Active ROM Right eval Left eval   Hip flexion     Hip extension     Hip abduction     Hip adduction     Hip internal rotation     Hip external rotation     Knee flexion 120 41/47 (PROM)  In supine: Active to 90 degrees.  Knee extension 0 9  -5(PROM)  Ankle dorsiflexion     Ankle plantarflexion     Ankle inversion     Ankle eversion      (Blank rows = not tested)  LOWER EXTREMITY MMT: not tested due to surgical condition  LOWER EXTREMITY SPECIAL TESTS:  Not tested due to surgical condition  GAIT: Assistive device utilized: Environmental consultant - 2 wheeled Level of assistance: Modified independence Comments: decreased gait speed, stride length, left knee flexed in stance, and decreased stance time on the left lower extremity   TODAY'S TREATMENT:                                                                                                                              DATE:                                     EXERCISE LOG 12/17/22:  Exercise Repetitions and Resistance Comments  Nustep (Level 2)  Seat 7 x 15 minutes   Rockerboard in parallel bars 3 minutes   8 inch box lunges in parallel bars 2 minutes   SAQ's (non-resisted) 3 minutes with 1#   Marching in parallel bars 1 minutes   In supine:  PROM to patient's left knee x 5 minutes.      Vasopneumatic with LE elevation on medium x 10 minutes.              PATIENT EDUCATION:  Education details: Encouraged patient to be compliant to her HEP which she admits she is not doing like she should.    Person educated: Patient Education method: Explanation Education comprehension: verbalized understanding  HOME EXERCISE PROGRAM: Patient did fairly well today.  Did not take pain medication and is not doing her HEP as instructed.  Added standing marching with bilateral UE support.   ASSESSMENT:  CLINICAL IMPRESSION: Patient continues to reports high pain-level.  PROM performed to patient tolerance and achieved flexion to 80 degrees. Continued to encourage her to remain compliant to her HEP.  Added marching with bilateral UE support to treatment today and HEP.   OBJECTIVE IMPAIRMENTS: Abnormal gait, decreased activity tolerance, decreased balance, decreased mobility, difficulty walking,  decreased ROM, decreased strength, hypomobility, increased edema, impaired flexibility, impaired tone, and pain.   ACTIVITY LIMITATIONS: carrying, lifting, sitting,  standing, squatting, sleeping, stairs, transfers, bed mobility, bathing, dressing, locomotion level, and caring for others  PARTICIPATION LIMITATIONS: meal prep, cleaning, laundry, driving, shopping, community activity, and yard work  PERSONAL FACTORS: Transportation and 3+ comorbidities: Hypertension, diabetes, osteoporosis, osteoarthritis, and current smoker  are also affecting patient's functional outcome.   REHAB POTENTIAL: Good  CLINICAL DECISION MAKING: Evolving/moderate complexity  EVALUATION COMPLEXITY: Moderate   GOALS: Goals reviewed with patient? Yes  LONG TERM GOALS: Target date: 01/01/23  Patient will be independent with her HEP. Baseline:  Goal status: INITIAL  2.  Patient will be able to safely ambulate at least 80 feet with a cane or the least restrictive assistive device for improved household mobility. Baseline:  Goal status: INITIAL  3.  Patient will be able to demonstrate active left knee extension within 5 degrees of neutral for improved gait mechanics.  Baseline:  Goal status: INITIAL  4.  Patient will be able to demonstrate at least 115 degrees of active left knee flexion for improved function navigating stairs. Baseline:  Goal status: INITIAL  PLAN:  PT FREQUENCY: 2-3x/week  PT DURATION: 4 weeks  PLANNED INTERVENTIONS: Therapeutic exercises, Therapeutic activity, Neuromuscular re-education, Balance training, Gait training, Patient/Family education, Self Care, Joint mobilization, Stair training, Electrical stimulation, Cryotherapy, Moist heat, Vasopneumatic device, Manual therapy, and Re-evaluation  PLAN FOR NEXT SESSION: NuStep, quad sets, heel slides, gastroc and hamstring stretching, manual therapy, and modalities as needed   Delenn Ahn, [place], PT 12/17/2022, 11:22 AM

## 2022-12-17 NOTE — Telephone Encounter (Signed)
Michelene Gardener PA form completed and faxed. Waiting to here back from insurance.

## 2022-12-19 ENCOUNTER — Ambulatory Visit: Payer: Medicare HMO | Attending: Orthopedic Surgery | Admitting: *Deleted

## 2022-12-19 ENCOUNTER — Encounter: Payer: Self-pay | Admitting: *Deleted

## 2022-12-19 DIAGNOSIS — R600 Localized edema: Secondary | ICD-10-CM | POA: Diagnosis not present

## 2022-12-19 DIAGNOSIS — M25562 Pain in left knee: Secondary | ICD-10-CM

## 2022-12-19 DIAGNOSIS — M25662 Stiffness of left knee, not elsewhere classified: Secondary | ICD-10-CM | POA: Diagnosis not present

## 2022-12-19 NOTE — Therapy (Signed)
OUTPATIENT PHYSICAL THERAPY LOWER EXTREMITY TREATMENT   Patient Name: Lindsay Villarreal MRN: [id number] DOB:[birth year], 77 y.o., female Today's Date: 12/19/2022  END OF SESSION:  PT End of Session - 12/19/22 0952     Visit Number 7    Number of Visits 12    Date for PT Re-Evaluation 01/02/23    PT Start Time 0945    PT Stop Time 8938    PT Time Calculation (min) 50 min              Past Medical History:  Diagnosis Date   Anemia    Po iron   Arthritis    Bilateral cataracts    Essential hypertension    GERD (gastroesophageal reflux disease)    Heart murmur    History of kidney stones    Hyperlipidemia    Neuromuscular disorder ([place])    Osteoporosis    Type 2 diabetes mellitus ([place])    Past Surgical History:  Procedure Laterality Date   APPENDECTOMY  11/17/1976   BLADDER REPAIR     CATARACT EXTRACTION W/ INTRAOCULAR LENS IMPLANT  2010 (left), 2013 (right)   COLONOSCOPY  04/30/2015   Dr.Perry   COLONOSCOPY  2013   EYE SURGERY     POLYPECTOMY     TOTAL KNEE ARTHROPLASTY Left 12/01/2022   Procedure: TOTAL KNEE ARTHROPLASTY;  Surgeon: Gaynelle Arabian, MD;  [facility];  Service: Orthopedics;  Laterality: Left;   TUBAL LIGATION     VAGINAL HYSTERECTOMY  11/17/1976   XI ROBOTIC ASSISTED INGUINAL HERNIA REPAIR WITH MESH Bilateral 02/22/2021   Procedure: XI ROBOTIC ASSISTED BILATERAL INGUINAL HERNIA REPAIR;  Surgeon: Clovis Riley, MD;  [facility];  Service: General;  Laterality: Bilateral;  2.5 HOURS   Patient Active Problem List   Diagnosis Date Noted   OA (osteoarthritis) of knee 12/01/2022   Primary osteoarthritis of left knee 12/01/2022   Inguinal hernia of right side without obstruction or gangrene 07/12/2020   Heart murmur 07/12/2020   Inguinal hernia of left side without obstruction or gangrene 07/12/2020   Gastroesophageal reflux disease without esophagitis 10/27/2016   Diabetic neuropathy associated with type 2 diabetes mellitus ([place]) 02/12/2015    Type 2 diabetes mellitus with complication, without long-term current use of insulin ([place]) 06/02/2013   Hypertension 06/02/2013   Anemia 12/16/2011   Osteoporosis 11/26/2011   Hyperlipidemia 11/26/2011    PCP: [name], FNP  REFERRING PROVIDER: Gaynelle Arabian, MD   REFERRING DIAG: S/P Left Total Knee Athroplasty   THERAPY DIAG:  Acute pain of left knee  Stiffness of left knee, not elsewhere classified  Localized edema  Rationale for Evaluation and Treatment: Rehabilitation  ONSET DATE: 12/01/22  SUBJECTIVE:   SUBJECTIVE STATEMENT: Knee is sore today.  Doing some exs at home. Went to MD last Tuesday and he said everything looks good   PERTINENT HISTORY: Hypertension, diabetes, osteoporosis, osteoarthritis, and current smoker PAIN:  Are you having pain? Yes: NPRS scale: 8/10 Pain location: left leg Pain description: sensitive, severe, stabbing Aggravating factors: moving her leg Relieving factors: ice, medication  PRECAUTIONS: None  WEIGHT BEARING RESTRICTIONS: No  FALLS:  Has patient fallen in last 6 months? No  LIVING ENVIRONMENT: Lives with: lives with their family Lives in: House/apartment Stairs: Yes: External: 5 steps; on right going up Has following equipment at home: Walker - 2 wheeled  OCCUPATION: retired  PLOF: Independent  PATIENT GOALS: reduced pain, play with her grandchild, improved mobility, and walk without a walker  NEXT  MD VISIT: 2 weeks from surgery  OBJECTIVE:  PATIENT SURVEYS:  FOTO 19.19  COGNITION: Overall cognitive status: Within functional limits for tasks assessed     SENSATION: Patient reports no numbness or tingling.   EDEMA:  Circumferential: L knee: 44 cm R: 37.5 cm   PALPATION: TTP: left quadriceps  LOWER EXTREMITY ROM:  Active ROM Right eval Left eval   Hip flexion     Hip extension     Hip abduction     Hip adduction     Hip internal rotation     Hip external rotation     Knee flexion  120 41/47 (PROM)  In supine: Active to 90 degrees.  Knee extension 0 9 -5(PROM)  Ankle dorsiflexion     Ankle plantarflexion     Ankle inversion     Ankle eversion      (Blank rows = not tested)  LOWER EXTREMITY MMT: not tested due to surgical condition  LOWER EXTREMITY SPECIAL TESTS:  Not tested due to surgical condition  GAIT: Assistive device utilized: Environmental consultant - 2 wheeled Level of assistance: Modified independence Comments: decreased gait speed, stride length, left knee flexed in stance, and decreased stance time on the left lower extremity   TODAY'S TREATMENT:                                                                                                                              DATE:                                     EXERCISE LOG 12/19/22:  Exercise Repetitions and Resistance Comments  Nustep (Level 2)  Seat 10,9, 8 x 15 minutes Pt stopped at seat 8 today due to pain  Rockerboard in parallel bars 5 minutes   8 inch box lunges in parallel bars  3x10 focus on flexion ROM   LAQs x20   SAQ's     Marching in parallel bars    Manual  PROM to patient's left knee in sitting. STW to quads and posterior aspect      Vasopneumatic with LE elevation on medium x 10 minutes.              PATIENT EDUCATION:  Education details: Encouraged patient to be compliant to her HEP which she admits she is not doing like she should.    Person educated: Patient Education method: Explanation Education comprehension: verbalized understanding  HOME EXERCISE PROGRAM: Patient did fairly well today.  Did not take pain medication and is not doing her HEP as instructed.  Added standing marching with bilateral UE support.   ASSESSMENT:  CLINICAL IMPRESSION: Patient continues to reports high pain-levels LT knee.  Rx focused on ROM progression as well as  strengthening LT LE. PROM and STW performed to patient's LT knee. ROM for flexion  to 80 degrees again. Vaso end  of session.  OBJECTIVE  IMPAIRMENTS: Abnormal gait, decreased activity tolerance, decreased balance, decreased mobility, difficulty walking, decreased ROM, decreased strength, hypomobility, increased edema, impaired flexibility, impaired tone, and pain.   ACTIVITY LIMITATIONS: carrying, lifting, sitting, standing, squatting, sleeping, stairs, transfers, bed mobility, bathing, dressing, locomotion level, and caring for others  PARTICIPATION LIMITATIONS: meal prep, cleaning, laundry, driving, shopping, community activity, and yard work  PERSONAL FACTORS: Transportation and 3+ comorbidities: Hypertension, diabetes, osteoporosis, osteoarthritis, and current smoker  are also affecting patient's functional outcome.   REHAB POTENTIAL: Good  CLINICAL DECISION MAKING: Evolving/moderate complexity  EVALUATION COMPLEXITY: Moderate   GOALS: Goals reviewed with patient? Yes  LONG TERM GOALS: Target date: 01/01/23  Patient will be independent with her HEP. Baseline:  Goal status: INITIAL  2.  Patient will be able to safely ambulate at least 80 feet with a cane or the least restrictive assistive device for improved household mobility. Baseline:  Goal status: INITIAL  3.  Patient will be able to demonstrate active left knee extension within 5 degrees of neutral for improved gait mechanics.  Baseline:  Goal status: INITIAL  4.  Patient will be able to demonstrate at least 115 degrees of active left knee flexion for improved function navigating stairs. Baseline:  Goal status: INITIAL  PLAN:  PT FREQUENCY: 2-3x/week  PT DURATION: 4 weeks  PLANNED INTERVENTIONS: Therapeutic exercises, Therapeutic activity, Neuromuscular re-education, Balance training, Gait training, Patient/Family education, Self Care, Joint mobilization, Stair training, Electrical stimulation, Cryotherapy, Moist heat, Vasopneumatic device, Manual therapy, and Re-evaluation  PLAN FOR NEXT SESSION: NuStep, quad sets, heel slides, gastroc and hamstring  stretching, manual therapy, and modalities as needed   Keyden Pavlov,CHRIS, PTA 12/19/2022, 12:20 PM

## 2022-12-22 ENCOUNTER — Ambulatory Visit: Payer: Medicare HMO | Admitting: Physical Therapy

## 2022-12-24 ENCOUNTER — Encounter: Payer: Self-pay | Admitting: Physical Therapy

## 2022-12-24 ENCOUNTER — Ambulatory Visit: Payer: Medicare HMO | Admitting: Physical Therapy

## 2022-12-24 DIAGNOSIS — R600 Localized edema: Secondary | ICD-10-CM | POA: Diagnosis not present

## 2022-12-24 DIAGNOSIS — M25562 Pain in left knee: Secondary | ICD-10-CM | POA: Diagnosis not present

## 2022-12-24 DIAGNOSIS — M25662 Stiffness of left knee, not elsewhere classified: Secondary | ICD-10-CM

## 2022-12-24 NOTE — Therapy (Signed)
OUTPATIENT PHYSICAL THERAPY LOWER EXTREMITY TREATMENT   Patient Name: Lindsay Villarreal MRN: [id number] DOB:[birth year], 77 y.o., female Today's Date: 12/24/2022  END OF SESSION:  PT End of Session - 12/24/22 1034     Visit Number 8    Number of Visits 12    Date for PT Re-Evaluation 01/02/23    PT Start Time 1031    PT Stop Time 1120    PT Time Calculation (min) 49 min    Activity Tolerance Patient limited by pain    Behavior During Therapy [facility] for tasks assessed/performed            Past Medical History:  Diagnosis Date   Anemia    Po iron   Arthritis    Bilateral cataracts    Essential hypertension    GERD (gastroesophageal reflux disease)    Heart murmur    History of kidney stones    Hyperlipidemia    Neuromuscular disorder ([place])    Osteoporosis    Type 2 diabetes mellitus ([place])    Past Surgical History:  Procedure Laterality Date   APPENDECTOMY  11/17/1976   BLADDER REPAIR     CATARACT EXTRACTION W/ INTRAOCULAR LENS IMPLANT  2010 (left), 2013 (right)   COLONOSCOPY  04/30/2015   Dr.Perry   COLONOSCOPY  2013   EYE SURGERY     POLYPECTOMY     TOTAL KNEE ARTHROPLASTY Left 12/01/2022   Procedure: TOTAL KNEE ARTHROPLASTY;  Surgeon: Gaynelle Arabian, MD;  Location: [facility];  Service: Orthopedics;  Laterality: Left;   TUBAL LIGATION     VAGINAL HYSTERECTOMY  11/17/1976   XI ROBOTIC ASSISTED INGUINAL HERNIA REPAIR WITH MESH Bilateral 02/22/2021   Procedure: XI ROBOTIC ASSISTED BILATERAL INGUINAL HERNIA REPAIR;  Surgeon: Clovis Riley, MD;  Location: [facility];  Service: General;  Laterality: Bilateral;  2.5 HOURS   Patient Active Problem List   Diagnosis Date Noted   OA (osteoarthritis) of knee 12/01/2022   Primary osteoarthritis of left knee 12/01/2022   Inguinal hernia of right side without obstruction or gangrene 07/12/2020   Heart murmur 07/12/2020   Inguinal hernia of left side without obstruction or gangrene 07/12/2020   Gastroesophageal reflux disease without  esophagitis 10/27/2016   Diabetic neuropathy associated with type 2 diabetes mellitus ([place]) 02/12/2015   Type 2 diabetes mellitus with complication, without long-term current use of insulin ([place]) 06/02/2013   Hypertension 06/02/2013   Anemia 12/16/2011   Osteoporosis 11/26/2011   Hyperlipidemia 11/26/2011   PCP: [name], FNP  REFERRING PROVIDER: Gaynelle Arabian, MD   REFERRING DIAG: S/P Left Total Knee Athroplasty   THERAPY DIAG:  Acute pain of left knee  Stiffness of left knee, not elsewhere classified  Localized edema  Rationale for Evaluation and Treatment: Rehabilitation  ONSET DATE: 12/01/22  SUBJECTIVE:   SUBJECTIVE STATEMENT: Thinks she may have overstretched and pulled a muscle in posterior knee yesterday which caused more pain.  PERTINENT HISTORY: Hypertension, diabetes, osteoporosis, osteoarthritis, and current smoker  PAIN:  Are you having pain? Yes: NPRS scale: 5/10 Pain location: left leg Pain description: sensitive, severe, stabbing Aggravating factors: moving her leg Relieving factors: ice, medication  PRECAUTIONS: None  PATIENT GOALS: reduced pain, play with her grandchild, improved mobility, and walk without a walker  NEXT MD VISIT:   OBJECTIVE:  PATIENT SURVEYS:  FOTO 19.19  EDEMA:  Circumferential: L knee: 44 cm R: 37.5 cm   LOWER EXTREMITY ROM:  Active ROM Right eval Left eval   Hip flexion  Hip extension     Hip abduction     Hip adduction     Hip internal rotation     Hip external rotation     Knee flexion 120 41/47 (PROM)  In supine: Active to 90 degrees.  Knee extension 0 9 -5(PROM)  Ankle dorsiflexion     Ankle plantarflexion     Ankle inversion     Ankle eversion      (Blank rows = not tested)  LOWER EXTREMITY MMT: not tested due to surgical condition  LOWER EXTREMITY SPECIAL TESTS:  Not tested due to surgical condition  GAIT: Assistive device utilized: Environmental consultant - 2 wheeled Level of assistance:  Modified independence Comments: decreased gait speed, stride length, left knee flexed in stance, and decreased stance time on the left lower extremity  TODAY'S TREATMENT:                                                                                                                              DATE:                                     EXERCISE LOG 12/24/22:  Exercise Repetitions and Resistance Comments  Nustep (Level 2)  Seat 9, 8 x 15 minutes Pt stopped at seat 8 today due to pain  Rockerboard in parallel bars 5 minutes   8 inch box lunges in parallel bars  2x10 focus on flexion ROM   LAQs 2# x20 reps, AROM x10 reps   SAQ's  AROM x20 reps   Toe taps to 8" box  X20 reps VC to avoid hip circumduction   Modalities  Date:  Vaso: Knee, Low, 10 mins, Pain and Edema  PATIENT EDUCATION:  Education details: Encouraged patient to be compliant to her HEP which she admits she is not doing like she should.    Person educated: Patient Education method: Explanation Education comprehension: verbalized understanding  HOME EXERCISE PROGRAM: Patient did fairly well today.  Did not take pain medication and is not doing her HEP as instructed.  Added standing marching with bilateral UE support.    ASSESSMENT:  CLINICAL IMPRESSION: Patient presented in clinic with reports of more discomfort in posterior knee. Patient guided through therex for ROM and hip motion correction as patient observed with hip circumduction due to avoidance of knee flexion. Patient reports soreness of medial knee. Greater LAQ tolerance with AROM as noted after ankle weight removed. Mild edema notable in posterior knee with increased tightness of the L gastroc. Normal vasopneumatic response noted following removal of the modality.  OBJECTIVE IMPAIRMENTS: Abnormal gait, decreased activity tolerance, decreased balance, decreased mobility, difficulty walking, decreased ROM, decreased strength, hypomobility, increased edema, impaired  flexibility, impaired tone, and pain.   ACTIVITY LIMITATIONS: carrying, lifting, sitting, standing, squatting, sleeping, stairs, transfers, bed mobility, bathing, dressing, locomotion level, and caring for others  PARTICIPATION LIMITATIONS:  meal prep, cleaning, laundry, driving, shopping, community activity, and yard work  PERSONAL FACTORS: Transportation and 3+ comorbidities: Hypertension, diabetes, osteoporosis, osteoarthritis, and current smoker  are also affecting patient's functional outcome.   REHAB POTENTIAL: Good  CLINICAL DECISION MAKING: Evolving/moderate complexity  EVALUATION COMPLEXITY: Moderate  GOALS: Goals reviewed with patient? Yes  LONG TERM GOALS: Target date: 01/01/23  Patient will be independent with her HEP. Baseline:  Goal status: INITIAL  2.  Patient will be able to safely ambulate at least 80 feet with a cane or the least restrictive assistive device for improved household mobility. Baseline:  Goal status: INITIAL  3.  Patient will be able to demonstrate active left knee extension within 5 degrees of neutral for improved gait mechanics.  Baseline:  Goal status: INITIAL  4.  Patient will be able to demonstrate at least 115 degrees of active left knee flexion for improved function navigating stairs. Baseline:  Goal status: INITIAL  PLAN:  PT FREQUENCY: 2-3x/week  PT DURATION: 4 weeks  PLANNED INTERVENTIONS: Therapeutic exercises, Therapeutic activity, Neuromuscular re-education, Balance training, Gait training, Patient/Family education, Self Care, Joint mobilization, Stair training, Electrical stimulation, Cryotherapy, Moist heat, Vasopneumatic device, Manual therapy, and Re-evaluation  PLAN FOR NEXT SESSION: NuStep, quad sets, heel slides, gastroc and hamstring stretching, manual therapy, and modalities as needed  Standley Brooking, PTA 12/24/2022, 12:21 PM

## 2022-12-26 ENCOUNTER — Ambulatory Visit: Payer: Medicare HMO | Admitting: *Deleted

## 2022-12-26 ENCOUNTER — Encounter: Payer: Self-pay | Admitting: *Deleted

## 2022-12-26 DIAGNOSIS — R600 Localized edema: Secondary | ICD-10-CM | POA: Diagnosis not present

## 2022-12-26 DIAGNOSIS — M25662 Stiffness of left knee, not elsewhere classified: Secondary | ICD-10-CM

## 2022-12-26 DIAGNOSIS — M25562 Pain in left knee: Secondary | ICD-10-CM

## 2022-12-26 NOTE — Therapy (Signed)
OUTPATIENT PHYSICAL THERAPY LOWER EXTREMITY TREATMENT   Patient Name: Lindsay Villarreal MRN: [id number] DOB:[birth year], 77 y.o., female Today's Date: 12/26/2022  END OF SESSION:  PT End of Session - 12/26/22 1033     Visit Number 9    Number of Visits 12    Date for PT Re-Evaluation 01/02/23    PT Start Time 1030    PT Stop Time [id number]    PT Time Calculation (min) 56 min            Past Medical History:  Diagnosis Date   Anemia    Po iron   Arthritis    Bilateral cataracts    Essential hypertension    GERD (gastroesophageal reflux disease)    Heart murmur    History of kidney stones    Hyperlipidemia    Neuromuscular disorder ([place])    Osteoporosis    Type 2 diabetes mellitus ([place])    Past Surgical History:  Procedure Laterality Date   APPENDECTOMY  11/17/1976   BLADDER REPAIR     CATARACT EXTRACTION W/ INTRAOCULAR LENS IMPLANT  2010 (left), 2013 (right)   COLONOSCOPY  04/30/2015   Dr.Perry   COLONOSCOPY  2013   EYE SURGERY     POLYPECTOMY     TOTAL KNEE ARTHROPLASTY Left 12/01/2022   Procedure: TOTAL KNEE ARTHROPLASTY;  Surgeon: Gaynelle Arabian, MD;  [facility]: [facility];  Service: Orthopedics;  Laterality: Left;   TUBAL LIGATION     VAGINAL HYSTERECTOMY  11/17/1976   XI ROBOTIC ASSISTED INGUINAL HERNIA REPAIR WITH MESH Bilateral 02/22/2021   Procedure: XI ROBOTIC ASSISTED BILATERAL INGUINAL HERNIA REPAIR;  Surgeon: Clovis Riley, MD;  [facility];  Service: General;  Laterality: Bilateral;  2.5 HOURS   Patient Active Problem List   Diagnosis Date Noted   OA (osteoarthritis) of knee 12/01/2022   Primary osteoarthritis of left knee 12/01/2022   Inguinal hernia of right side without obstruction or gangrene 07/12/2020   Heart murmur 07/12/2020   Inguinal hernia of left side without obstruction or gangrene 07/12/2020   Gastroesophageal reflux disease without esophagitis 10/27/2016   Diabetic neuropathy associated with type 2 diabetes mellitus ([place]) 02/12/2015    Type 2 diabetes mellitus with complication, without long-term current use of insulin ([place]) 06/02/2013   Hypertension 06/02/2013   Anemia 12/16/2011   Osteoporosis 11/26/2011   Hyperlipidemia 11/26/2011   PCP: [name], FNP  REFERRING PROVIDER: Gaynelle Arabian, MD   REFERRING DIAG: S/P Left Total Knee Athroplasty   THERAPY DIAG:  Acute pain of left knee  Stiffness of left knee, not elsewhere classified  Localized edema  Rationale for Evaluation and Treatment: Rehabilitation  ONSET DATE: 12/01/22  SUBJECTIVE:   SUBJECTIVE STATEMENT: Doing better today  PERTINENT HISTORY: Hypertension, diabetes, osteoporosis, osteoarthritis, and current smoker  PAIN:  Are you having pain? Yes: NPRS scale: 5-6/10 Pain location: left leg Pain description: sensitive, severe, stabbing Aggravating factors: moving her leg Relieving factors: ice, medication  PRECAUTIONS: None  PATIENT GOALS: reduced pain, play with her grandchild, improved mobility, and walk without a walker  NEXT MD VISIT:   OBJECTIVE:  PATIENT SURVEYS:  FOTO 19.19  EDEMA:  Circumferential: L knee: 44 cm R: 37.5 cm   LOWER EXTREMITY ROM:  Active ROM Right eval Left eval   Hip flexion     Hip extension     Hip abduction     Hip adduction     Hip internal rotation     Hip external rotation  Knee flexion 120 41/47 (PROM)  In supine: Active to 90 degrees.  Knee extension 0 9 -5(PROM)  Ankle dorsiflexion     Ankle plantarflexion     Ankle inversion     Ankle eversion      (Blank rows = not tested)  LOWER EXTREMITY MMT: not tested due to surgical condition  LOWER EXTREMITY SPECIAL TESTS:  Not tested due to surgical condition  GAIT: Assistive device utilized: Environmental consultant - 2 wheeled Level of assistance: Modified independence Comments: decreased gait speed, stride length, left knee flexed in stance, and decreased stance time on the left lower extremity  TODAY'S TREATMENT:                                                                                                                               DATE:                                     EXERCISE LOG 12/26/22:    LT TKR  Exercise Repetitions and Resistance Comments  Nustep (Level 2)  Seat 9, 8,7  x 15 minutes   Rockerboard in parallel bars 5 minutes   8 inch box lunges in parallel bars  2x10 focus on flexion ROM   LAQs 2# x20 reps, AROM x10 reps   SAQ's     Toe taps to 8" box  X10 reps VC to avoid hip circumduction  Manual PROM for flexion and extension as well as patella/ scar  mobs and STW to quads and HS's Modalities  Date:  IFC x15 mins80-150hz$  at 12v to LT knee Vaso: Knee, Low, 10 mins, Pain and Edema     LT knee  PATIENT EDUCATION:  Education details: Encouraged patient to be compliant to her HEP which she admits she is not doing like she should.    Person educated: Patient Education method: Explanation Education comprehension: verbalized understanding  HOME EXERCISE PROGRAM: Patient did fairly well today.  Did not take pain medication and is not doing her HEP as instructed.  Added standing marching with bilateral UE support.    ASSESSMENT:  CLINICAL IMPRESSION: Pt arrived today doing fairly well LT knee 6/10. She was able to progress  more today and tolerated PROM better. She was able to reach 10-98 degrees today. IFC and Vaso end of session. Pt encouraged to perform HEP for ROM progression.  OBJECTIVE IMPAIRMENTS: Abnormal gait, decreased activity tolerance, decreased balance, decreased mobility, difficulty walking, decreased ROM, decreased strength, hypomobility, increased edema, impaired flexibility, impaired tone, and pain.   ACTIVITY LIMITATIONS: carrying, lifting, sitting, standing, squatting, sleeping, stairs, transfers, bed mobility, bathing, dressing, locomotion level, and caring for others  PARTICIPATION LIMITATIONS: meal prep, cleaning, laundry, driving, shopping, community activity, and yard  work  PERSONAL FACTORS: Transportation and 3+ comorbidities: Hypertension, diabetes, osteoporosis, osteoarthritis, and current smoker  are also affecting patient's functional outcome.  REHAB POTENTIAL: Good  CLINICAL DECISION MAKING: Evolving/moderate complexity  EVALUATION COMPLEXITY: Moderate  GOALS: Goals reviewed with patient? Yes  LONG TERM GOALS: Target date: 01/01/23  Patient will be independent with her HEP. Baseline:  Goal status: INITIAL  2.  Patient will be able to safely ambulate at least 80 feet with a cane or the least restrictive assistive device for improved household mobility. Baseline:  Goal status: INITIAL  3.  Patient will be able to demonstrate active left knee extension within 5 degrees of neutral for improved gait mechanics.  Baseline:  Goal status: INITIAL  4.  Patient will be able to demonstrate at least 115 degrees of active left knee flexion for improved function navigating stairs. Baseline:  Goal status: INITIAL  PLAN:  PT FREQUENCY: 2-3x/week  PT DURATION: 4 weeks  PLANNED INTERVENTIONS: Therapeutic exercises, Therapeutic activity, Neuromuscular re-education, Balance training, Gait training, Patient/Family education, Self Care, Joint mobilization, Stair training, Electrical stimulation, Cryotherapy, Moist heat, Vasopneumatic device, Manual therapy, and Re-evaluation  PLAN FOR NEXT SESSION: NuStep, quad sets, heel slides, gastroc and hamstring stretching, manual therapy, and modalities as needed  Zaelynn Fuchs,CHRIS, PTA 12/26/2022, 11:32 AM

## 2022-12-26 NOTE — Telephone Encounter (Signed)
Patient aware that her Prolia has been approved. Appointment scheduled for 02/13

## 2022-12-29 ENCOUNTER — Ambulatory Visit: Payer: Medicare HMO

## 2022-12-29 DIAGNOSIS — M25562 Pain in left knee: Secondary | ICD-10-CM

## 2022-12-29 DIAGNOSIS — R600 Localized edema: Secondary | ICD-10-CM

## 2022-12-29 DIAGNOSIS — M25662 Stiffness of left knee, not elsewhere classified: Secondary | ICD-10-CM

## 2022-12-29 NOTE — Therapy (Signed)
OUTPATIENT PHYSICAL THERAPY LOWER EXTREMITY TREATMENT   Patient Name: Lindsay Villarreal MRN: [id number] DOB:28-Jan-1946, 77 y.o., female Today's Date: 12/29/2022  END OF SESSION:  PT End of Session - 12/29/22 0946     Visit Number 10    Number of Visits 12    Date for PT Re-Evaluation 01/02/23    PT Start Time 0945    PT Stop Time [id number]    PT Time Calculation (min) 55 min            Past Medical History:  Diagnosis Date   Anemia    Po iron   Arthritis    Bilateral cataracts    Essential hypertension    GERD (gastroesophageal reflux disease)    Heart murmur    History of kidney stones    Hyperlipidemia    Neuromuscular disorder ([place])    Osteoporosis    Type 2 diabetes mellitus ([place])    Past Surgical History:  Procedure Laterality Date   APPENDECTOMY  11/17/1976   BLADDER REPAIR     CATARACT EXTRACTION W/ INTRAOCULAR LENS IMPLANT  2010 (left), 2013 (right)   COLONOSCOPY  04/30/2015   Dr.Perry   COLONOSCOPY  2013   EYE SURGERY     POLYPECTOMY     TOTAL KNEE ARTHROPLASTY Left 12/01/2022   Procedure: TOTAL KNEE ARTHROPLASTY;  Surgeon: Gaynelle Arabian, MD;  Location: [facility];  Service: Orthopedics;  Laterality: Left;   TUBAL LIGATION     VAGINAL HYSTERECTOMY  11/17/1976   XI ROBOTIC ASSISTED INGUINAL HERNIA REPAIR WITH MESH Bilateral 02/22/2021   Procedure: XI ROBOTIC ASSISTED BILATERAL INGUINAL HERNIA REPAIR;  Surgeon: Clovis Riley, MD;  Location: [facility];  Service: General;  Laterality: Bilateral;  2.5 HOURS   Patient Active Problem List   Diagnosis Date Noted   OA (osteoarthritis) of knee 12/01/2022   Primary osteoarthritis of left knee 12/01/2022   Inguinal hernia of right side without obstruction or gangrene 07/12/2020   Heart murmur 07/12/2020   Inguinal hernia of left side without obstruction or gangrene 07/12/2020   Gastroesophageal reflux disease without esophagitis 10/27/2016   Diabetic neuropathy associated with type 2 diabetes mellitus ([place]) 02/12/2015    Type 2 diabetes mellitus with complication, without long-term current use of insulin ([place]) 06/02/2013   Hypertension 06/02/2013   Anemia 12/16/2011   Osteoporosis 11/26/2011   Hyperlipidemia 11/26/2011   PCP: [name], FNP  REFERRING PROVIDER: Gaynelle Arabian, MD   REFERRING DIAG: S/P Left Total Knee Athroplasty   THERAPY DIAG:  Acute pain of left knee  Stiffness of left knee, not elsewhere classified  Localized edema  Rationale for Evaluation and Treatment: Rehabilitation  ONSET DATE: 12/01/22  SUBJECTIVE:   SUBJECTIVE STATEMENT: Pt reports 5/10 left knee pain today.   PERTINENT HISTORY: Hypertension, diabetes, osteoporosis, osteoarthritis, and current smoker  PAIN:  Are you having pain? Yes: NPRS scale: 5/10 Pain location: left leg Pain description: sensitive, severe, stabbing Aggravating factors: moving her leg Relieving factors: ice, medication  PRECAUTIONS: None  PATIENT GOALS: reduced pain, play with her grandchild, improved mobility, and walk without a walker  NEXT MD VISIT:   OBJECTIVE:  PATIENT SURVEYS:  FOTO 19.19  EDEMA:  Circumferential: L knee: 44 cm R: 37.5 cm   LOWER EXTREMITY ROM:  Active ROM Right eval Left eval   Hip flexion     Hip extension     Hip abduction     Hip adduction     Hip internal rotation     Hip  external rotation     Knee flexion 120 41/47 (PROM)  In supine: Active to 90 degrees.  Knee extension 0 9 -5(PROM)  Ankle dorsiflexion     Ankle plantarflexion     Ankle inversion     Ankle eversion      (Blank rows = not tested)  LOWER EXTREMITY MMT: not tested due to surgical condition  LOWER EXTREMITY SPECIAL TESTS:  Not tested due to surgical condition  GAIT: Assistive device utilized: Environmental consultant - 2 wheeled Level of assistance: Modified independence Comments: decreased gait speed, stride length, left knee flexed in stance, and decreased stance time on the left lower extremity  TODAY'S TREATMENT:                                                                                                                               DATE:                                EXERCISE LOG 12/29/22:    LT TKR  Exercise Repetitions and Resistance Comments  Nustep (Level 2)  Seat 7, 6 x 15 minutes   Rockerboard in parallel bars 5 minutes   8 inch box lunges in parallel bars X3.5 mins   LAQs 2# x25   HS Stretch 2 reps x 1 mins   STS  VC to avoid hip circumduction  Manual PROM for flexion and extension as well as patella/ scar  mobs and STW to quads and HS's Modalities  Date:  IFC x15 mins80-150hz$  at to LT knee Vaso: Knee, Low, 15 mins, Pain and Edema     LT knee  PATIENT EDUCATION:  Education details: Encouraged patient to be compliant to her HEP which she admits she is not doing like she should.    Person educated: Patient Education method: Explanation Education comprehension: verbalized understanding  HOME EXERCISE PROGRAM: Patient did fairly well today.  Did not take pain medication and is not doing her HEP as instructed.  Added standing marching with bilateral UE support.    ASSESSMENT:  CLINICAL IMPRESSION: Pt arrives for today's treatment session reporting 5/10 left knee pain.  Pt able to increase FOTO score to 66 today.  Pt able to tolerate progress in Nustep seat to 6 today with minimal discomfort.  Pt instructed in seated hamstring stretch to assist with hamstring tightness,  added to HEP.  Pt able to tolerate increased reps with seated LAQ without complaint of pain.  Pt demonstrates -14 degrees of active extension and 97 degrees of active left knee flexion.  Normal responses to estim and vaso noted upon removal.  Pt reported decreased pain at completion of today's treatment session.   OBJECTIVE IMPAIRMENTS: Abnormal gait, decreased activity tolerance, decreased balance, decreased mobility, difficulty walking, decreased ROM, decreased strength, hypomobility, increased edema, impaired flexibility,  impaired tone, and pain.   ACTIVITY LIMITATIONS: carrying, lifting, sitting, standing, squatting,  sleeping, stairs, transfers, bed mobility, bathing, dressing, locomotion level, and caring for others  PARTICIPATION LIMITATIONS: meal prep, cleaning, laundry, driving, shopping, community activity, and yard work  PERSONAL FACTORS: Transportation and 3+ comorbidities: Hypertension, diabetes, osteoporosis, osteoarthritis, and current smoker  are also affecting patient's functional outcome.   REHAB POTENTIAL: Good  CLINICAL DECISION MAKING: Evolving/moderate complexity  EVALUATION COMPLEXITY: Moderate  GOALS: Goals reviewed with patient? Yes  LONG TERM GOALS: Target date: 01/01/23  Patient will be independent with her HEP. Baseline:  Goal status: MET  2.  Patient will be able to safely ambulate at least 80 feet with a cane or the least restrictive assistive device for improved household mobility. Baseline:  Goal status: MET  3.  Patient will be able to demonstrate active left knee extension within 5 degrees of neutral for improved gait mechanics.  Baseline:2/12: -14 degrees  Goal status: IN PROGRESS  4.  Patient will be able to demonstrate at least 115 degrees of active left knee flexion for improved function navigating stairs. Baseline: 2/12: 97 degrees Goal status: IN PROGRESS  PLAN:  PT FREQUENCY: 2-3x/week  PT DURATION: 4 weeks  PLANNED INTERVENTIONS: Therapeutic exercises, Therapeutic activity, Neuromuscular re-education, Balance training, Gait training, Patient/Family education, Self Care, Joint mobilization, Stair training, Electrical stimulation, Cryotherapy, Moist heat, Vasopneumatic device, Manual therapy, and Re-evaluation  PLAN FOR NEXT SESSION: NuStep, quad sets, heel slides, gastroc and hamstring stretching, manual therapy, and modalities as needed  Kathrynn Ducking, PTA 12/29/2022, 10:46 AM

## 2022-12-31 ENCOUNTER — Ambulatory Visit (INDEPENDENT_AMBULATORY_CARE_PROVIDER_SITE_OTHER): Payer: Medicare HMO

## 2022-12-31 ENCOUNTER — Encounter: Payer: Self-pay | Admitting: Physical Therapy

## 2022-12-31 ENCOUNTER — Ambulatory Visit: Payer: Medicare HMO | Admitting: Physical Therapy

## 2022-12-31 DIAGNOSIS — R600 Localized edema: Secondary | ICD-10-CM

## 2022-12-31 DIAGNOSIS — M25562 Pain in left knee: Secondary | ICD-10-CM | POA: Diagnosis not present

## 2022-12-31 DIAGNOSIS — M25662 Stiffness of left knee, not elsewhere classified: Secondary | ICD-10-CM

## 2022-12-31 DIAGNOSIS — M810 Age-related osteoporosis without current pathological fracture: Secondary | ICD-10-CM

## 2022-12-31 MED ORDER — DENOSUMAB 60 MG/ML ~~LOC~~ SOSY
60.0000 mg | PREFILLED_SYRINGE | Freq: Once | SUBCUTANEOUS | Status: AC
  Administered 2022-12-31: 60 mg via SUBCUTANEOUS

## 2022-12-31 NOTE — Progress Notes (Signed)
Prolia injection given to left arm.  Patient tolerated well.

## 2022-12-31 NOTE — Therapy (Signed)
OUTPATIENT PHYSICAL THERAPY LOWER EXTREMITY TREATMENT   Patient Name: Lindsay Villarreal MRN: [id number] DOB:01-11-1946, 77 y.o., female Today's Date: 12/31/2022  END OF SESSION:  PT End of Session - 12/31/22 0950     Visit Number 11    Number of Visits 12    Date for PT Re-Evaluation 01/02/23    PT Start Time 0946    PT Stop Time [id number]    PT Time Calculation (min) 49 min    Activity Tolerance Patient limited by pain    Behavior During Therapy [facility] for tasks assessed/performed            Past Medical History:  Diagnosis Date   Anemia    Po iron   Arthritis    Bilateral cataracts    Essential hypertension    GERD (gastroesophageal reflux disease)    Heart murmur    History of kidney stones    Hyperlipidemia    Neuromuscular disorder ([place])    Osteoporosis    Type 2 diabetes mellitus ([place])    Past Surgical History:  Procedure Laterality Date   APPENDECTOMY  11/17/1976   BLADDER REPAIR     CATARACT EXTRACTION W/ INTRAOCULAR LENS IMPLANT  2010 (left), 2013 (right)   COLONOSCOPY  04/30/2015   Dr.Perry   COLONOSCOPY  2013   EYE SURGERY     POLYPECTOMY     TOTAL KNEE ARTHROPLASTY Left 12/01/2022   Procedure: TOTAL KNEE ARTHROPLASTY;  Surgeon: Gaynelle Arabian, MD;  Location: [facility];  Service: Orthopedics;  Laterality: Left;   TUBAL LIGATION     VAGINAL HYSTERECTOMY  11/17/1976   XI ROBOTIC ASSISTED INGUINAL HERNIA REPAIR WITH MESH Bilateral 02/22/2021   Procedure: XI ROBOTIC ASSISTED BILATERAL INGUINAL HERNIA REPAIR;  Surgeon: Clovis Riley, MD;  Location: [facility];  Service: General;  Laterality: Bilateral;  2.5 HOURS   Patient Active Problem List   Diagnosis Date Noted   OA (osteoarthritis) of knee 12/01/2022   Primary osteoarthritis of left knee 12/01/2022   Inguinal hernia of right side without obstruction or gangrene 07/12/2020   Heart murmur 07/12/2020   Inguinal hernia of left side without obstruction or gangrene 07/12/2020   Gastroesophageal reflux disease  without esophagitis 10/27/2016   Diabetic neuropathy associated with type 2 diabetes mellitus ([place]) 02/12/2015   Type 2 diabetes mellitus with complication, without long-term current use of insulin ([place]) 06/02/2013   Hypertension 06/02/2013   Anemia 12/16/2011   Osteoporosis 11/26/2011   Hyperlipidemia 11/26/2011   PCP: [name], FNP  REFERRING PROVIDER: Gaynelle Arabian, MD   REFERRING DIAG: S/P Left Total Knee Athroplasty   THERAPY DIAG:  Acute pain of left knee  Stiffness of left knee, not elsewhere classified  Localized edema  Rationale for Evaluation and Treatment: Rehabilitation  ONSET DATE: 12/01/22  SUBJECTIVE:   SUBJECTIVE STATEMENT: Reports 6/10 pain and reports she did not sleep well last night due to pain.  PERTINENT HISTORY: Hypertension, diabetes, osteoporosis, osteoarthritis, and current smoker  PAIN:  Are you having pain? Yes: NPRS scale: 6/10 Pain location: left leg Pain description: sensitive, severe, stabbing Aggravating factors: moving her leg Relieving factors: ice, medication  PRECAUTIONS: None  PATIENT GOALS: reduced pain, play with her grandchild, improved mobility, and walk without a walker  NEXT MD VISIT:   OBJECTIVE:  PATIENT SURVEYS:  FOTO 19.19  EDEMA:  Circumferential: L knee: 44 cm R: 37.5 cm   LOWER EXTREMITY ROM:  Active ROM Right eval Left eval  Left AROM 12/31/22  Hip flexion  Hip extension      Hip abduction      Hip adduction      Hip internal rotation      Hip external rotation      Knee flexion 120 41/47 (PROM)  In supine: Active to 90 degrees.   Knee extension 0 9 -5(PROM) 9  Ankle dorsiflexion      Ankle plantarflexion      Ankle inversion      Ankle eversion       (Blank rows = not tested)  LOWER EXTREMITY MMT: not tested due to surgical condition  TODAY'S TREATMENT:                                                                                                                               DATE:                            EXERCISE LOG 12/31/22:    LT TKR  Exercise Repetitions and Resistance Comments  Nustep  L3, seat 8 x 15 minutes   Rockerboard  X4 min   8 inch box lunges X20 reps   HS Stretch Multiple reps x2 min total   Heel prop for extension X4 min 2#    Modalities  Date:  Unattended Estim: Knee, IFC, 15 mins, Pain Vaso: Knee, Low, 15 mins, Pain  PATIENT EDUCATION:  Education details: Encouraged patient to be compliant to her HEP which she admits she is not doing like she should.    Person educated: Patient Education method: Explanation Education comprehension: verbalized understanding  HOME EXERCISE PROGRAM: Patient did fairly well today.  Did not take pain medication and is not doing her HEP as instructed.  Added standing marching with bilateral UE support.    ASSESSMENT:  CLINICAL IMPRESSION: Patient presented in clinic with increased pain of L knee and did not sleep well last night due to pain. Patient very hesistant for any knee ROM due to pain that occurs and has been very cautious due to pain since surgery.  Patient observed with knee flexion in stance phase of gait but not using AD. Antalgic gait deviations noted during gait with no AD. 9 deg lacking of full knee extension after heel prop for prolonged time. Normal modalities response noted following removal of the modalities.  OBJECTIVE IMPAIRMENTS: Abnormal gait, decreased activity tolerance, decreased balance, decreased mobility, difficulty walking, decreased ROM, decreased strength, hypomobility, increased edema, impaired flexibility, impaired tone, and pain.   ACTIVITY LIMITATIONS: carrying, lifting, sitting, standing, squatting, sleeping, stairs, transfers, bed mobility, bathing, dressing, locomotion level, and caring for others  PARTICIPATION LIMITATIONS: meal prep, cleaning, laundry, driving, shopping, community activity, and yard work  PERSONAL FACTORS: Transportation and 3+ comorbidities:  Hypertension, diabetes, osteoporosis, osteoarthritis, and current smoker  are also affecting patient's functional outcome.   REHAB POTENTIAL: Good  CLINICAL DECISION MAKING: Evolving/moderate complexity  EVALUATION COMPLEXITY: Moderate  GOALS: Goals  reviewed with patient? Yes  LONG TERM GOALS: Target date: 01/01/23  Patient will be independent with her HEP. Baseline:  Goal status: MET  2.  Patient will be able to safely ambulate at least 80 feet with a cane or the least restrictive assistive device for improved household mobility. Baseline:  Goal status: MET  3.  Patient will be able to demonstrate active left knee extension within 5 degrees of neutral for improved gait mechanics.  Baseline:2/12: -14 degrees  Goal status: IN PROGRESS  4.  Patient will be able to demonstrate at least 115 degrees of active left knee flexion for improved function navigating stairs. Baseline: 2/12: 97 degrees Goal status: IN PROGRESS  PLAN:  PT FREQUENCY: 2-3x/week  PT DURATION: 4 weeks  PLANNED INTERVENTIONS: Therapeutic exercises, Therapeutic activity, Neuromuscular re-education, Balance training, Gait training, Patient/Family education, Self Care, Joint mobilization, Stair training, Electrical stimulation, Cryotherapy, Moist heat, Vasopneumatic device, Manual therapy, and Re-evaluation  PLAN FOR NEXT SESSION: NuStep, quad sets, heel slides, gastroc and hamstring stretching, manual therapy, and modalities as needed  Standley Brooking, PTA 12/31/2022, 10:37 AM

## 2023-01-02 ENCOUNTER — Ambulatory Visit: Payer: Medicare HMO | Admitting: *Deleted

## 2023-01-02 ENCOUNTER — Encounter: Payer: Self-pay | Admitting: *Deleted

## 2023-01-02 DIAGNOSIS — M25662 Stiffness of left knee, not elsewhere classified: Secondary | ICD-10-CM

## 2023-01-02 DIAGNOSIS — M25562 Pain in left knee: Secondary | ICD-10-CM

## 2023-01-02 DIAGNOSIS — R600 Localized edema: Secondary | ICD-10-CM

## 2023-01-02 NOTE — Therapy (Addendum)
OUTPATIENT PHYSICAL THERAPY LOWER EXTREMITY TREATMENT   Patient Name: Lindsay Villarreal MRN: [id number] DOB:Feb 07, 1946, 77 y.o., female Today's Date: 01/02/2023  END OF SESSION:  PT End of Session - 01/02/23 0959     Visit Number 12    Number of Visits 12    Date for PT Re-Evaluation 01/02/23    PT Start Time 0945    PT Stop Time [id number]    PT Time Calculation (min) 59 min            Past Medical History:  Diagnosis Date   Anemia    Po iron   Arthritis    Bilateral cataracts    Essential hypertension    GERD (gastroesophageal reflux disease)    Heart murmur    History of kidney stones    Hyperlipidemia    Neuromuscular disorder ([place])    Osteoporosis    Type 2 diabetes mellitus ([place])    Past Surgical History:  Procedure Laterality Date   APPENDECTOMY  11/17/1976   BLADDER REPAIR     CATARACT EXTRACTION W/ INTRAOCULAR LENS IMPLANT  2010 (left), 2013 (right)   COLONOSCOPY  04/30/2015   Dr.Perry   COLONOSCOPY  2013   EYE SURGERY     POLYPECTOMY     TOTAL KNEE ARTHROPLASTY Left 12/01/2022   Procedure: TOTAL KNEE ARTHROPLASTY;  Surgeon: Gaynelle Arabian, MD;  [facility];  Service: Orthopedics;  Laterality: Left;   TUBAL LIGATION     VAGINAL HYSTERECTOMY  11/17/1976   XI ROBOTIC ASSISTED INGUINAL HERNIA REPAIR WITH MESH Bilateral 02/22/2021   Procedure: XI ROBOTIC ASSISTED BILATERAL INGUINAL HERNIA REPAIR;  Surgeon: Clovis Riley, MD;  [facility];  Service: General;  Laterality: Bilateral;  2.5 HOURS   Patient Active Problem List   Diagnosis Date Noted   OA (osteoarthritis) of knee 12/01/2022   Primary osteoarthritis of left knee 12/01/2022   Inguinal hernia of right side without obstruction or gangrene 07/12/2020   Heart murmur 07/12/2020   Inguinal hernia of left side without obstruction or gangrene 07/12/2020   Gastroesophageal reflux disease without esophagitis 10/27/2016   Diabetic neuropathy associated with type 2 diabetes mellitus ([place]) 02/12/2015    Type 2 diabetes mellitus with complication, without long-term current use of insulin ([place]) 06/02/2013   Hypertension 06/02/2013   Anemia 12/16/2011   Osteoporosis 11/26/2011   Hyperlipidemia 11/26/2011   PCP: [name], FNP  REFERRING PROVIDER: Gaynelle Arabian, MD   REFERRING DIAG: S/P Left Total Knee Athroplasty   THERAPY DIAG:  Acute pain of left knee  Stiffness of left knee, not elsewhere classified  Localized edema  Rationale for Evaluation and Treatment: Rehabilitation  ONSET DATE: 12/01/22  SUBJECTIVE:   SUBJECTIVE STATEMENT: Reports 6/10 pain  LT knee. To MD  Tuesday  PERTINENT HISTORY: Hypertension, diabetes, osteoporosis, osteoarthritis, and current smoker  PAIN:  Are you having pain? Yes: NPRS scale: 6/10 Pain location: left leg Pain description: sensitive, severe, stabbing Aggravating factors: moving her leg Relieving factors: ice, medication  PRECAUTIONS: None  PATIENT GOALS: reduced pain, play with her grandchild, improved mobility, and walk without a walker  NEXT MD VISIT:   OBJECTIVE:  PATIENT SURVEYS:  FOTO 19.19  EDEMA:  Circumferential: L knee: 44 cm R: 37.5 cm   LOWER EXTREMITY ROM:  Active ROM Right eval Left eval  Left AROM 12/31/22  Hip flexion      Hip extension      Hip abduction      Hip adduction  Hip internal rotation      Hip external rotation      Knee flexion 120 41/47 (PROM)  In supine: Active to 90 degrees.   Knee extension 0 9 -5(PROM) 9  Ankle dorsiflexion      Ankle plantarflexion      Ankle inversion      Ankle eversion       (Blank rows = not tested)  LOWER EXTREMITY MMT: not tested due to surgical condition  TODAY'S TREATMENT:                                                                                                                              DATE:                            EXERCISE LOG 01/02/23:    LT TKR  Exercise Repetitions and Resistance Comments  Nustep  L3, seat 8 x 15  minutes   Rockerboard  X4 min   8 inch box lunges X20 reps flexion stretch   6in box step up X 15 LT knee with UE assist   HS Stretch    Heel prop for extension    Manual PROM for flexion and extension    Ext to -10 degrees and flexion to 94 degrees Modalities  Date:  Unattended Estim: Knee, IFC, 15 mins, Pain Vaso: Knee, Low, 15 mins, Pain  PATIENT EDUCATION:  Education details: Encouraged patient to be compliant to her HEP which she admits she is not doing like she should.    Person educated: Patient Education method: Explanation Education comprehension: verbalized understanding  HOME EXERCISE PROGRAM: Patient did fairly well today.  Did not take pain medication and is not doing her HEP as instructed.  Added standing marching with bilateral UE support.    ASSESSMENT:  CLINICAL IMPRESSION: Patient presented in clinic with increased pain of L knee and did not sleep well last night. Patient very hesistant for any knee PROM due to pain  and has been very cautious due to pain since surgery. Rx focused on increased ROM and strengthening. Encouraged Pt to please work on ROM progression at home. PROM today 10-94 degrees.  01/02/23 PROGRESS REPORT:  Patient continues to make fair progress with skilled physical therapy with left knee pain being her primary limiting factor. Her active and passive range of motion continues to be limited as she has yet to exceed 95 degrees of knee flexion. Recommend that she continue with skilled physical therapy at twice a week for three additional weeks to maximize her function mobility.   Jacqulynn Cadet, PT, DPT   OBJECTIVE IMPAIRMENTS: Abnormal gait, decreased activity tolerance, decreased balance, decreased mobility, difficulty walking, decreased ROM, decreased strength, hypomobility, increased edema, impaired flexibility, impaired tone, and pain.   ACTIVITY LIMITATIONS: carrying, lifting, sitting, standing, squatting, sleeping, stairs, transfers, bed  mobility, bathing, dressing, locomotion level, and caring for others  PARTICIPATION LIMITATIONS:  meal prep, cleaning, laundry, driving, shopping, community activity, and yard work  PERSONAL FACTORS: Transportation and 3+ comorbidities: Hypertension, diabetes, osteoporosis, osteoarthritis, and current smoker  are also affecting patient's functional outcome.   REHAB POTENTIAL: Good  CLINICAL DECISION MAKING: Evolving/moderate complexity  EVALUATION COMPLEXITY: Moderate  GOALS: Goals reviewed with patient? Yes  LONG TERM GOALS: Target date: 01/01/23  Patient will be independent with her HEP. Baseline:  Goal status: MET  2.  Patient will be able to safely ambulate at least 80 feet with a cane or the least restrictive assistive device for improved household mobility. Baseline:  Goal status: MET  3.  Patient will be able to demonstrate active left knee extension within 5 degrees of neutral for improved gait mechanics.  Baseline:2/12: -14 degrees  Goal status: IN PROGRESS  4.  Patient will be able to demonstrate at least 115 degrees of active left knee flexion for improved function navigating stairs. Baseline: 2/12: 97 degrees Goal status: IN PROGRESS  PLAN:  PT FREQUENCY: 2-3x/week  PT DURATION: 4 weeks  PLANNED INTERVENTIONS: Therapeutic exercises, Therapeutic activity, Neuromuscular re-education, Balance training, Gait training, Patient/Family education, Self Care, Joint mobilization, Stair training, Electrical stimulation, Cryotherapy, Moist heat, Vasopneumatic device, Manual therapy, and Re-evaluation  PLAN FOR NEXT SESSION: NuStep, quad sets, heel slides, gastroc and hamstring stretching, manual therapy, and modalities as needed  Tvisha Schwoerer,CHRIS, PTA 01/02/2023, 12:30 PM

## 2023-01-02 NOTE — Addendum Note (Signed)
Addended by: Darlin Coco on: 01/02/2023 12:53 PM   Modules accepted: Orders

## 2023-01-05 ENCOUNTER — Encounter: Payer: Medicare HMO | Admitting: Physical Therapy

## 2023-01-06 DIAGNOSIS — Z5189 Encounter for other specified aftercare: Secondary | ICD-10-CM | POA: Diagnosis not present

## 2023-01-07 ENCOUNTER — Ambulatory Visit: Payer: Medicare HMO

## 2023-01-07 DIAGNOSIS — R600 Localized edema: Secondary | ICD-10-CM

## 2023-01-07 DIAGNOSIS — M25562 Pain in left knee: Secondary | ICD-10-CM

## 2023-01-07 DIAGNOSIS — M25662 Stiffness of left knee, not elsewhere classified: Secondary | ICD-10-CM

## 2023-01-07 NOTE — Therapy (Signed)
OUTPATIENT PHYSICAL THERAPY LOWER EXTREMITY TREATMENT   Patient Name: Lindsay Villarreal MRN: [id number] DOB:10/25/1946, 77 y.o., female Today's Date: 01/07/2023  END OF SESSION:  PT End of Session - 01/07/23 0948     Visit Number 13    Number of Visits 18    Date for PT Re-Evaluation 02/13/23    PT Start Time 0945    PT Stop Time [id number]    PT Time Calculation (min) 59 min            Past Medical History:  Diagnosis Date   Anemia    Po iron   Arthritis    Bilateral cataracts    Essential hypertension    GERD (gastroesophageal reflux disease)    Heart murmur    History of kidney stones    Hyperlipidemia    Neuromuscular disorder ([place])    Osteoporosis    Type 2 diabetes mellitus ([place])    Past Surgical History:  Procedure Laterality Date   APPENDECTOMY  11/17/1976   BLADDER REPAIR     CATARACT EXTRACTION W/ INTRAOCULAR LENS IMPLANT  2010 (left), 2013 (right)   COLONOSCOPY  04/30/2015   Dr.Perry   COLONOSCOPY  2013   EYE SURGERY     POLYPECTOMY     TOTAL KNEE ARTHROPLASTY Left 12/01/2022   Procedure: TOTAL KNEE ARTHROPLASTY;  Surgeon: Gaynelle Arabian, MD;  [facility];  Service: Orthopedics;  Laterality: Left;   TUBAL LIGATION     VAGINAL HYSTERECTOMY  11/17/1976   XI ROBOTIC ASSISTED INGUINAL HERNIA REPAIR WITH MESH Bilateral 02/22/2021   Procedure: XI ROBOTIC ASSISTED BILATERAL INGUINAL HERNIA REPAIR;  Surgeon: Clovis Riley, MD;  [facility];  Service: General;  Laterality: Bilateral;  2.5 HOURS   Patient Active Problem List   Diagnosis Date Noted   OA (osteoarthritis) of knee 12/01/2022   Primary osteoarthritis of left knee 12/01/2022   Inguinal hernia of right side without obstruction or gangrene 07/12/2020   Heart murmur 07/12/2020   Inguinal hernia of left side without obstruction or gangrene 07/12/2020   Gastroesophageal reflux disease without esophagitis 10/27/2016   Diabetic neuropathy associated with type 2 diabetes mellitus ([place]) 02/12/2015    Type 2 diabetes mellitus with complication, without long-term current use of insulin ([place]) 06/02/2013   Hypertension 06/02/2013   Anemia 12/16/2011   Osteoporosis 11/26/2011   Hyperlipidemia 11/26/2011   PCP: [name], FNP  REFERRING PROVIDER: Gaynelle Arabian, MD   REFERRING DIAG: S/P Left Total Knee Athroplasty   THERAPY DIAG:  Acute pain of left knee  Stiffness of left knee, not elsewhere classified  Localized edema  Rationale for Evaluation and Treatment: Rehabilitation  ONSET DATE: 12/01/22  SUBJECTIVE:   SUBJECTIVE STATEMENT: Pt reports that MD is pleased with everything at this point.  Pt does have more pain today from ride to [facility] yesterday.  Did not sleep very well last night.  PERTINENT HISTORY: Hypertension, diabetes, osteoporosis, osteoarthritis, and current smoker  PAIN:  Are you having pain? Yes: NPRS scale: 6/10 Pain location: left leg Pain description: sensitive, severe, stabbing Aggravating factors: moving her leg Relieving factors: ice, medication  PRECAUTIONS: None  PATIENT GOALS: reduced pain, play with her grandchild, improved mobility, and walk without a walker  NEXT MD VISIT:   OBJECTIVE:  PATIENT SURVEYS:  FOTO 19.19  EDEMA:  Circumferential: L knee: 44 cm R: 37.5 cm   LOWER EXTREMITY ROM:  Active ROM Right eval Left eval  Left AROM 12/31/22  Hip flexion  Hip extension      Hip abduction      Hip adduction      Hip internal rotation      Hip external rotation      Knee flexion 120 41/47 (PROM)  In supine: Active to 90 degrees.   Knee extension 0 9 -5(PROM) 9  Ankle dorsiflexion      Ankle plantarflexion      Ankle inversion      Ankle eversion       (Blank rows = not tested)  LOWER EXTREMITY MMT: not tested due to surgical condition  TODAY'S TREATMENT:                                                                                                                              DATE:                             EXERCISE LOG 01/07/23:    LT TKR  Exercise Repetitions and Resistance Comments  Nustep  L3, seat 8 x 15 minutes   Rockerboard  X4 min   8 inch box lunges X20 reps flexion stretch   6in box step up X 20 LT knee with UE assist   HS Stretch    Heel prop for extension     Manual Therapy Soft Tissue Mobilization: left hamstring, STW/M to left hamstring with pt in right side-lying to decrease pain and tone    Modalities  Date:  Unattended Estim: Knee, IFC, 15 mins, Pain Vaso: Knee, Low, 15 mins, Pain  PATIENT EDUCATION:  Education details: Encouraged patient to be compliant to her HEP which she admits she is not doing like she should.    Person educated: Patient Education method: Explanation Education comprehension: verbalized understanding  HOME EXERCISE PROGRAM: Patient did fairly well today.  Did not take pain medication and is not doing her HEP as instructed.  Added standing marching with bilateral UE support.    ASSESSMENT:  CLINICAL IMPRESSION: Pt arrives for today's treatment session reporting 8/10 left knee pain.  Pt reports MD is happy with progress at this time and she does not have to go back for five weeks.  STW/M initiated today to decrease pain and tone to left hamstring and calf with good result.  Normal responses to estim and vaso noted upon removal.  Pt reported 4/10 left knee pain at completion of today's treatment session.  OBJECTIVE IMPAIRMENTS: Abnormal gait, decreased activity tolerance, decreased balance, decreased mobility, difficulty walking, decreased ROM, decreased strength, hypomobility, increased edema, impaired flexibility, impaired tone, and pain.   ACTIVITY LIMITATIONS: carrying, lifting, sitting, standing, squatting, sleeping, stairs, transfers, bed mobility, bathing, dressing, locomotion level, and caring for others  PARTICIPATION LIMITATIONS: meal prep, cleaning, laundry, driving, shopping, community activity, and yard work  PERSONAL  FACTORS: Transportation and 3+ comorbidities: Hypertension, diabetes, osteoporosis, osteoarthritis, and current smoker  are also affecting patient's functional outcome.  REHAB POTENTIAL: Good  CLINICAL DECISION MAKING: Evolving/moderate complexity  EVALUATION COMPLEXITY: Moderate  GOALS: Goals reviewed with patient? Yes  LONG TERM GOALS: Target date: 01/01/23  Patient will be independent with her HEP. Baseline:  Goal status: MET  2.  Patient will be able to safely ambulate at least 80 feet with a cane or the least restrictive assistive device for improved household mobility. Baseline:  Goal status: MET  3.  Patient will be able to demonstrate active left knee extension within 5 degrees of neutral for improved gait mechanics.  Baseline:2/12: -14 degrees  Goal status: IN PROGRESS  4.  Patient will be able to demonstrate at least 115 degrees of active left knee flexion for improved function navigating stairs. Baseline: 2/12: 97 degrees Goal status: IN PROGRESS  PLAN:  PT FREQUENCY: 2-3x/week  PT DURATION: 4 weeks  PLANNED INTERVENTIONS: Therapeutic exercises, Therapeutic activity, Neuromuscular re-education, Balance training, Gait training, Patient/Family education, Self Care, Joint mobilization, Stair training, Electrical stimulation, Cryotherapy, Moist heat, Vasopneumatic device, Manual therapy, and Re-evaluation  PLAN FOR NEXT SESSION: NuStep, quad sets, heel slides, gastroc and hamstring stretching, manual therapy, and modalities as needed  Kathrynn Ducking, PTA 01/07/2023, 10:54 AM

## 2023-01-09 ENCOUNTER — Ambulatory Visit: Payer: Medicare HMO | Admitting: Physical Therapy

## 2023-01-09 ENCOUNTER — Encounter: Payer: Self-pay | Admitting: Physical Therapy

## 2023-01-09 DIAGNOSIS — M25662 Stiffness of left knee, not elsewhere classified: Secondary | ICD-10-CM

## 2023-01-09 DIAGNOSIS — R600 Localized edema: Secondary | ICD-10-CM

## 2023-01-09 DIAGNOSIS — M25562 Pain in left knee: Secondary | ICD-10-CM | POA: Diagnosis not present

## 2023-01-09 NOTE — Therapy (Signed)
OUTPATIENT PHYSICAL THERAPY LOWER EXTREMITY TREATMENT   Patient Name: Lindsay Villarreal MRN: [id number] DOB:08/13/1946, 77 y.o., female Today's Date: 01/09/2023  END OF SESSION:  PT End of Session - 01/09/23 0956     Visit Number 14    Number of Visits 18    Date for PT Re-Evaluation 02/13/23    PT Start Time [id number]    PT Stop Time [id number]    PT Time Calculation (min) 42 min    Activity Tolerance Patient limited by pain    Behavior During Therapy [facility] for tasks assessed/performed            Past Medical History:  Diagnosis Date   Anemia    Po iron   Arthritis    Bilateral cataracts    Essential hypertension    GERD (gastroesophageal reflux disease)    Heart murmur    History of kidney stones    Hyperlipidemia    Neuromuscular disorder ([place])    Osteoporosis    Type 2 diabetes mellitus ([place])    Past Surgical History:  Procedure Laterality Date   APPENDECTOMY  11/17/1976   BLADDER REPAIR     CATARACT EXTRACTION W/ INTRAOCULAR LENS IMPLANT  2010 (left), 2013 (right)   COLONOSCOPY  04/30/2015   Dr.Perry   COLONOSCOPY  2013   EYE SURGERY     POLYPECTOMY     TOTAL KNEE ARTHROPLASTY Left 12/01/2022   Procedure: TOTAL KNEE ARTHROPLASTY;  Surgeon: Gaynelle Arabian, MD;  Location: [facility];  Service: Orthopedics;  Laterality: Left;   TUBAL LIGATION     VAGINAL HYSTERECTOMY  11/17/1976   XI ROBOTIC ASSISTED INGUINAL HERNIA REPAIR WITH MESH Bilateral 02/22/2021   Procedure: XI ROBOTIC ASSISTED BILATERAL INGUINAL HERNIA REPAIR;  Surgeon: Clovis Riley, MD;  Location: [facility];  Service: General;  Laterality: Bilateral;  2.5 HOURS   Patient Active Problem List   Diagnosis Date Noted   OA (osteoarthritis) of knee 12/01/2022   Primary osteoarthritis of left knee 12/01/2022   Inguinal hernia of right side without obstruction or gangrene 07/12/2020   Heart murmur 07/12/2020   Inguinal hernia of left side without obstruction or gangrene 07/12/2020   Gastroesophageal reflux disease  without esophagitis 10/27/2016   Diabetic neuropathy associated with type 2 diabetes mellitus ([place]) 02/12/2015   Type 2 diabetes mellitus with complication, without long-term current use of insulin ([place]) 06/02/2013   Hypertension 06/02/2013   Anemia 12/16/2011   Osteoporosis 11/26/2011   Hyperlipidemia 11/26/2011   PCP: [name], FNP  REFERRING PROVIDER: Gaynelle Arabian, MD   REFERRING DIAG: S/P Left Total Knee Athroplasty   THERAPY DIAG:  Acute pain of left knee  Stiffness of left knee, not elsewhere classified  Localized edema  Rationale for Evaluation and Treatment: Rehabilitation  ONSET DATE: 12/01/22  SUBJECTIVE:   SUBJECTIVE STATEMENT: Reports her leg doesn't hurt as bad today.   PERTINENT HISTORY: Hypertension, diabetes, osteoporosis, osteoarthritis, and current smoker  PAIN:  Are you having pain? Yes: NPRS scale: 5-6/10 Pain location: left leg Pain description: sensitive, severe, stabbing Aggravating factors: moving her leg Relieving factors: ice, medication  PRECAUTIONS: None  PATIENT GOALS: reduced pain, play with her grandchild, improved mobility, and walk without a walker  NEXT MD VISIT: 02/11/2023  OBJECTIVE:  PATIENT SURVEYS:  FOTO 19.19  EDEMA:  Circumferential: L knee: 44 cm R: 37.5 cm   LOWER EXTREMITY ROM:  Active ROM Right eval Left eval  Left AROM 12/31/22  Hip flexion      Hip extension  Hip abduction      Hip adduction      Hip internal rotation      Hip external rotation      Knee flexion 120 41/47 (PROM)  In supine: Active to 90 degrees.   Knee extension 0 9 -5(PROM) 9  Ankle dorsiflexion      Ankle plantarflexion      Ankle inversion      Ankle eversion       (Blank rows = not tested)  LOWER EXTREMITY MMT: not tested due to surgical condition  TODAY'S TREATMENT:                                                                                                                              DATE:                             EXERCISE LOG 01/09/23:    LT TKR  Exercise Repetitions and Resistance Comments  Nustep  L3, seat 7 x 15 minutes   Rockerboard  X2 min   8 inch box lunges X20 reps flexion stretch   L runners stretch 10 x5 sec holds   L step down 4" step x15 reps Instructed for min UE support  LAQ 4# x20 reps    Modalities  Date:  Unattended Estim: Knee, IFC, 10 mins, Pain Vaso: Knee, Low, 10 mins, Pain  PATIENT EDUCATION:  Education details: Encouraged patient to be compliant to her HEP which she admits she is not doing like she should.    Person educated: Patient Education method: Explanation Education comprehension: verbalized understanding  HOME EXERCISE PROGRAM: Patient did fairly well today.  Did not take pain medication and is not doing her HEP as instructed.  Added standing marching with bilateral UE support.    ASSESSMENT:  CLINICAL IMPRESSION: Patient reports she is getting more comfortable before going to bed but still cannot sleep which is typical for her. Patient self limiting with LAQ and runners stretches today due to discomfort. Patient reports she does have four stairs at home but has a handrail with them. Has started using a heating pad at home with a massage element in it. Normal modalities response noted following removal of the modalities.  OBJECTIVE IMPAIRMENTS: Abnormal gait, decreased activity tolerance, decreased balance, decreased mobility, difficulty walking, decreased ROM, decreased strength, hypomobility, increased edema, impaired flexibility, impaired tone, and pain.   ACTIVITY LIMITATIONS: carrying, lifting, sitting, standing, squatting, sleeping, stairs, transfers, bed mobility, bathing, dressing, locomotion level, and caring for others  PARTICIPATION LIMITATIONS: meal prep, cleaning, laundry, driving, shopping, community activity, and yard work  PERSONAL FACTORS: Transportation and 3+ comorbidities: Hypertension, diabetes, osteoporosis, osteoarthritis,  and current smoker  are also affecting patient's functional outcome.   REHAB POTENTIAL: Good  CLINICAL DECISION MAKING: Evolving/moderate complexity  EVALUATION COMPLEXITY: Moderate  GOALS: Goals reviewed with patient? Yes  LONG TERM GOALS: Target date: 01/01/23  Patient  will be independent with her HEP. Baseline:  Goal status: MET  2.  Patient will be able to safely ambulate at least 80 feet with a cane or the least restrictive assistive device for improved household mobility. Baseline:  Goal status: MET  3.  Patient will be able to demonstrate active left knee extension within 5 degrees of neutral for improved gait mechanics.  Baseline:2/12: -14 degrees  Goal status: IN PROGRESS  4.  Patient will be able to demonstrate at least 115 degrees of active left knee flexion for improved function navigating stairs. Baseline: 2/12: 97 degrees Goal status: IN PROGRESS  PLAN:  PT FREQUENCY: 2-3x/week  PT DURATION: 4 weeks  PLANNED INTERVENTIONS: Therapeutic exercises, Therapeutic activity, Neuromuscular re-education, Balance training, Gait training, Patient/Family education, Self Care, Joint mobilization, Stair training, Electrical stimulation, Cryotherapy, Moist heat, Vasopneumatic device, Manual therapy, and Re-evaluation  PLAN FOR NEXT SESSION: NuStep, quad sets, heel slides, gastroc and hamstring stretching, manual therapy, and modalities as needed  Standley Brooking, PTA 01/09/2023, 10:31 AM

## 2023-01-12 ENCOUNTER — Ambulatory Visit: Payer: Medicare HMO

## 2023-01-12 DIAGNOSIS — R600 Localized edema: Secondary | ICD-10-CM

## 2023-01-12 DIAGNOSIS — M25662 Stiffness of left knee, not elsewhere classified: Secondary | ICD-10-CM | POA: Diagnosis not present

## 2023-01-12 DIAGNOSIS — M25562 Pain in left knee: Secondary | ICD-10-CM

## 2023-01-12 NOTE — Therapy (Signed)
OUTPATIENT PHYSICAL THERAPY LOWER EXTREMITY TREATMENT   Patient Name: Lindsay Villarreal MRN: [id number] DOB:[birth year], 77 y.o., female Today's Date: 01/12/2023  END OF SESSION:  PT End of Session - 01/12/23 1117     Visit Number 15    Number of Visits 18    Date for PT Re-Evaluation 02/13/23    PT Start Time 1115    PT Stop Time [id number]    PT Time Calculation (min) 58 min    Activity Tolerance Patient limited by pain    Behavior During Therapy [facility] for tasks assessed/performed            Past Medical History:  Diagnosis Date   Anemia    Po iron   Arthritis    Bilateral cataracts    Essential hypertension    GERD (gastroesophageal reflux disease)    Heart murmur    History of kidney stones    Hyperlipidemia    Neuromuscular disorder ([place])    Osteoporosis    Type 2 diabetes mellitus ([place])    Past Surgical History:  Procedure Laterality Date   APPENDECTOMY  11/17/1976   BLADDER REPAIR     CATARACT EXTRACTION W/ INTRAOCULAR LENS IMPLANT  2010 (left), 2013 (right)   COLONOSCOPY  04/30/2015   Dr.Perry   COLONOSCOPY  2013   EYE SURGERY     POLYPECTOMY     TOTAL KNEE ARTHROPLASTY Left 12/01/2022   Procedure: TOTAL KNEE ARTHROPLASTY;  Surgeon: Gaynelle Arabian, MD;  [facility];  Service: Orthopedics;  Laterality: Left;   TUBAL LIGATION     VAGINAL HYSTERECTOMY  11/17/1976   XI ROBOTIC ASSISTED INGUINAL HERNIA REPAIR WITH MESH Bilateral 02/22/2021   Procedure: XI ROBOTIC ASSISTED BILATERAL INGUINAL HERNIA REPAIR;  Surgeon: Clovis Riley, MD;  [facility];  Service: General;  Laterality: Bilateral;  2.5 HOURS   Patient Active Problem List   Diagnosis Date Noted   OA (osteoarthritis) of knee 12/01/2022   Primary osteoarthritis of left knee 12/01/2022   Inguinal hernia of right side without obstruction or gangrene 07/12/2020   Heart murmur 07/12/2020   Inguinal hernia of left side without obstruction or gangrene 07/12/2020   Gastroesophageal reflux disease  without esophagitis 10/27/2016   Diabetic neuropathy associated with type 2 diabetes mellitus ([place]) 02/12/2015   Type 2 diabetes mellitus with complication, without long-term current use of insulin ([place]) 06/02/2013   Hypertension 06/02/2013   Anemia 12/16/2011   Osteoporosis 11/26/2011   Hyperlipidemia 11/26/2011   PCP: [name], FNP  REFERRING PROVIDER: Gaynelle Arabian, MD   REFERRING DIAG: S/P Left Total Knee Athroplasty   THERAPY DIAG:  Acute pain of left knee  Stiffness of left knee, not elsewhere classified  Localized edema  Rationale for Evaluation and Treatment: Rehabilitation  ONSET DATE: 12/01/22  SUBJECTIVE:   SUBJECTIVE STATEMENT: Reports her leg doesn't hurt as bad today.  Pt states that Saturday was a really bad day for her knee and kept ice on it throughout the day.  PERTINENT HISTORY: Hypertension, diabetes, osteoporosis, osteoarthritis, and current smoker  PAIN:  Are you having pain? Yes: NPRS scale: 5/10 Pain location: left leg Pain description: sensitive, severe, stabbing Aggravating factors: moving her leg Relieving factors: ice, medication  PRECAUTIONS: None  PATIENT GOALS: reduced pain, play with her grandchild, improved mobility, and walk without a walker  NEXT MD VISIT: 02/11/2023  OBJECTIVE:  PATIENT SURVEYS:  FOTO 19.19  EDEMA:  Circumferential: L knee: 44 cm R: 37.5 cm   LOWER EXTREMITY ROM:  Active ROM Right eval Left eval  Left AROM 12/31/22  Hip flexion      Hip extension      Hip abduction      Hip adduction      Hip internal rotation      Hip external rotation      Knee flexion 120 41/47 (PROM)  In supine: Active to 90 degrees.   Knee extension 0 9 -5(PROM) 9  Ankle dorsiflexion      Ankle plantarflexion      Ankle inversion      Ankle eversion       (Blank rows = not tested)  LOWER EXTREMITY MMT: not tested due to surgical condition  TODAY'S TREATMENT:                                                                                                                               DATE:                            EXERCISE LOG 01/12/23:    LT TKR  Exercise Repetitions and Resistance Comments  Nustep  L3, seat 8-7 x 15 minutes   Rockerboard  X3 min   8 inch box lunges X2 mins   L runners stretch 10 x5 sec holds   L step down  Instructed for min UE support  LAQ     Manual Therapy Soft Tissue Mobilization: Left hamstring and calf, STW/M to left hamstring and calf to decrease pain and tone and assist with increasing ROM and function     Modalities  Date:  Unattended Estim: Knee, IFC, 15 mins, Pain Vaso: Knee, Low, 15 mins, Pain  PATIENT EDUCATION:  Education details: Encouraged patient to be compliant to her HEP which she admits she is not doing like she should.    Person educated: Patient Education method: Explanation Education comprehension: verbalized understanding  HOME EXERCISE PROGRAM: Patient did fairly well today.  Did not take pain medication and is not doing her HEP as instructed.  Added standing marching with bilateral UE support.    ASSESSMENT:  CLINICAL IMPRESSION: Pt arrives for today's treatment session reporting 5/10 left knee pain.  Pt reports that she had a bad day on Saturday, but yesterday was much better.  Pt challenged by runner's stretch due to pain and discomfort.  STW/M performed to left hamstring, calf, and IT band to decrease pain and tone and assist with increasing ROM.  Normal responses to estim and vaso noted upon removal.  Pt reported 3/10 left knee pain at completion of today's treatment session.   OBJECTIVE IMPAIRMENTS: Abnormal gait, decreased activity tolerance, decreased balance, decreased mobility, difficulty walking, decreased ROM, decreased strength, hypomobility, increased edema, impaired flexibility, impaired tone, and pain.   ACTIVITY LIMITATIONS: carrying, lifting, sitting, standing, squatting, sleeping, stairs, transfers, bed mobility, bathing,  dressing, locomotion level, and caring for others  PARTICIPATION LIMITATIONS: meal prep,  cleaning, laundry, driving, shopping, community activity, and yard work  PERSONAL FACTORS: Transportation and 3+ comorbidities: Hypertension, diabetes, osteoporosis, osteoarthritis, and current smoker  are also affecting patient's functional outcome.   REHAB POTENTIAL: Good  CLINICAL DECISION MAKING: Evolving/moderate complexity  EVALUATION COMPLEXITY: Moderate  GOALS: Goals reviewed with patient? Yes  LONG TERM GOALS: Target date: 01/01/23  Patient will be independent with her HEP. Baseline:  Goal status: MET  2.  Patient will be able to safely ambulate at least 80 feet with a cane or the least restrictive assistive device for improved household mobility. Baseline:  Goal status: MET  3.  Patient will be able to demonstrate active left knee extension within 5 degrees of neutral for improved gait mechanics.  Baseline:2/12: -14 degrees  Goal status: IN PROGRESS  4.  Patient will be able to demonstrate at least 115 degrees of active left knee flexion for improved function navigating stairs. Baseline: 2/12: 97 degrees Goal status: IN PROGRESS  PLAN:  PT FREQUENCY: 2-3x/week  PT DURATION: 4 weeks  PLANNED INTERVENTIONS: Therapeutic exercises, Therapeutic activity, Neuromuscular re-education, Balance training, Gait training, Patient/Family education, Self Care, Joint mobilization, Stair training, Electrical stimulation, Cryotherapy, Moist heat, Vasopneumatic device, Manual therapy, and Re-evaluation  PLAN FOR NEXT SESSION: NuStep, quad sets, heel slides, gastroc and hamstring stretching, manual therapy, and modalities as needed  Kathrynn Ducking, PTA 01/12/2023, 12:17 PM

## 2023-01-15 ENCOUNTER — Ambulatory Visit: Payer: Medicare HMO

## 2023-01-15 DIAGNOSIS — R600 Localized edema: Secondary | ICD-10-CM | POA: Diagnosis not present

## 2023-01-15 DIAGNOSIS — M25562 Pain in left knee: Secondary | ICD-10-CM | POA: Diagnosis not present

## 2023-01-15 DIAGNOSIS — M25662 Stiffness of left knee, not elsewhere classified: Secondary | ICD-10-CM | POA: Diagnosis not present

## 2023-01-15 NOTE — Therapy (Signed)
OUTPATIENT PHYSICAL THERAPY LOWER EXTREMITY TREATMENT   Patient Name: Lindsay Villarreal MRN: [id number] DOB:April 27, 1946, 77 y.o., female Today's Date: 01/15/2023  END OF SESSION:  PT End of Session - 01/15/23 0948     Visit Number 16    Number of Visits 18    Date for PT Re-Evaluation 02/13/23    PT Start Time 0945    PT Stop Time [id number]    PT Time Calculation (min) 60 min    Activity Tolerance Patient limited by pain    Behavior During Therapy [facility] for tasks assessed/performed            Past Medical History:  Diagnosis Date   Anemia    Po iron   Arthritis    Bilateral cataracts    Essential hypertension    GERD (gastroesophageal reflux disease)    Heart murmur    History of kidney stones    Hyperlipidemia    Neuromuscular disorder ([place])    Osteoporosis    Type 2 diabetes mellitus ([place])    Past Surgical History:  Procedure Laterality Date   APPENDECTOMY  11/17/1976   BLADDER REPAIR     CATARACT EXTRACTION W/ INTRAOCULAR LENS IMPLANT  2010 (left), 2013 (right)   COLONOSCOPY  04/30/2015   Dr.Perry   COLONOSCOPY  2013   EYE SURGERY     POLYPECTOMY     TOTAL KNEE ARTHROPLASTY Left 12/01/2022   Procedure: TOTAL KNEE ARTHROPLASTY;  Surgeon: Gaynelle Arabian, MD;  [facility];  Service: Orthopedics;  Laterality: Left;   TUBAL LIGATION     VAGINAL HYSTERECTOMY  11/17/1976   XI ROBOTIC ASSISTED INGUINAL HERNIA REPAIR WITH MESH Bilateral 02/22/2021   Procedure: XI ROBOTIC ASSISTED BILATERAL INGUINAL HERNIA REPAIR;  Surgeon: Clovis Riley, MD;  Location[facility];  Service: General;  Laterality: Bilateral;  2.5 HOURS   Patient Active Problem List   Diagnosis Date Noted   OA (osteoarthritis) of knee 12/01/2022   Primary osteoarthritis of left knee 12/01/2022   Inguinal hernia of right side without obstruction or gangrene 07/12/2020   Heart murmur 07/12/2020   Inguinal hernia of left side without obstruction or gangrene 07/12/2020   Gastroesophageal reflux disease  without esophagitis 10/27/2016   Diabetic neuropathy associated with type 2 diabetes mellitus ([place]) 02/12/2015   Type 2 diabetes mellitus with complication, without long-term current use of insulin ([place]) 06/02/2013   Hypertension 06/02/2013   Anemia 12/16/2011   Osteoporosis 11/26/2011   Hyperlipidemia 11/26/2011   PCP: [name], FNP  REFERRING PROVIDER: Gaynelle Arabian, MD   REFERRING DIAG: S/P Left Total Knee Athroplasty   THERAPY DIAG:  Acute pain of left knee  Stiffness of left knee, not elsewhere classified  Localized edema  Rationale for Evaluation and Treatment: Rehabilitation  ONSET DATE: 12/01/22  SUBJECTIVE:   SUBJECTIVE STATEMENT: Reports her leg doesn't hurt as bad today.    PERTINENT HISTORY: Hypertension, diabetes, osteoporosis, osteoarthritis, and current smoker  PAIN:  Are you having pain? Yes: NPRS scale: 4/10 Pain location: left leg Pain description: sensitive, severe, stabbing Aggravating factors: moving her leg Relieving factors: ice, medication  PRECAUTIONS: None  PATIENT GOALS: reduced pain, play with her grandchild, improved mobility, and walk without a walker  NEXT MD VISIT: 02/11/2023  OBJECTIVE:  PATIENT SURVEYS:  FOTO 19.19  EDEMA:  Circumferential: L knee: 44 cm R: 37.5 cm   LOWER EXTREMITY ROM:  Active ROM Right eval Left eval  Left AROM 12/31/22  Hip flexion      Hip  extension      Hip abduction      Hip adduction      Hip internal rotation      Hip external rotation      Knee flexion 120 41/47 (PROM)  In supine: Active to 90 degrees.   Knee extension 0 9 -5(PROM) 9  Ankle dorsiflexion      Ankle plantarflexion      Ankle inversion      Ankle eversion       (Blank rows = not tested)  LOWER EXTREMITY MMT: not tested due to surgical condition  TODAY'S TREATMENT:                                                                                                                              DATE:                       EXERCISE LOG 01/15/23:LT TKR  Exercise Repetitions and Resistance Comments  Nustep  L4, seat 8-7 x 15 minutes   Rockerboard  X4 min   8 inch box lunges X2 mins   L runners stretch    L step down  Instructed for min UE support  LAQ     Manual Therapy Soft Tissue Mobilization: Left hamstring and calf, STW/M to left hamstring and calf to decrease pain and tone and assist with increasing ROM and function     Modalities  Date:  Unattended Estim: Knee, IFC, 15 mins, Pain Vaso: Knee, Low, 15 mins, Pain  PATIENT EDUCATION:  Education details: Encouraged patient to be compliant to her HEP which she admits she is not doing like she should.    Person educated: Patient Education method: Explanation Education comprehension: verbalized understanding  HOME EXERCISE PROGRAM: Patient did fairly well today.  Did not take pain medication and is not doing her HEP as instructed.  Added standing marching with bilateral UE support.    ASSESSMENT:  CLINICAL IMPRESSION: Pt arrives for today's treatment session reporting 4/10 left knee pain.  Pt able to tolerate increased time on lunges today and encouraged to push her knee as far as she could tolerate.  STW/M performed to left quad, IT band, hamstring, and gastroc to decrease pain and tone.  Normal responses to estim and vaso noted upon removal.  Pt reported 3/10 left knee pain at completion of today's treatment session.   OBJECTIVE IMPAIRMENTS: Abnormal gait, decreased activity tolerance, decreased balance, decreased mobility, difficulty walking, decreased ROM, decreased strength, hypomobility, increased edema, impaired flexibility, impaired tone, and pain.   ACTIVITY LIMITATIONS: carrying, lifting, sitting, standing, squatting, sleeping, stairs, transfers, bed mobility, bathing, dressing, locomotion level, and caring for others  PARTICIPATION LIMITATIONS: meal prep, cleaning, laundry, driving, shopping, community activity, and yard  work  PERSONAL FACTORS: Transportation and 3+ comorbidities: Hypertension, diabetes, osteoporosis, osteoarthritis, and current smoker  are also affecting patient's functional outcome.   REHAB POTENTIAL: Good  CLINICAL DECISION MAKING: Evolving/moderate complexity  EVALUATION COMPLEXITY: Moderate  GOALS: Goals reviewed with patient? Yes  LONG TERM GOALS: Target date: 01/01/23  Patient will be independent with her HEP. Baseline:  Goal status: MET  2.  Patient will be able to safely ambulate at least 80 feet with a cane or the least restrictive assistive device for improved household mobility. Baseline:  Goal status: MET  3.  Patient will be able to demonstrate active left knee extension within 5 degrees of neutral for improved gait mechanics.  Baseline:2/12: -14 degrees  Goal status: IN PROGRESS  4.  Patient will be able to demonstrate at least 115 degrees of active left knee flexion for improved function navigating stairs. Baseline: 2/12: 97 degrees Goal status: IN PROGRESS  PLAN:  PT FREQUENCY: 2-3x/week  PT DURATION: 4 weeks  PLANNED INTERVENTIONS: Therapeutic exercises, Therapeutic activity, Neuromuscular re-education, Balance training, Gait training, Patient/Family education, Self Care, Joint mobilization, Stair training, Electrical stimulation, Cryotherapy, Moist heat, Vasopneumatic device, Manual therapy, and Re-evaluation  PLAN FOR NEXT SESSION: NuStep, quad sets, heel slides, gastroc and hamstring stretching, manual therapy, and modalities as needed  Kathrynn Ducking, PTA 01/15/2023, 10:45 AM

## 2023-01-19 ENCOUNTER — Ambulatory Visit: Payer: Medicare HMO | Attending: Orthopedic Surgery

## 2023-01-19 DIAGNOSIS — M25662 Stiffness of left knee, not elsewhere classified: Secondary | ICD-10-CM | POA: Diagnosis not present

## 2023-01-19 DIAGNOSIS — M25562 Pain in left knee: Secondary | ICD-10-CM | POA: Diagnosis not present

## 2023-01-19 DIAGNOSIS — R600 Localized edema: Secondary | ICD-10-CM | POA: Insufficient documentation

## 2023-01-19 NOTE — Therapy (Signed)
OUTPATIENT PHYSICAL THERAPY LOWER EXTREMITY TREATMENT   Patient Name: Lindsay Villarreal MRN: [id number] DOB:02/16/1946, 77 y.o., female Today's Date: 01/19/2023  END OF SESSION:  PT End of Session - 01/19/23 0949     Visit Number 17    Number of Visits 18    Date for PT Re-Evaluation 02/13/23    PT Start Time 0945    Activity Tolerance Patient limited by pain    Behavior During Therapy [facility] for tasks assessed/performed            Past Medical History:  Diagnosis Date   Anemia    Po iron   Arthritis    Bilateral cataracts    Essential hypertension    GERD (gastroesophageal reflux disease)    Heart murmur    History of kidney stones    Hyperlipidemia    Neuromuscular disorder ([place])    Osteoporosis    Type 2 diabetes mellitus ([place])    Past Surgical History:  Procedure Laterality Date   APPENDECTOMY  11/17/1976   BLADDER REPAIR     CATARACT EXTRACTION W/ INTRAOCULAR LENS IMPLANT  2010 (left), 2013 (right)   COLONOSCOPY  04/30/2015   Dr.Perry   COLONOSCOPY  2013   EYE SURGERY     POLYPECTOMY     TOTAL KNEE ARTHROPLASTY Left 12/01/2022   Procedure: TOTAL KNEE ARTHROPLASTY;  Surgeon: Gaynelle Arabian, MD;  Location: [facility];  Service: Orthopedics;  Laterality: Left;   TUBAL LIGATION     VAGINAL HYSTERECTOMY  11/17/1976   XI ROBOTIC ASSISTED INGUINAL HERNIA REPAIR WITH MESH Bilateral 02/22/2021   Procedure: XI ROBOTIC ASSISTED BILATERAL INGUINAL HERNIA REPAIR;  Surgeon: Clovis Riley, MD;  Location: [facility];  Service: General;  Laterality: Bilateral;  2.5 HOURS   Patient Active Problem List   Diagnosis Date Noted   OA (osteoarthritis) of knee 12/01/2022   Primary osteoarthritis of left knee 12/01/2022   Inguinal hernia of right side without obstruction or gangrene 07/12/2020   Heart murmur 07/12/2020   Inguinal hernia of left side without obstruction or gangrene 07/12/2020   Gastroesophageal reflux disease without esophagitis 10/27/2016   Diabetic neuropathy associated  with type 2 diabetes mellitus ([place]) 02/12/2015   Type 2 diabetes mellitus with complication, without long-term current use of insulin ([place]) 06/02/2013   Hypertension 06/02/2013   Anemia 12/16/2011   Osteoporosis 11/26/2011   Hyperlipidemia 11/26/2011   PCP: [name], FNP  REFERRING PROVIDER: Gaynelle Arabian, MD   REFERRING DIAG: S/P Left Total Knee Athroplasty   THERAPY DIAG:  Acute pain of left knee  Stiffness of left knee, not elsewhere classified  Localized edema  Rationale for Evaluation and Treatment: Rehabilitation  ONSET DATE: 12/01/22  SUBJECTIVE:   SUBJECTIVE STATEMENT: Reports her leg doesn't hurt as bad today.  Pt reports working on stretches over the weekend, but is concerned that her leg with not straighten completely.  PERTINENT HISTORY: Hypertension, diabetes, osteoporosis, osteoarthritis, and current smoker  PAIN:  Are you having pain? Yes: NPRS scale: 4/10 Pain location: left leg Pain description: sensitive, severe, stabbing Aggravating factors: moving her leg Relieving factors: ice, medication  PRECAUTIONS: None  PATIENT GOALS: reduced pain, play with her grandchild, improved mobility, and walk without a walker  NEXT MD VISIT: 02/11/2023  OBJECTIVE:  PATIENT SURVEYS:  FOTO 19.19  EDEMA:  Circumferential: L knee: 44 cm R: 37.5 cm   LOWER EXTREMITY ROM:  Active ROM Right eval Left eval  Left AROM 12/31/22  Hip flexion  Hip extension      Hip abduction      Hip adduction      Hip internal rotation      Hip external rotation      Knee flexion 120 41/47 (PROM)  In supine: Active to 90 degrees.   Knee extension 0 9 -5(PROM) 9  Ankle dorsiflexion      Ankle plantarflexion      Ankle inversion      Ankle eversion       (Blank rows = not tested)  LOWER EXTREMITY MMT: not tested due to surgical condition  TODAY'S TREATMENT:                                                                                                                               DATE:                      EXERCISE LOG 01/15/23:LT TKR  Exercise Repetitions and Resistance Comments  Nustep  L4, seat 7-6 x 15 minutes   Rockerboard  x4 min   8 inch box lunges x3 mins   Extension stretch x3 mins w/ zero knee   L runners stretch    L step down  Instructed for min UE support  LAQ     Manual Therapy Soft Tissue Mobilization: Left hamstring and calf, STW/M to left hamstring and calf to decrease pain and tone and assist with increasing ROM and function     Modalities  Date:  Unattended Estim: Knee, IFC, 15 mins, Pain Vaso: Knee, Low, 15 mins, Pain  PATIENT EDUCATION:  Education details: Encouraged patient to be compliant to her HEP which she admits she is not doing like she should.    Person educated: Patient Education method: Explanation Education comprehension: verbalized understanding  HOME EXERCISE PROGRAM: Patient did fairly well today.  Did not take pain medication and is not doing her HEP as instructed.  Added standing marching with bilateral UE support.    ASSESSMENT:  CLINICAL IMPRESSION: Pt arrives for today's treatment session reporting 4/10 left knee pain.  Pt introduced to to supine extension stretch with use of zero knee.  Pt challenged by exercises, but able to perform to full extent.  Pt instructed to add exercise to HEP.  STW/M performed to left quad, IT band, and hamstring to decrease pain and tone.  Normal responses to estim and vaso noted upon removal.  Pt reported 4/10 left knee pain at completion of today's treatment session.  OBJECTIVE IMPAIRMENTS: Abnormal gait, decreased activity tolerance, decreased balance, decreased mobility, difficulty walking, decreased ROM, decreased strength, hypomobility, increased edema, impaired flexibility, impaired tone, and pain.   ACTIVITY LIMITATIONS: carrying, lifting, sitting, standing, squatting, sleeping, stairs, transfers, bed mobility, bathing, dressing, locomotion level, and  caring for others  PARTICIPATION LIMITATIONS: meal prep, cleaning, laundry, driving, shopping, community activity, and yard work  PERSONAL FACTORS: Transportation and 3+ comorbidities: Hypertension, diabetes, osteoporosis, osteoarthritis, and current  smoker  are also affecting patient's functional outcome.   REHAB POTENTIAL: Good  CLINICAL DECISION MAKING: Evolving/moderate complexity  EVALUATION COMPLEXITY: Moderate  GOALS: Goals reviewed with patient? Yes  LONG TERM GOALS: Target date: 01/01/23  Patient will be independent with her HEP. Baseline:  Goal status: MET  2.  Patient will be able to safely ambulate at least 80 feet with a cane or the least restrictive assistive device for improved household mobility. Baseline:  Goal status: MET  3.  Patient will be able to demonstrate active left knee extension within 5 degrees of neutral for improved gait mechanics.  Baseline:2/12: -14 degrees  Goal status: IN PROGRESS  4.  Patient will be able to demonstrate at least 115 degrees of active left knee flexion for improved function navigating stairs. Baseline: 2/12: 97 degrees Goal status: IN PROGRESS  PLAN:  PT FREQUENCY: 2-3x/week  PT DURATION: 4 weeks  PLANNED INTERVENTIONS: Therapeutic exercises, Therapeutic activity, Neuromuscular re-education, Balance training, Gait training, Patient/Family education, Self Care, Joint mobilization, Stair training, Electrical stimulation, Cryotherapy, Moist heat, Vasopneumatic device, Manual therapy, and Re-evaluation  PLAN FOR NEXT SESSION: NuStep, quad sets, heel slides, gastroc and hamstring stretching, manual therapy, and modalities as needed  Kathrynn Ducking, PTA 01/19/2023, 9:50 AM

## 2023-01-22 ENCOUNTER — Ambulatory Visit: Payer: Medicare HMO

## 2023-01-22 DIAGNOSIS — M25662 Stiffness of left knee, not elsewhere classified: Secondary | ICD-10-CM

## 2023-01-22 DIAGNOSIS — M25562 Pain in left knee: Secondary | ICD-10-CM | POA: Diagnosis not present

## 2023-01-22 DIAGNOSIS — R600 Localized edema: Secondary | ICD-10-CM | POA: Diagnosis not present

## 2023-01-22 NOTE — Therapy (Signed)
OUTPATIENT PHYSICAL THERAPY LOWER EXTREMITY TREATMENT   Patient Name: Lindsay Villarreal MRN: [id number] DOB:Oct 20, 1946, 77 y.o., female Today's Date: 01/22/2023  END OF SESSION:  PT End of Session - 01/22/23 0951     Visit Number 18    Number of Visits 18    Date for PT Re-Evaluation 02/13/23    PT Start Time 0945    PT Stop Time [id number]    PT Time Calculation (min) 59 min    Activity Tolerance Patient limited by pain    Behavior During Therapy [facility] for tasks assessed/performed            Past Medical History:  Diagnosis Date   Anemia    Po iron   Arthritis    Bilateral cataracts    Essential hypertension    GERD (gastroesophageal reflux disease)    Heart murmur    History of kidney stones    Hyperlipidemia    Neuromuscular disorder ([place])    Osteoporosis    Type 2 diabetes mellitus ([place])    Past Surgical History:  Procedure Laterality Date   APPENDECTOMY  11/17/1976   BLADDER REPAIR     CATARACT EXTRACTION W/ INTRAOCULAR LENS IMPLANT  2010 (left), 2013 (right)   COLONOSCOPY  04/30/2015   Dr.Perry   COLONOSCOPY  2013   EYE SURGERY     POLYPECTOMY     TOTAL KNEE ARTHROPLASTY Left 12/01/2022   Procedure: TOTAL KNEE ARTHROPLASTY;  Surgeon: Gaynelle Arabian, MD;  Location: [facility];  Service: Orthopedics;  Laterality: Left;   TUBAL LIGATION     VAGINAL HYSTERECTOMY  11/17/1976   XI ROBOTIC ASSISTED INGUINAL HERNIA REPAIR WITH MESH Bilateral 02/22/2021   Procedure: XI ROBOTIC ASSISTED BILATERAL INGUINAL HERNIA REPAIR;  Surgeon: Clovis Riley, MD;  [facility];  Service: General;  Laterality: Bilateral;  2.5 HOURS   Patient Active Problem List   Diagnosis Date Noted   OA (osteoarthritis) of knee 12/01/2022   Primary osteoarthritis of left knee 12/01/2022   Inguinal hernia of right side without obstruction or gangrene 07/12/2020   Heart murmur 07/12/2020   Inguinal hernia of left side without obstruction or gangrene 07/12/2020   Gastroesophageal reflux disease  without esophagitis 10/27/2016   Diabetic neuropathy associated with type 2 diabetes mellitus ([place]) 02/12/2015   Type 2 diabetes mellitus with complication, without long-term current use of insulin ([place]) 06/02/2013   Hypertension 06/02/2013   Anemia 12/16/2011   Osteoporosis 11/26/2011   Hyperlipidemia 11/26/2011   PCP: [name], FNP  REFERRING PROVIDER: Gaynelle Arabian, MD   REFERRING DIAG: S/P Left Total Knee Athroplasty   THERAPY DIAG:  Acute pain of left knee  Stiffness of left knee, not elsewhere classified  Localized edema  Rationale for Evaluation and Treatment: Rehabilitation  ONSET DATE: 12/01/22  SUBJECTIVE:   SUBJECTIVE STATEMENT: Pt reports 4/10 left knee pain today, but reports that pain was "very bad" yesterday.  PERTINENT HISTORY: Hypertension, diabetes, osteoporosis, osteoarthritis, and current smoker  PAIN:  Are you having pain? Yes: NPRS scale: 4/10 Pain location: left leg Pain description: sensitive, severe, stabbing Aggravating factors: moving her leg Relieving factors: ice, medication  PRECAUTIONS: None  PATIENT GOALS: reduced pain, play with her grandchild, improved mobility, and walk without a walker  NEXT MD VISIT: 02/11/2023  OBJECTIVE:  PATIENT SURVEYS:  FOTO 19.19  EDEMA:  Circumferential: L knee: 44 cm R: 37.5 cm   LOWER EXTREMITY ROM:  Active ROM Right eval Left eval  Left AROM 12/31/22  Hip flexion  Hip extension      Hip abduction      Hip adduction      Hip internal rotation      Hip external rotation      Knee flexion 120 41/47 (PROM)  In supine: Active to 90 degrees.   Knee extension 0 9 -5(PROM) 9  Ankle dorsiflexion      Ankle plantarflexion      Ankle inversion      Ankle eversion       (Blank rows = not tested)  LOWER EXTREMITY MMT: not tested due to surgical condition  TODAY'S TREATMENT:                                                                                                                               DATE:                      EXERCISE LOG 01/22/23:LT TKR  Exercise Repetitions and Resistance Comments  Nustep  L4, seat 7-6 x 18 minutes   Rockerboard  x4 min   8 inch box lunges x3 mins   Forward Step Ups 6" box x 20 reps   Extension stretch x3 mins w/ zero knee   L runners stretch    L step down  Instructed for min UE support  LAQ     Manual Therapy Passive ROM: left knee, PROM into flexion and extension to increase ROM, pt reluctant to participate in stretching with over pressure     Modalities  Date:  Unattended Estim: Knee, IFC, 15 mins, Pain Vaso: Knee, Low, 15 mins, Pain  PATIENT EDUCATION:  Education details: Encouraged patient to be compliant to her HEP which she admits she is not doing like she should.    Person educated: Patient Education method: Explanation Education comprehension: verbalized understanding  HOME EXERCISE PROGRAM: Patient did fairly well today.  Did not take pain medication and is not doing her HEP as instructed.  Added standing marching with bilateral UE support.    ASSESSMENT:  CLINICAL IMPRESSION: Pt arrives for today's treatment session reporting 4/10 left knee pain.  Pt demonstrated 101 degrees of active left knee flexion and -20 degrees of active knee extension today.  Pt reports not going back to the MD until 3/20.  Pt has made some progress with her flexion and is strongly encouraged to continue performing stretches at home.  Normal responses to estim and vaso noted upon removal.  Pt reported 3/10 left knee pain at completion of today's treatment session.  OBJECTIVE IMPAIRMENTS: Abnormal gait, decreased activity tolerance, decreased balance, decreased mobility, difficulty walking, decreased ROM, decreased strength, hypomobility, increased edema, impaired flexibility, impaired tone, and pain.   ACTIVITY LIMITATIONS: carrying, lifting, sitting, standing, squatting, sleeping, stairs, transfers, bed mobility, bathing, dressing,  locomotion level, and caring for others  PARTICIPATION LIMITATIONS: meal prep, cleaning, laundry, driving, shopping, community activity, and yard work  PERSONAL FACTORS: Transportation and 3+ comorbidities:  Hypertension, diabetes, osteoporosis, osteoarthritis, and current smoker  are also affecting patient's functional outcome.   REHAB POTENTIAL: Good  CLINICAL DECISION MAKING: Evolving/moderate complexity  EVALUATION COMPLEXITY: Moderate  GOALS: Goals reviewed with patient? Yes  LONG TERM GOALS: Target date: 01/01/23  Patient will be independent with her HEP. Baseline:  Goal status: MET  2.  Patient will be able to safely ambulate at least 80 feet with a cane or the least restrictive assistive device for improved household mobility. Baseline:  Goal status: MET  3.  Patient will be able to demonstrate active left knee extension within 5 degrees of neutral for improved gait mechanics.  Baseline:2/12: -14 degrees; 3/7: -20 degrees  Goal status: IN PROGRESS  4.  Patient will be able to demonstrate at least 115 degrees of active left knee flexion for improved function navigating stairs. Baseline: 2/12: 97 degrees; 3/7: 101 degrees Goal status: IN PROGRESS  PLAN:  PT FREQUENCY: 2-3x/week  PT DURATION: 4 weeks  PLANNED INTERVENTIONS: Therapeutic exercises, Therapeutic activity, Neuromuscular re-education, Balance training, Gait training, Patient/Family education, Self Care, Joint mobilization, Stair training, Electrical stimulation, Cryotherapy, Moist heat, Vasopneumatic device, Manual therapy, and Re-evaluation  PLAN FOR NEXT SESSION: NuStep, quad sets, heel slides, gastroc and hamstring stretching, manual therapy, and modalities as needed  Kathrynn Ducking, PTA 01/22/2023, 11:07 AM

## 2023-01-23 NOTE — Addendum Note (Signed)
Addended by: Darlin Coco on: 01/23/2023 01:28 PM   Modules accepted: Orders

## 2023-01-26 ENCOUNTER — Ambulatory Visit: Payer: Medicare HMO | Admitting: Physical Therapy

## 2023-01-28 ENCOUNTER — Ambulatory Visit: Payer: Medicare HMO

## 2023-01-28 DIAGNOSIS — M25562 Pain in left knee: Secondary | ICD-10-CM | POA: Diagnosis not present

## 2023-01-28 DIAGNOSIS — R600 Localized edema: Secondary | ICD-10-CM | POA: Diagnosis not present

## 2023-01-28 DIAGNOSIS — M25662 Stiffness of left knee, not elsewhere classified: Secondary | ICD-10-CM

## 2023-01-28 NOTE — Therapy (Signed)
OUTPATIENT PHYSICAL THERAPY LOWER EXTREMITY TREATMENT   Patient Name: Lindsay Villarreal MRN: [id number] DOB:25-Oct-1946, 77 y.o., female Today's Date: 01/28/2023  END OF SESSION:  PT End of Session - 01/28/23 0949     Visit Number 19    Number of Visits 20    Date for PT Re-Evaluation 02/13/23    PT Start Time 0945    PT Stop Time [id number]    PT Time Calculation (min) 59 min    Activity Tolerance Patient limited by pain    Behavior During Therapy [facility] for tasks assessed/performed            Past Medical History:  Diagnosis Date   Anemia    Po iron   Arthritis    Bilateral cataracts    Essential hypertension    GERD (gastroesophageal reflux disease)    Heart murmur    History of kidney stones    Hyperlipidemia    Neuromuscular disorder ([place])    Osteoporosis    Type 2 diabetes mellitus ([place])    Past Surgical History:  Procedure Laterality Date   APPENDECTOMY  11/17/1976   BLADDER REPAIR     CATARACT EXTRACTION W/ INTRAOCULAR LENS IMPLANT  2010 (left), 2013 (right)   COLONOSCOPY  04/30/2015   Dr.Perry   COLONOSCOPY  2013   EYE SURGERY     POLYPECTOMY     TOTAL KNEE ARTHROPLASTY Left 12/01/2022   Procedure: TOTAL KNEE ARTHROPLASTY;  Surgeon: Gaynelle Arabian, MD;  [facility];  Service: Orthopedics;  Laterality: Left;   TUBAL LIGATION     VAGINAL HYSTERECTOMY  11/17/1976   XI ROBOTIC ASSISTED INGUINAL HERNIA REPAIR WITH MESH Bilateral 02/22/2021   Procedure: XI ROBOTIC ASSISTED BILATERAL INGUINAL HERNIA REPAIR;  Surgeon: Clovis Riley, MD;  [facility]: [facility];  Service: General;  Laterality: Bilateral;  2.5 HOURS   Patient Active Problem List   Diagnosis Date Noted   OA (osteoarthritis) of knee 12/01/2022   Primary osteoarthritis of left knee 12/01/2022   Inguinal hernia of right side without obstruction or gangrene 07/12/2020   Heart murmur 07/12/2020   Inguinal hernia of left side without obstruction or gangrene 07/12/2020   Gastroesophageal reflux disease  without esophagitis 10/27/2016   Diabetic neuropathy associated with type 2 diabetes mellitus ([place]) 02/12/2015   Type 2 diabetes mellitus with complication, without long-term current use of insulin ([place]) 06/02/2013   Hypertension 06/02/2013   Anemia 12/16/2011   Osteoporosis 11/26/2011   Hyperlipidemia 11/26/2011   PCP: [name], FNP  REFERRING PROVIDER: Gaynelle Arabian, MD   REFERRING DIAG: S/P Left Total Knee Athroplasty   THERAPY DIAG:  Acute pain of left knee  Stiffness of left knee, not elsewhere classified  Localized edema  Rationale for Evaluation and Treatment: Rehabilitation  ONSET DATE: 12/01/22  SUBJECTIVE:   SUBJECTIVE STATEMENT: Pt reports 5/10 left knee pain today.  Pt reports performing stretches at home.   PERTINENT HISTORY: Hypertension, diabetes, osteoporosis, osteoarthritis, and current smoker  PAIN:  Are you having pain? Yes: NPRS scale: 5/10 Pain location: left leg Pain description: sensitive, severe, stabbing Aggravating factors: moving her leg Relieving factors: ice, medication  PRECAUTIONS: None  PATIENT GOALS: reduced pain, play with her grandchild, improved mobility, and walk without a walker  NEXT MD VISIT: 02/11/2023  OBJECTIVE:  PATIENT SURVEYS:  FOTO 19.19  EDEMA:  Circumferential: L knee: 44 cm R: 37.5 cm   LOWER EXTREMITY ROM:  Active ROM Right eval Left eval  Left AROM 12/31/22  Hip flexion  Hip extension      Hip abduction      Hip adduction      Hip internal rotation      Hip external rotation      Knee flexion 120 41/47 (PROM)  In supine: Active to 90 degrees.   Knee extension 0 9 -5(PROM) 9  Ankle dorsiflexion      Ankle plantarflexion      Ankle inversion      Ankle eversion       (Blank rows = not tested)  LOWER EXTREMITY MMT: not tested due to surgical condition  TODAY'S TREATMENT:                                                                                                                               DATE:                      EXERCISE LOG 01/28/23:LT TKR  Exercise Repetitions and Resistance Comments  Nustep  L4, seat 7-6 x 18 minutes   Rockerboard  x4 min   8 inch box lunges x3 mins   Forward Step Ups    Extension stretch x4 mins w/ zero knee   L runners stretch    L step down  Instructed for min UE support  LAQ     Manual Therapy Passive ROM: left knee, PROM into flexion and extension to increase ROM, pt reluctant to participate in stretching with over pressure     Modalities  Date:  Unattended Estim: Knee, IFC, 15 mins, Pain Vaso: Knee, Low, 15 mins, Pain  PATIENT EDUCATION:  Education details: Encouraged patient to be compliant to her HEP which she admits she is not doing like she should.    Person educated: Patient Education method: Explanation Education comprehension: verbalized understanding  HOME EXERCISE PROGRAM: Patient did fairly well today.  Did not take pain medication and is not doing her HEP as instructed.  Added standing marching with bilateral UE support.    ASSESSMENT:  CLINICAL IMPRESSION: Pt arrives for today's treatment session reporting 5/10 left knee pain.  Pt able to demonstrate -14 degrees of active extension today which is an improvement from last treatment session.  Pt encouraged to continue performing stretches at home.  Normal responses to estim and vaso noted upon removal.  Pt reported 3/10 left knee pain at completion of today's treatment session.  OBJECTIVE IMPAIRMENTS: Abnormal gait, decreased activity tolerance, decreased balance, decreased mobility, difficulty walking, decreased ROM, decreased strength, hypomobility, increased edema, impaired flexibility, impaired tone, and pain.   ACTIVITY LIMITATIONS: carrying, lifting, sitting, standing, squatting, sleeping, stairs, transfers, bed mobility, bathing, dressing, locomotion level, and caring for others  PARTICIPATION LIMITATIONS: meal prep, cleaning, laundry, driving,  shopping, community activity, and yard work  PERSONAL FACTORS: Transportation and 3+ comorbidities: Hypertension, diabetes, osteoporosis, osteoarthritis, and current smoker  are also affecting patient's functional outcome.   REHAB POTENTIAL: Good  CLINICAL DECISION MAKING: Evolving/moderate  complexity  EVALUATION COMPLEXITY: Moderate  GOALS: Goals reviewed with patient? Yes  LONG TERM GOALS: Target date: 01/01/23  Patient will be independent with her HEP. Baseline:  Goal status: MET  2.  Patient will be able to safely ambulate at least 80 feet with a cane or the least restrictive assistive device for improved household mobility. Baseline:  Goal status: MET  3.  Patient will be able to demonstrate active left knee extension within 5 degrees of neutral for improved gait mechanics.  Baseline:2/12: -14 degrees; 3/7: -20 degrees  Goal status: IN PROGRESS  4.  Patient will be able to demonstrate at least 115 degrees of active left knee flexion for improved function navigating stairs. Baseline: 2/12: 97 degrees; 3/7: 101 degrees Goal status: IN PROGRESS  PLAN:  PT FREQUENCY: 2-3x/week  PT DURATION: 4 weeks  PLANNED INTERVENTIONS: Therapeutic exercises, Therapeutic activity, Neuromuscular re-education, Balance training, Gait training, Patient/Family education, Self Care, Joint mobilization, Stair training, Electrical stimulation, Cryotherapy, Moist heat, Vasopneumatic device, Manual therapy, and Re-evaluation  PLAN FOR NEXT SESSION: NuStep, quad sets, heel slides, gastroc and hamstring stretching, manual therapy, and modalities as needed  Kathrynn Ducking, PTA 01/28/2023, 10:45 AM

## 2023-01-30 ENCOUNTER — Encounter: Payer: Medicare HMO | Admitting: Physical Therapy

## 2023-02-04 ENCOUNTER — Ambulatory Visit: Payer: Medicare HMO

## 2023-02-04 DIAGNOSIS — M25662 Stiffness of left knee, not elsewhere classified: Secondary | ICD-10-CM

## 2023-02-04 DIAGNOSIS — R600 Localized edema: Secondary | ICD-10-CM

## 2023-02-04 DIAGNOSIS — M25562 Pain in left knee: Secondary | ICD-10-CM | POA: Diagnosis not present

## 2023-02-04 NOTE — Therapy (Signed)
OUTPATIENT PHYSICAL THERAPY LOWER EXTREMITY TREATMENT   Patient Name: Lindsay Villarreal MRN: [id number] DOB:January 20, 1946, 77 y.o., female Today's Date: 02/04/2023  END OF SESSION:  PT End of Session - 02/04/23 0950     Visit Number 20    Number of Visits 20    Date for PT Re-Evaluation 02/13/23    PT Start Time 0945    Activity Tolerance Patient limited by pain    Behavior During Therapy [facility] for tasks assessed/performed            Past Medical History:  Diagnosis Date   Anemia    Po iron   Arthritis    Bilateral cataracts    Essential hypertension    GERD (gastroesophageal reflux disease)    Heart murmur    History of kidney stones    Hyperlipidemia    Neuromuscular disorder ([place])    Osteoporosis    Type 2 diabetes mellitus ([place])    Past Surgical History:  Procedure Laterality Date   APPENDECTOMY  11/17/1976   BLADDER REPAIR     CATARACT EXTRACTION W/ INTRAOCULAR LENS IMPLANT  2010 (left), 2013 (right)   COLONOSCOPY  04/30/2015   Dr.Perry   COLONOSCOPY  2013   EYE SURGERY     POLYPECTOMY     TOTAL KNEE ARTHROPLASTY Left 12/01/2022   Procedure: TOTAL KNEE ARTHROPLASTY;  Surgeon: Gaynelle Arabian, MD;  Location: [facility];  Service: Orthopedics;  Laterality: Left;   TUBAL LIGATION     VAGINAL HYSTERECTOMY  11/17/1976   XI ROBOTIC ASSISTED INGUINAL HERNIA REPAIR WITH MESH Bilateral 02/22/2021   Procedure: XI ROBOTIC ASSISTED BILATERAL INGUINAL HERNIA REPAIR;  Surgeon: Clovis Riley, MD;  Location: [facility];  Service: General;  Laterality: Bilateral;  2.5 HOURS   Patient Active Problem List   Diagnosis Date Noted   OA (osteoarthritis) of knee 12/01/2022   Primary osteoarthritis of left knee 12/01/2022   Inguinal hernia of right side without obstruction or gangrene 07/12/2020   Heart murmur 07/12/2020   Inguinal hernia of left side without obstruction or gangrene 07/12/2020   Gastroesophageal reflux disease without esophagitis 10/27/2016   Diabetic neuropathy associated  with type 2 diabetes mellitus ([place]) 02/12/2015   Type 2 diabetes mellitus with complication, without long-term current use of insulin ([place]) 06/02/2013   Hypertension 06/02/2013   Anemia 12/16/2011   Osteoporosis 11/26/2011   Hyperlipidemia 11/26/2011   PCP: [name], FNP  REFERRING PROVIDER: Gaynelle Arabian, MD   REFERRING DIAG: S/P Left Total Knee Athroplasty   THERAPY DIAG:  Acute pain of left knee  Stiffness of left knee, not elsewhere classified  Localized edema  Rationale for Evaluation and Treatment: Rehabilitation  ONSET DATE: 12/01/22  SUBJECTIVE:   SUBJECTIVE STATEMENT: Pt reports 2/10 left knee pain today.  Pt reports performing stretches at home.   PERTINENT HISTORY: Hypertension, diabetes, osteoporosis, osteoarthritis, and current smoker  PAIN:  Are you having pain? Yes: NPRS scale: 2/10 Pain location: left leg Pain description: sensitive, severe, stabbing Aggravating factors: moving her leg Relieving factors: ice, medication  PRECAUTIONS: None  PATIENT GOALS: reduced pain, play with her grandchild, improved mobility, and walk without a walker  NEXT MD VISIT: 02/11/2023  OBJECTIVE:  PATIENT SURVEYS:  FOTO 19.19  EDEMA:  Circumferential: L knee: 44 cm R: 37.5 cm   LOWER EXTREMITY ROM:  Active ROM Right eval Left eval  Left AROM 12/31/22  Hip flexion      Hip extension      Hip abduction  Hip adduction      Hip internal rotation      Hip external rotation      Knee flexion 120 41/47 (PROM)  In supine: Active to 90 degrees.   Knee extension 0 9 -5(PROM) 9  Ankle dorsiflexion      Ankle plantarflexion      Ankle inversion      Ankle eversion       (Blank rows = not tested)  LOWER EXTREMITY MMT: not tested due to surgical condition  TODAY'S TREATMENT:                                                                                                                              DATE:                      EXERCISE LOG  02/04/23:LT TKR  Exercise Repetitions and Resistance Comments  Nustep  L4, seat 7-6 x 16 minutes   Slant Board  x2 min   14" inch box lunges x3 mins   Forward Step Ups    Extension stretch x4 mins w/ zero knee   L runners stretch    L step down  Instructed for min UE support  LAQ     Manual Therapy Passive ROM: left knee, PROM intoextension to increase ROM, pt reluctant to participate in stretching with over pressure     Modalities  Date:  Unattended Estim: Knee, IFC, 15 mins, Pain Vaso: Knee, Low, 15 mins, Pain  PATIENT EDUCATION:  Education details: Encouraged patient to be compliant to her HEP which she admits she is not doing like she should.    Person educated: Patient Education method: Explanation Education comprehension: verbalized understanding  HOME EXERCISE PROGRAM: Patient did fairly well today.  Did not take pain medication and is not doing her HEP as instructed.  Added standing marching with bilateral UE support.    ASSESSMENT:  CLINICAL IMPRESSION: Pt arrives for today's treatment session 2/10 left knee pain.  Pt reports that pain is much better than it has been.  Pt able to demonstrate 112 degrees of active left knee flexion and -18 degrees of left knee extension.  Pt continues to be reluctant when performing stretches with over pressure.  Pt encouraged to perform stretches at home and to push herself.  Pt has MD appointment next Wednesday March 27th.  Normal responses to estim and vaso noted upon removal.  Pt denied any pain at completion of today's treatment session.  PHYSICAL THERAPY DISCHARGE SUMMARY  Visits from Start of Care: 20  Current functional level related to goals / functional outcomes: Patient was able to partially meet her goals for therapy. She felt comfortable being discharged at this time.    Remaining deficits: Left knee AROM   Education / Equipment: HEP    Patient agrees to discharge. Patient goals were partially met. Patient is being  discharged due to maximized rehab potential.  Jacqulynn Cadet, PT, DPT      OBJECTIVE IMPAIRMENTS: Abnormal gait, decreased activity tolerance, decreased balance, decreased mobility, difficulty walking, decreased ROM, decreased strength, hypomobility, increased edema, impaired flexibility, impaired tone, and pain.   ACTIVITY LIMITATIONS: carrying, lifting, sitting, standing, squatting, sleeping, stairs, transfers, bed mobility, bathing, dressing, locomotion level, and caring for others  PARTICIPATION LIMITATIONS: meal prep, cleaning, laundry, driving, shopping, community activity, and yard work  PERSONAL FACTORS: Transportation and 3+ comorbidities: Hypertension, diabetes, osteoporosis, osteoarthritis, and current smoker  are also affecting patient's functional outcome.   REHAB POTENTIAL: Good  CLINICAL DECISION MAKING: Evolving/moderate complexity  EVALUATION COMPLEXITY: Moderate  GOALS: Goals reviewed with patient? Yes  LONG TERM GOALS: Target date: 01/01/23  Patient will be independent with her HEP. Baseline:  Goal status: MET  2.  Patient will be able to safely ambulate at least 80 feet with a cane or the least restrictive assistive device for improved household mobility. Baseline:  Goal status: MET  3.  Patient will be able to demonstrate active left knee extension within 5 degrees of neutral for improved gait mechanics.  Baseline:2/12: -14 degrees; 3/7: -20 degrees; 3/20: -18 degrees Goal status: IN PROGRESS  4.  Patient will be able to demonstrate at least 115 degrees of active left knee flexion for improved function navigating stairs. Baseline: 2/12: 97 degrees; 3/7: 101 degrees; 3/20: 112 degrees Goal status: IN PROGRESS  PLAN:  PT FREQUENCY: 2-3x/week  PT DURATION: 4 weeks  PLANNED INTERVENTIONS: Therapeutic exercises, Therapeutic activity, Neuromuscular re-education, Balance training, Gait training, Patient/Family education, Self Care, Joint mobilization, Stair  training, Electrical stimulation, Cryotherapy, Moist heat, Vasopneumatic device, Manual therapy, and Re-evaluation  PLAN FOR NEXT SESSION: NuStep, quad sets, heel slides, gastroc and hamstring stretching, manual therapy, and modalities as needed  Kathrynn Ducking, PTA 02/04/2023, 10:44 AM

## 2023-02-24 ENCOUNTER — Other Ambulatory Visit: Payer: Self-pay | Admitting: Nurse Practitioner

## 2023-02-24 DIAGNOSIS — Z1231 Encounter for screening mammogram for malignant neoplasm of breast: Secondary | ICD-10-CM

## 2023-03-11 ENCOUNTER — Ambulatory Visit
Admission: RE | Admit: 2023-03-11 | Discharge: 2023-03-11 | Disposition: A | Discharge status: Home / Self Care | Payer: Medicare HMO | Source: Ambulatory Visit | Attending: Nurse Practitioner | Admitting: Nurse Practitioner

## 2023-03-11 DIAGNOSIS — Z1231 Encounter for screening mammogram for malignant neoplasm of breast: Secondary | ICD-10-CM | POA: Diagnosis not present

## 2023-03-16 ENCOUNTER — Other Ambulatory Visit: Payer: Self-pay | Admitting: Nurse Practitioner

## 2023-04-13 ENCOUNTER — Other Ambulatory Visit: Payer: Self-pay | Admitting: Nurse Practitioner

## 2023-04-13 DIAGNOSIS — E1143 Type 2 diabetes mellitus with diabetic autonomic (poly)neuropathy: Secondary | ICD-10-CM

## 2023-04-14 ENCOUNTER — Other Ambulatory Visit: Payer: Self-pay | Admitting: Nurse Practitioner

## 2023-04-14 DIAGNOSIS — D509 Iron deficiency anemia, unspecified: Secondary | ICD-10-CM

## 2023-04-22 DIAGNOSIS — Z96652 Presence of left artificial knee joint: Secondary | ICD-10-CM | POA: Diagnosis not present

## 2023-04-27 ENCOUNTER — Encounter: Payer: Self-pay | Admitting: Nurse Practitioner

## 2023-04-27 ENCOUNTER — Ambulatory Visit: Payer: Medicare HMO | Admitting: Nurse Practitioner

## 2023-05-04 ENCOUNTER — Other Ambulatory Visit: Payer: Self-pay | Admitting: Nurse Practitioner

## 2023-05-04 DIAGNOSIS — E1143 Type 2 diabetes mellitus with diabetic autonomic (poly)neuropathy: Secondary | ICD-10-CM

## 2023-05-07 DIAGNOSIS — Z5189 Encounter for other specified aftercare: Secondary | ICD-10-CM | POA: Diagnosis not present

## 2023-05-20 DIAGNOSIS — H524 Presbyopia: Secondary | ICD-10-CM | POA: Diagnosis not present

## 2023-05-20 DIAGNOSIS — E119 Type 2 diabetes mellitus without complications: Secondary | ICD-10-CM | POA: Diagnosis not present

## 2023-05-20 DIAGNOSIS — Z961 Presence of intraocular lens: Secondary | ICD-10-CM | POA: Diagnosis not present

## 2023-05-20 LAB — HM DIABETES EYE EXAM

## 2023-05-22 DIAGNOSIS — Z96652 Presence of left artificial knee joint: Secondary | ICD-10-CM | POA: Diagnosis not present

## 2023-05-29 ENCOUNTER — Encounter: Payer: Self-pay | Admitting: Nurse Practitioner

## 2023-05-29 ENCOUNTER — Ambulatory Visit (INDEPENDENT_AMBULATORY_CARE_PROVIDER_SITE_OTHER): Payer: Medicare HMO | Admitting: Nurse Practitioner

## 2023-05-29 VITALS — BP 120/63 | HR 65 | Temp 97.0°F | Resp 20 | Ht 63.0 in | Wt 122.0 lb

## 2023-05-29 DIAGNOSIS — E782 Mixed hyperlipidemia: Secondary | ICD-10-CM | POA: Diagnosis not present

## 2023-05-29 DIAGNOSIS — E119 Type 2 diabetes mellitus without complications: Secondary | ICD-10-CM | POA: Diagnosis not present

## 2023-05-29 DIAGNOSIS — M810 Age-related osteoporosis without current pathological fracture: Secondary | ICD-10-CM | POA: Diagnosis not present

## 2023-05-29 DIAGNOSIS — E1143 Type 2 diabetes mellitus with diabetic autonomic (poly)neuropathy: Secondary | ICD-10-CM

## 2023-05-29 DIAGNOSIS — E118 Type 2 diabetes mellitus with unspecified complications: Secondary | ICD-10-CM | POA: Diagnosis not present

## 2023-05-29 DIAGNOSIS — I10 Essential (primary) hypertension: Secondary | ICD-10-CM | POA: Diagnosis not present

## 2023-05-29 DIAGNOSIS — K219 Gastro-esophageal reflux disease without esophagitis: Secondary | ICD-10-CM

## 2023-05-29 DIAGNOSIS — Z7984 Long term (current) use of oral hypoglycemic drugs: Secondary | ICD-10-CM | POA: Diagnosis not present

## 2023-05-29 DIAGNOSIS — D509 Iron deficiency anemia, unspecified: Secondary | ICD-10-CM | POA: Diagnosis not present

## 2023-05-29 DIAGNOSIS — Z23 Encounter for immunization: Secondary | ICD-10-CM

## 2023-05-29 LAB — CMP14+EGFR
ALT: 9 IU/L (ref 0–32)
AST: 12 IU/L (ref 0–40)
CO2: 26 mmol/L (ref 20–29)
Chloride: 103 mmol/L (ref 96–106)
Globulin, Total: 2 g/dL (ref 1.5–4.5)
Total Protein: 6.3 g/dL (ref 6.0–8.5)

## 2023-05-29 LAB — LIPID PANEL

## 2023-05-29 LAB — CBC WITH DIFFERENTIAL/PLATELET
Basos: 1 %
EOS (ABSOLUTE): 0.1 10*3/uL (ref 0.0–0.4)
Eos: 2 %
Hematocrit: 36.9 % (ref 34.0–46.6)
Immature Grans (Abs): 0 10*3/uL (ref 0.0–0.1)
MCH: 29.4 pg (ref 26.6–33.0)
MCV: 88 fL (ref 79–97)
Monocytes: 7 %
RBC: 4.19 x10E6/uL (ref 3.77–5.28)
RDW: 13.1 % (ref 11.7–15.4)
WBC: 7.3 10*3/uL (ref 3.4–10.8)

## 2023-05-29 LAB — BAYER DCA HB A1C WAIVED: HB A1C (BAYER DCA - WAIVED): 5.7 % — ABNORMAL HIGH (ref 4.8–5.6)

## 2023-05-29 MED ORDER — LISINOPRIL 5 MG PO TABS
5.0000 mg | ORAL_TABLET | Freq: Every day | ORAL | 1 refills | Status: DC
Start: 2023-05-29 — End: 2023-12-07

## 2023-05-29 MED ORDER — FERROUS SULFATE 325 (65 FE) MG PO TABS: 325.0000 mg | ORAL_TABLET | Freq: Every day | ORAL | 1 refills | Status: DC

## 2023-05-29 MED ORDER — SIMVASTATIN 20 MG PO TABS
20.0000 mg | ORAL_TABLET | Freq: Every day | ORAL | 1 refills | Status: DC
Start: 2023-05-29 — End: 2023-11-30

## 2023-05-29 MED ORDER — GABAPENTIN 100 MG PO CAPS: 100.0000 mg | ORAL_CAPSULE | Freq: Three times a day (TID) | ORAL | 1 refills | Status: DC

## 2023-05-29 MED ORDER — METFORMIN HCL 500 MG PO TABS
500.0000 mg | ORAL_TABLET | Freq: Two times a day (BID) | ORAL | 1 refills | Status: DC
Start: 2023-05-29 — End: 2023-12-07

## 2023-05-29 NOTE — Progress Notes (Signed)
Subjective:    Patient ID: Lindsay Villarreal, female    DOB: Jul 28, 1946, 77 y.o.   MRN: [id number]   Chief Complaint: medical management of chronic issues     HPI:  Lindsay Villarreal is a 77 y.o. who identifies as a female who was assigned female at birth.   Social history: Lives with: husband Work history: retired   Water engineer in today for follow up of the following chronic medical issues:  1. Primary hypertension No c/o chest pain, sob or headache. Doe snot check blood pressure at home. BP Readings from Last 3 Encounters:  12/02/22 (!) 116/57  11/21/22 137/66  10/16/22 122/70     2. Mixed hyperlipidemia Does try to watch diet but doe snot do any dedicated exercise. Lab Results  Component Value Date   CHOL 116 10/16/2022   HDL 48 10/16/2022   LDLCALC 45 10/16/2022   TRIG 128 10/16/2022   CHOLHDL 2.4 10/16/2022     3. Diabetes mellitus treated with oral medication (HCC) Fasting blood sugars are running 110-135. Denies any low blood sugars. Lab Results  Component Value Date   HGBA1C 6.6 (H) 10/16/2022    4. Diabetic autonomic neuropathy associated with type 2 diabetes mellitus (HCC) Has tingling in bil feet. No change  5. Gastroesophageal reflux disease without esophagitis Uses OTC meds as needed  6. Iron deficiency anemia, unspecified iron deficiency anemia type On daily iron supplement Lab Results  Component Value Date   HGB 9.5 (L) 12/02/2022    7. Age-related osteoporosis without current pathological fracture Last dexascan was done on 10/16/22. Her t score was -2.1  Wt Readings from Last 3 Encounters:  05/29/23 122 lb (55.3 kg)  12/05/22 130 lb (59 kg)  12/01/22 132 lb 3.2 oz (60 kg)    New complaints: None today  Allergies  Allergen Reactions   Codeine Other (See Comments)    Hallucinations.   Outpatient Encounter Medications as of 05/29/2023  Medication Sig   b complex vitamins capsule Take 1 capsule by mouth daily.   Biotin 14782 MCG TABS  Take 10,000 mcg by mouth daily.   blood glucose meter kit and supplies Dispense based on patient and insurance preference. Use up to four times daily as directed. Dx E11.8   Calcium Carb-Cholecalciferol (CALCIUM 600 + D PO) Take 1 tablet by mouth daily.   Cholecalciferol (VITAMIN D3) 10 MCG (400 UNIT) tablet Take 400 Units by mouth daily.   denosumab (PROLIA) 60 MG/ML SOLN injection Inject 60 mg into the skin every 6 (six) months. Administer in upper arm, thigh, or abdomen   diphenhydrAMINE HCl, Sleep, (ZZZQUIL) 50 MG/30ML LIQD Take 50 mg by mouth at bedtime.   FEROSUL 325 (65 Fe) MG tablet Take 1 tablet by mouth once daily   gabapentin (NEURONTIN) 100 MG capsule TAKE 1 CAPSULE BY MOUTH THREE TIMES DAILY   glucose blood (ONETOUCH VERIO) test strip Test BS QID Dx E11.40   HYDROmorphone (DILAUDID) 2 MG tablet Take 1-2 tablets (2-4 mg total) by mouth every 8 (eight) hours as needed for severe pain.   lisinopril (ZESTRIL) 5 MG tablet Take 1 tablet (5 mg total) by mouth daily.   Menthol-Methyl Salicylate (ARTHRITIS PAIN RELIEF EX) Apply 1 Application topically daily as needed (pain).   metFORMIN (GLUCOPHAGE) 500 MG tablet Take 1 tablet (500 mg total) by mouth 2 (two) times daily.   methocarbamol (ROBAXIN) 500 MG tablet Take 1 tablet (500 mg total) by mouth every 6 (six) hours as needed for  muscle spasms.   OneTouch Delica Lancets 33G MISC USE ONE  TO CHECK GLUCOSE ONCE DAILY Dx E11.40   polyvinyl alcohol (LIQUIFILM TEARS) 1.4 % ophthalmic solution Place 1 drop into both eyes as needed for dry eyes.   simvastatin (ZOCOR) 20 MG tablet Take 1 tablet (20 mg total) by mouth daily.   traMADol (ULTRAM) 50 MG tablet Take 1-2 tablets (50-100 mg total) by mouth every 6 (six) hours as needed for moderate pain.   Zinc 50 MG TABS Take 50 mg by mouth daily.   No facility-administered encounter medications on file as of 05/29/2023.    Past Surgical History:  Procedure Laterality Date   APPENDECTOMY  11/17/1976    BLADDER REPAIR     CATARACT EXTRACTION W/ INTRAOCULAR LENS IMPLANT  2010 (left), 2013 (right)   COLONOSCOPY  04/30/2015   Dr.Perry   COLONOSCOPY  2013   EYE SURGERY     POLYPECTOMY     TOTAL KNEE ARTHROPLASTY Left 12/01/2022   Procedure: TOTAL KNEE ARTHROPLASTY;  Surgeon: Ollen Gross, MD;  Location: [facility];  Service: Orthopedics;  Laterality: Left;   TUBAL LIGATION     VAGINAL HYSTERECTOMY  11/17/1976   XI ROBOTIC ASSISTED INGUINAL HERNIA REPAIR WITH MESH Bilateral 02/22/2021   Procedure: XI ROBOTIC ASSISTED BILATERAL INGUINAL HERNIA REPAIR;  Surgeon: Berna Bue, MD;  Location: [facility];  Service: General;  Laterality: Bilateral;  2.5 HOURS    Family History  Problem Relation Age of Onset   Heart attack Mother    Heart failure Mother    Stomach cancer Father    Pancreatic cancer Father    Liver cancer Father    Other Sister 66       Cancer of Bile Duct   Breast cancer Sister    Diabetes Sister    Brain cancer Sister 30   Sarcoidosis Sister    Colon cancer Neg Hx    Esophageal cancer Neg Hx    Rectal cancer Neg Hx    Colon polyps Neg Hx       Controlled substance contract: n/a      Review of Systems  Constitutional:  Negative for diaphoresis.  Eyes:  Negative for pain.  Respiratory:  Negative for shortness of breath.   Cardiovascular:  Negative for chest pain, palpitations and leg swelling.  Gastrointestinal:  Negative for abdominal pain.  Endocrine: Negative for polydipsia.  Skin:  Negative for rash.  Neurological:  Negative for dizziness, weakness and headaches.  Hematological:  Does not bruise/bleed easily.  All other systems reviewed and are negative.      Objective:   Physical Exam Vitals and nursing note reviewed.  Constitutional:      General: She is not in acute distress.    Appearance: Normal appearance. She is well-developed.  HENT:     Head: Normocephalic.     Right Ear: Tympanic membrane normal.     Left Ear: Tympanic membrane  normal.     Nose: Nose normal.     Mouth/Throat:     Mouth: Mucous membranes are moist.  Eyes:     Pupils: Pupils are equal, round, and reactive to light.  Neck:     Vascular: No carotid bruit or JVD.  Cardiovascular:     Rate and Rhythm: Normal rate and regular rhythm.     Heart sounds: Murmur (3/6 systolic) heard.  Pulmonary:     Effort: Pulmonary effort is normal. No respiratory distress.     Breath sounds: Normal breath sounds.  No wheezing or rales.  Chest:     Chest wall: No tenderness.  Abdominal:     General: Bowel sounds are normal. There is no distension or abdominal bruit.     Palpations: Abdomen is soft. There is no hepatomegaly, splenomegaly, mass or pulsatile mass.     Tenderness: There is no abdominal tenderness.  Musculoskeletal:        General: Normal range of motion.     Cervical back: Normal range of motion and neck supple.  Lymphadenopathy:     Cervical: No cervical adenopathy.  Skin:    General: Skin is warm and dry.  Neurological:     Mental Status: She is alert and oriented to person, place, and time.     Deep Tendon Reflexes: Reflexes are normal and symmetric.  Psychiatric:        Behavior: Behavior normal.        Thought Content: Thought content normal.        Judgment: Judgment normal.     BP 120/63   Pulse 65   Temp (!) 97 F (36.1 C) (Temporal)   Resp 20   Ht 5\' 3"  (1.6 m)   Wt 122 lb (55.3 kg)   SpO2 100%   BMI 21.61 kg/m  Hgba1c discussed at appointment 5.7%      Assessment & Plan:   Lindsay Villarreal comes in today with chief complaint of Medical Management of Chronic Issues   Diagnosis and orders addressed:  1. Primary hypertension Low sodium diet - CBC with Differential/Platelet - CMP14+EGFR - lisinopril (ZESTRIL) 5 MG tablet; Take 1 tablet (5 mg total) by mouth daily.  Dispense: 90 tablet; Refill: 1  2. Mixed hyperlipidemia Low fat diet - Lipid panel - simvastatin (ZOCOR) 20 MG tablet; Take 1 tablet (20 mg total) by  mouth daily.  Dispense: 90 tablet; Refill: 1  3. Diabetes mellitus treated with oral medication (HCC) Continue to watch carbs in diet - Bayer DCA Hb A1c Waived - Microalbumin / creatinine urine ratio - metFORMIN (GLUCOPHAGE) 500 MG tablet; Take 1 tablet (500 mg total) by mouth 2 (two) times daily.  Dispense: 180 tablet; Refill: 1  4. Diabetic autonomic neuropathy associated with type 2 diabetes mellitus (HCC) Do not go bare footed - gabapentin (NEURONTIN) 100 MG capsule; Take 1 capsule (100 mg total) by mouth 3 (three) times daily.  Dispense: 270 capsule; Refill: 1  5. Gastroesophageal reflux disease without esophagitis Avoid spicy foods Do not eat 2 hours prior to bedtime   6. Iron deficiency anemia, unspecified iron deficiency anemia type Continue iron supplement - ferrous sulfate (FEROSUL) 325 (65 FE) MG tablet; Take 1 tablet (325 mg total) by mouth daily.  Dispense: 90 tablet; Refill: 1  7. Age-related osteoporosis without current pathological fracture Weight bearing exercises   Labs pending Health Maintenance reviewed Diet and exercise encouraged  Follow up plan: 6 months   Mary-Margaret Daphine Deutscher, FNP

## 2023-05-30 LAB — CBC WITH DIFFERENTIAL/PLATELET
Basophils Absolute: 0 10*3/uL (ref 0.0–0.2)
Hemoglobin: 12.3 g/dL (ref 11.1–15.9)
Immature Granulocytes: 0 %
Lymphocytes Absolute: 2.7 10*3/uL (ref 0.7–3.1)
Lymphs: 37 %
MCHC: 33.3 g/dL (ref 31.5–35.7)
Monocytes Absolute: 0.5 10*3/uL (ref 0.1–0.9)
Neutrophils Absolute: 3.9 10*3/uL (ref 1.4–7.0)
Neutrophils: 53 %
Platelets: 354 10*3/uL (ref 150–450)

## 2023-05-30 LAB — MICROALBUMIN / CREATININE URINE RATIO
Creatinine, Urine: 108.2 mg/dL
Microalb/Creat Ratio: 14 mg/g creat (ref 0–29)
Microalbumin, Urine: 15.3 ug/mL

## 2023-05-30 LAB — CMP14+EGFR
Albumin: 4.3 g/dL (ref 3.8–4.8)
Alkaline Phosphatase: 65 IU/L (ref 44–121)
BUN/Creatinine Ratio: 25 (ref 12–28)
BUN: 16 mg/dL (ref 8–27)
Bilirubin Total: 0.3 mg/dL (ref 0.0–1.2)
Creatinine, Ser: 0.63 mg/dL (ref 0.57–1.00)
Glucose: 113 mg/dL — ABNORMAL HIGH (ref 70–99)
Potassium: 4.3 mmol/L (ref 3.5–5.2)
Sodium: 140 mmol/L (ref 134–144)

## 2023-05-30 LAB — LIPID PANEL
Cholesterol, Total: 120 mg/dL (ref 100–199)
HDL: 48 mg/dL (ref >39)
Triglycerides: 135 mg/dL (ref 0–149)

## 2023-06-01 NOTE — Addendum Note (Signed)
Addended by: Cleda Daub on: 06/01/2023 07:48 AM   Modules accepted: Orders

## 2023-06-15 DIAGNOSIS — Z5189 Encounter for other specified aftercare: Secondary | ICD-10-CM | POA: Diagnosis not present

## 2023-07-02 ENCOUNTER — Telehealth: Payer: Self-pay

## 2023-07-02 ENCOUNTER — Telehealth: Payer: Self-pay | Admitting: Nurse Practitioner

## 2023-07-02 NOTE — Telephone Encounter (Signed)
Prolia VOB initiated via [email or web address]  Next Prolia inj DUE: NOW

## 2023-07-02 NOTE — Telephone Encounter (Signed)
Please check benefits for Prolia - last injection: 12/31/22

## 2023-07-02 NOTE — Telephone Encounter (Signed)
Created new encounter for Prolia BIV. Will route encounter back once benefit verification is complete.  

## 2023-07-06 ENCOUNTER — Other Ambulatory Visit (HOSPITAL_COMMUNITY): Payer: Self-pay

## 2023-07-06 NOTE — Telephone Encounter (Signed)
Authorization Number : [id number] 12/18/22-12/19/23

## 2023-07-06 NOTE — Telephone Encounter (Signed)
Pt ready for scheduling for PROLIA on or after : 07/06/23  Out-of-pocket cost due at time of visit: $345  Primary: [employer] MEDICARE Prolia co-insurance: 20% Admin fee co-insurance: 20%  Secondary: --- Prolia co-insurance:  Admin fee co-insurance:   Medical Benefit Details: Date Benefits were checked: 07/02/23 Deductible: NO/ Coinsurance: 20%/ Admin Fee: 20%  Prior Auth: APPROVED PA# [id number] Expiration Date: 12/18/22-12/19/23  # of doses approved: 2  Pharmacy benefit: Copay $100 If patient wants fill through the pharmacy benefit please send prescription to: [employer], and include estimated need by date in rx notes. Pharmacy will ship medication directly to the office.  Patient NOT eligible for Prolia Copay Card. Copay Card can make patient's cost as little as $25. Link to apply: [email or web address]://[email or web address]  ** This summary of benefits is an estimation of the patient's out-of-pocket cost. Exact cost may very based on individual plan coverage.

## 2023-07-15 NOTE — Telephone Encounter (Signed)
Patient will check with insurance and let me know what she decides.

## 2023-08-03 NOTE — Telephone Encounter (Signed)
Patient spoke to insurance and said her cost will be $69, she is okay with this so please order.

## 2023-08-17 NOTE — Telephone Encounter (Signed)
Pt called to see why her Prolia shot has not been sent to pharmacy yet? Please advise.

## 2023-08-18 MED ORDER — DENOSUMAB 60 MG/ML ~~LOC~~ SOSY: 60.0000 mg | PREFILLED_SYRINGE | Freq: Once | SUBCUTANEOUS | 0 refills | Status: AC

## 2023-08-18 NOTE — Addendum Note (Signed)
Addended by: Tamera Punt on: 08/18/2023 12:49 PM   Modules accepted: Orders

## 2023-08-18 NOTE — Telephone Encounter (Signed)
Rx sent to [facility] for patient and patient aware.

## 2023-09-04 ENCOUNTER — Telehealth: Payer: Self-pay | Admitting: Nurse Practitioner

## 2023-09-04 NOTE — Telephone Encounter (Signed)
Called and spoke with patient and she states that she has never heard if her rx was sent to pharmacy. Our chart states that it was. Called [facility] to confirm that they do have the rx and they do have it ready for pick up. Patient notified. She states she will pick it up today and made her an appt for Monday to have shot given.

## 2023-09-07 ENCOUNTER — Ambulatory Visit (INDEPENDENT_AMBULATORY_CARE_PROVIDER_SITE_OTHER): Payer: Medicare HMO

## 2023-09-07 DIAGNOSIS — M810 Age-related osteoporosis without current pathological fracture: Secondary | ICD-10-CM

## 2023-09-07 MED ORDER — DENOSUMAB 60 MG/ML ~~LOC~~ SOSY
60.0000 mg | PREFILLED_SYRINGE | Freq: Once | SUBCUTANEOUS | Status: AC
Start: 2023-09-07 — End: 2023-09-07
  Administered 2023-09-07: 60 mg via SUBCUTANEOUS

## 2023-09-07 NOTE — Progress Notes (Signed)
Prolia injection given and patient tolerated well.  

## 2023-09-15 ENCOUNTER — Other Ambulatory Visit: Payer: Self-pay

## 2023-09-15 DIAGNOSIS — Z87891 Personal history of nicotine dependence: Secondary | ICD-10-CM

## 2023-10-05 ENCOUNTER — Telehealth: Payer: Self-pay

## 2023-10-05 ENCOUNTER — Other Ambulatory Visit: Payer: Self-pay

## 2023-10-05 DIAGNOSIS — Z122 Encounter for screening for malignant neoplasm of respiratory organs: Secondary | ICD-10-CM

## 2023-10-05 DIAGNOSIS — Z87891 Personal history of nicotine dependence: Secondary | ICD-10-CM

## 2023-10-05 DIAGNOSIS — F17210 Nicotine dependence, cigarettes, uncomplicated: Secondary | ICD-10-CM

## 2023-10-05 NOTE — Telephone Encounter (Signed)
Called and spoke with patient. SDMV and LDCT scheduled to begin LCS program.

## 2023-10-06 ENCOUNTER — Ambulatory Visit: Payer: Medicare HMO | Admitting: Acute Care

## 2023-10-06 ENCOUNTER — Encounter: Payer: Self-pay | Admitting: Acute Care

## 2023-10-06 DIAGNOSIS — F17210 Nicotine dependence, cigarettes, uncomplicated: Secondary | ICD-10-CM | POA: Diagnosis not present

## 2023-10-06 NOTE — Progress Notes (Signed)
Virtual Visit via Telephone Note  I connected with Katheren Puller on 10/06/23 at  3:30 PM EST by telephone and verified that I am speaking with the correct person using two identifiers.  Location: Patient:  At home Provider: [address], [place], [place], [address]    I discussed the limitations, risks, security and privacy concerns of performing an evaluation and management service by telephone and the availability of in person appointments. I also discussed with the patient that there may be a patient responsible charge related to this service. The patient expressed understanding and agreed to proceed.    Shared Decision Making Visit Lung Cancer Screening Program [phone])   Eligibility: Age 70 y.o. Pack Years Smoking History Calculation 56 pack year smoking history (# packs/per year x # years smoked) Recent History of coughing up blood  no Unexplained weight loss? no ( >Than 15 pounds within the last 6 months ) Prior History Lung / other cancer no (Diagnosis within the last 5 years already requiring surveillance chest CT Scans). Smoking Status Current Smoker Former Smokers: Years since quit:  NA  Quit Date:  NA  Visit Components: Discussion included one or more decision making aids. yes Discussion included risk/benefits of screening. yes Discussion included potential follow up diagnostic testing for abnormal scans. yes Discussion included meaning and risk of over diagnosis. yes Discussion included meaning and risk of False Positives. yes Discussion included meaning of total radiation exposure. yes  Counseling Included: Importance of adherence to annual lung cancer LDCT screening. yes Impact of comorbidities on ability to participate in the program. yes Ability and willingness to under diagnostic treatment. yes  Smoking Cessation Counseling: Current Smokers:  Discussed importance of smoking cessation. yes Information about tobacco cessation classes and  interventions provided to patient. yes Patient provided with "ticket" for LDCT Scan. yes Symptomatic Patient. no  Counseling NA Diagnosis Code: Tobacco Use Z72.0 Asymptomatic Patient yes  Counseling (Intermediate counseling: > three minutes counseling) W1027 Former Smokers:  Discussed the importance of maintaining cigarette abstinence. yes Diagnosis Code: Personal History of Nicotine Dependence. O53.664 Information about tobacco cessation classes and interventions provided to patient. Yes Patient provided with "ticket" for LDCT Scan. yes Written Order for Lung Cancer Screening with LDCT placed in Epic. Yes (CT Chest Lung Cancer Screening Low Dose W/O CM) [id number] Z12.2-Screening of respiratory organs Z87.891-Personal history of nicotine dependence   Bevelyn Ngo, NP 10/06/2023

## 2023-10-06 NOTE — Patient Instructions (Signed)
 Thank you for participating in the  Lung Cancer Screening Program. It was our pleasure to meet you today. We will call you with the results of your scan within the next few days. Your scan will be assigned a Lung RADS category score by the physicians reading the scans.  This Lung RADS score determines follow up scanning.  See below for description of categories, and follow up screening recommendations. We will be in touch to schedule your follow up screening annually or based on recommendations of our providers. We will fax a copy of your scan results to your Primary Care Physician, or the physician who referred you to the program, to ensure they have the results. Please call the office if you have any questions or concerns regarding your scanning experience or results.  Our office number is [phone]. Please speak with Abigail Miyamoto, RN., Karlton Lemon RN, or Pietro Cassis RN. They are  our Lung Cancer Screening RN.'s If They are unavailable when you call, Please leave a message on the voice mail. We will return your call at our earliest convenience.This voice mail is monitored several times a day.  Remember, if your scan is normal, we will scan you annually as long as you continue to meet the criteria for the program. (Age 77-80, Current smoker or smoker who has quit within the last 15 years). If you are a smoker, remember, quitting is the single most powerful action that you can take to decrease your risk of lung cancer and other pulmonary, breathing related problems. We know quitting is hard, and we are here to help.  Please let us know if there is anything we can do to help you meet your goal of quitting. If you are a former smoker, Counselling psychologist. We are proud of you! Remain smoke free! Remember you can refer friends or family members through the number above.  We will screen them to make sure they meet criteria for the program. Thank you for helping [place] take better care of you  by participating in Lung Screening.   Lung RADS Categories:  Lung RADS 1: no nodules or definitely non-concerning nodules.  Recommendation is for a repeat annual scan in 12 months.  Lung RADS 2:  nodules that are non-concerning in appearance and behavior with a very low likelihood of becoming an active cancer. Recommendation is for a repeat annual scan in 12 months.  Lung RADS 3: nodules that are probably non-concerning , includes nodules with a low likelihood of becoming an active cancer.  Recommendation is for a 40-month repeat screening scan. Often noted after an upper respiratory illness. We will be in touch to make sure you have no questions, and to schedule your 77-month scan.  Lung RADS 4 A: nodules with concerning findings, recommendation is most often for a follow up scan in 3 months or additional testing based on our provider's assessment of the scan. We will be in touch to make sure you have no questions and to schedule the recommended 3 month follow up scan.  Lung RADS 4 B:  indicates findings that are concerning. We will be in touch with you to schedule additional diagnostic testing based on our provider's  assessment of the scan.  You can receive free nicotine replacement therapy ( patches, gum or mints) by calling 1-800-QUIT NOW. Please call so we can get you on the path to becoming  a non-smoker. I know it is hard, but you can do this!  Other options for assistance in  smoking cessation ( As covered by your insurance benefits)  Hypnosis for smoking cessation  [employer] [phone]  Acupuncture for smoking cessation  [employer] [phone]

## 2023-10-09 ENCOUNTER — Ambulatory Visit (HOSPITAL_COMMUNITY)
Admission: RE | Admit: 2023-10-09 | Discharge: 2023-10-09 | Disposition: A | Discharge status: Home / Self Care | Payer: Medicare HMO | Source: Ambulatory Visit | Attending: Nurse Practitioner | Admitting: Nurse Practitioner

## 2023-10-09 DIAGNOSIS — F17210 Nicotine dependence, cigarettes, uncomplicated: Secondary | ICD-10-CM | POA: Insufficient documentation

## 2023-10-09 DIAGNOSIS — Z87891 Personal history of nicotine dependence: Secondary | ICD-10-CM | POA: Insufficient documentation

## 2023-10-09 DIAGNOSIS — Z122 Encounter for screening for malignant neoplasm of respiratory organs: Secondary | ICD-10-CM | POA: Diagnosis not present

## 2023-11-29 ENCOUNTER — Other Ambulatory Visit: Payer: Self-pay | Admitting: Nurse Practitioner

## 2023-11-29 DIAGNOSIS — E782 Mixed hyperlipidemia: Secondary | ICD-10-CM

## 2023-12-07 ENCOUNTER — Ambulatory Visit (INDEPENDENT_AMBULATORY_CARE_PROVIDER_SITE_OTHER): Payer: Medicare HMO | Admitting: Nurse Practitioner

## 2023-12-07 ENCOUNTER — Encounter: Payer: Self-pay | Admitting: Nurse Practitioner

## 2023-12-07 VITALS — BP 131/67 | HR 60 | Temp 98.0°F | Ht 63.0 in | Wt 120.0 lb

## 2023-12-07 DIAGNOSIS — I10 Essential (primary) hypertension: Secondary | ICD-10-CM

## 2023-12-07 DIAGNOSIS — E119 Type 2 diabetes mellitus without complications: Secondary | ICD-10-CM

## 2023-12-07 DIAGNOSIS — Z7984 Long term (current) use of oral hypoglycemic drugs: Secondary | ICD-10-CM

## 2023-12-07 DIAGNOSIS — Z23 Encounter for immunization: Secondary | ICD-10-CM | POA: Diagnosis not present

## 2023-12-07 DIAGNOSIS — E785 Hyperlipidemia, unspecified: Secondary | ICD-10-CM | POA: Diagnosis not present

## 2023-12-07 DIAGNOSIS — K219 Gastro-esophageal reflux disease without esophagitis: Secondary | ICD-10-CM

## 2023-12-07 DIAGNOSIS — E1169 Type 2 diabetes mellitus with other specified complication: Secondary | ICD-10-CM | POA: Diagnosis not present

## 2023-12-07 DIAGNOSIS — L89311 Pressure ulcer of right buttock, stage 1: Secondary | ICD-10-CM

## 2023-12-07 DIAGNOSIS — E1143 Type 2 diabetes mellitus with diabetic autonomic (poly)neuropathy: Secondary | ICD-10-CM | POA: Diagnosis not present

## 2023-12-07 DIAGNOSIS — D509 Iron deficiency anemia, unspecified: Secondary | ICD-10-CM

## 2023-12-07 DIAGNOSIS — M810 Age-related osteoporosis without current pathological fracture: Secondary | ICD-10-CM

## 2023-12-07 LAB — BAYER DCA HB A1C WAIVED: HB A1C (BAYER DCA - WAIVED): 5.9 % — ABNORMAL HIGH (ref 4.8–5.6)

## 2023-12-07 MED ORDER — GABAPENTIN 100 MG PO CAPS: 100.0000 mg | ORAL_CAPSULE | Freq: Three times a day (TID) | ORAL | 1 refills | Status: DC

## 2023-12-07 MED ORDER — METFORMIN HCL 500 MG PO TABS: 500.0000 mg | ORAL_TABLET | Freq: Two times a day (BID) | ORAL | 1 refills | Status: DC

## 2023-12-07 MED ORDER — SIMVASTATIN 20 MG PO TABS: 20.0000 mg | ORAL_TABLET | Freq: Every day | ORAL | 0 refills | Status: DC

## 2023-12-07 MED ORDER — FERROUS SULFATE 325 (65 FE) MG PO TABS: 325.0000 mg | ORAL_TABLET | Freq: Every day | ORAL | 1 refills | Status: DC

## 2023-12-07 MED ORDER — CALMOSEPTINE 0.44-20.6 % EX OINT: TOPICAL_OINTMENT | CUTANEOUS | 3 refills | Status: AC

## 2023-12-07 MED ORDER — LISINOPRIL 5 MG PO TABS: 5.0000 mg | ORAL_TABLET | Freq: Every day | ORAL | 1 refills | Status: DC

## 2023-12-07 NOTE — Patient Instructions (Signed)
Pressure Injury  A pressure injury, also called a pressure ulcer or bedsore, is an injury to skin and the tissue under the skin that is caused by pressure. It often affects people who must spend a long time in a bed or chair because of a medical condition. Pressure injuries often occur: Over bony parts of the body, such as the tailbone, shoulders, elbows, hips, heels, spine, ankles, and back of the head. Under medical devices that touch the body. These include stockings, equipment to help with breathing, tubes, and splints. Inside the mouth or nose from dentures or tubes. Pressure injuries start as red areas on the skin and can lead to pain and an open wound. What are the causes? This condition is caused by frequent or constant pressure to an area of the body. Less blood flow to the skin can make the tissue die and break down over time, causing a wound. What increases the risk? You are more likely to develop this condition if: You are in the hospital or an extended care facility. You are bedridden or in a wheelchair. You have an injury or disease that keeps you from moving well and feeling pain or pressure. You have a condition that: Makes you sleepy or less alert. Causes poor blood flow. You need to wear a medical device. You have poor control of your bladder or bowel movements (incontinence). You are not getting enough fluid or nutrients (malnutrition). Your health care provider may recommend certain types of mattresses, mattress covers, pillows, cushions, or boots to help prevent a pressure injury. These may include products filled with air, foam, gel, or sand. What are the signs or symptoms? Symptoms of this condition depend on how severe your injury is. Symptoms may include: Red or dark areas of the skin. Pain or a change in skin texture. Your skin may feel warmer, cooler, softer, or firmer. Blisters. An open wound. How is this diagnosed? This condition is diagnosed based on a  medical history and physical exam. You may also have tests, such as: Blood tests. Imaging tests. Blood flow tests. Your injury will be staged based on how severe it is. Staging is based on: How deep the tissue injury is. This includes whether muscle, bone, tendon, or dead tissue is exposed. The cause of the injury. How is this treated? This condition may be treated by: Reducing pressure on your skin. You may need to: Change your position often. Avoid positions that caused the wound or that may make the wound worse. Use certain mattresses, overlays, chair cushions, or protective boots. Move medical devices from an area of pressure, or place padding between the skin and the device. Use foams, creams, or powders to protect your skin from sweat, urine, and stool and reduce rubbing (friction) on the skin. Keeping your skin clean and dry. This may include using a skin cleanser or barrier as told by your health care provider. Cleaning your injury and getting rid of any dead tissue from the wound (debridement). Placing a protective medicine, such as a cream, or bandage (dressing) over your injury. Using medicines for pain or to prevent or treat infection. Surgery may be needed if other treatments are not working or if your injury is very deep. Follow these instructions at home: Medicines Take over-the-counter and prescription medicines only as told by your health care provider. If you were prescribed antibiotics, take or apply them as told by your health care provider. Do not stop using the antibiotic even if you start to   feel better. Eating and drinking Drink enough fluid to keep your urine pale yellow. Eat a healthy diet with lots of protein, as told by your health care provider. Do not use drugs or drink alcohol. Wound care Follow instructions from your health care provider about how to take care of your wound. Make sure you: Wash your hands with soap and water before and after you change  your dressing or apply medicine to your skin. If soap and water are not available, use hand sanitizer. Change your dressing as told by your health care provider. Check your wound every day for signs of infection. Have a caregiver do this for you if you are not able. Check for: Redness, swelling, or more pain. More fluid or blood. Warmth. Pus or a bad smell. Skin care Keep your skin clean and dry. Gently pat your skin dry. Do not rub or massage your skin. Check your skin every day for any changes in color or any new blisters or sores (ulcers). Reducing pressure Do not lie or sit in one position for a long time. Move or change position every 1-2 hours, or as told by your health care provider. Use pillows or cushions to reduce pressure. Ask your health care provider what cushions or pads you should use. General instructions Do not use any products that contain nicotine or tobacco. These products include cigarettes, chewing tobacco, and vaping devices, such as e-cigarettes. If you need help quitting, ask your health care provider. Try to be active every day. Ask your health care provider what exercises or activities are safe for you. Keep all follow-up visits. Your health care provider will check if your injury is healing. Contact a health care provider if: You have a fever or chills. You have pain that does not get better with medicine. Your skin changes color. You have new blisters or sores. You have signs of infection. Your wound does not get better after 1-2 weeks of treatment. This information is not intended to replace advice given to you by your health care provider. Make sure you discuss any questions you have with your health care provider. Document Revised: 04/29/2022 Document Reviewed: 04/04/2022 Elsevier Patient Education  2024 [employer].

## 2023-12-07 NOTE — Progress Notes (Signed)
Subjective:    Patient ID: Lindsay Villarreal, female    DOB: 07-08-1946, 78 y.o.   MRN: [id number]   Chief Complaint: medical management of chronic issues     HPI:  Lindsay Villarreal is a 78 y.o. who identifies as a female who was assigned female at birth.   Social history: Lives with: husband Work history: retired   Water engineer in today for follow up of the following chronic medical issues:  1. Primary hypertension No c/o chest pain, sob or headache. Doe snot check blood pressure at home. BP Readings from Last 3 Encounters:  05/29/23 120/63  12/02/22 (!) 116/57  11/21/22 137/66     2. Mixed hyperlipidemia Does try to watch diet but doe snot do any dedicated exercise. Lab Results  Component Value Date   CHOL 120 05/29/2023   HDL 48 05/29/2023   LDLCALC 49 05/29/2023   TRIG 135 05/29/2023   CHOLHDL 2.5 05/29/2023     3. Diabetes mellitus treated with oral medication (HCC) Fasting blood sugars are running 110-135. Denies any low blood sugars. Lab Results  Component Value Date   HGBA1C 5.7 (H) 05/29/2023    4. Diabetic autonomic neuropathy associated with type 2 diabetes mellitus (HCC) Has tingling in bil feet. No change  5. Gastroesophageal reflux disease without esophagitis Uses OTC meds as needed  6. Iron deficiency anemia, unspecified iron deficiency anemia type On daily iron supplement Lab Results  Component Value Date   HGB 12.3 05/29/2023    7. Age-related osteoporosis without current pathological fracture Last dexascan was done on 10/16/22. Her t score was -2.1    New complaints: Sore place on buttocks. Has been there about 4-5 months. No drainage. Tender to touch.  Allergies  Allergen Reactions   Codeine Other (See Comments)    Hallucinations.   Outpatient Encounter Medications as of 12/07/2023  Medication Sig   b complex vitamins capsule Take 1 capsule by mouth daily.   Biotin 23762 MCG TABS Take 10,000 mcg by mouth daily.   blood glucose meter  kit and supplies Dispense based on patient and insurance preference. Use up to four times daily as directed. Dx E11.8   Calcium Carb-Cholecalciferol (CALCIUM 600 + D PO) Take 1 tablet by mouth daily.   Cholecalciferol (VITAMIN D3) 10 MCG (400 UNIT) tablet Take 400 Units by mouth daily.   denosumab (PROLIA) 60 MG/ML SOLN injection Inject 60 mg into the skin every 6 (six) months. Administer in upper arm, thigh, or abdomen   diphenhydrAMINE HCl, Sleep, (ZZZQUIL) 50 MG/30ML LIQD Take 50 mg by mouth at bedtime.   ferrous sulfate (FEROSUL) 325 (65 FE) MG tablet Take 1 tablet (325 mg total) by mouth daily.   gabapentin (NEURONTIN) 100 MG capsule Take 1 capsule (100 mg total) by mouth 3 (three) times daily.   glucose blood (ONETOUCH VERIO) test strip Test BS QID Dx E11.40   lisinopril (ZESTRIL) 5 MG tablet Take 1 tablet (5 mg total) by mouth daily.   Menthol-Methyl Salicylate (ARTHRITIS PAIN RELIEF EX) Apply 1 Application topically daily as needed (pain).   metFORMIN (GLUCOPHAGE) 500 MG tablet Take 1 tablet (500 mg total) by mouth 2 (two) times daily.   OneTouch Delica Lancets 33G MISC USE ONE  TO CHECK GLUCOSE ONCE DAILY Dx E11.40   simvastatin (ZOCOR) 20 MG tablet Take 1 tablet by mouth once daily   Zinc 50 MG TABS Take 50 mg by mouth daily.   No facility-administered encounter medications on file as of  12/07/2023.    Past Surgical History:  Procedure Laterality Date   APPENDECTOMY  11/17/1976   BLADDER REPAIR     CATARACT EXTRACTION W/ INTRAOCULAR LENS IMPLANT  2010 (left), 2013 (right)   COLONOSCOPY  04/30/2015   Dr.Perry   COLONOSCOPY  2013   EYE SURGERY     POLYPECTOMY     TOTAL KNEE ARTHROPLASTY Left 12/01/2022   Procedure: TOTAL KNEE ARTHROPLASTY;  Surgeon: Ollen Gross, MD;  Location: [facility];  Service: Orthopedics;  Laterality: Left;   TUBAL LIGATION     VAGINAL HYSTERECTOMY  11/17/1976   XI ROBOTIC ASSISTED INGUINAL HERNIA REPAIR WITH MESH Bilateral 02/22/2021   Procedure: XI  ROBOTIC ASSISTED BILATERAL INGUINAL HERNIA REPAIR;  Surgeon: Berna Bue, MD;  Location: [facility];  Service: General;  Laterality: Bilateral;  2.5 HOURS    Family History  Problem Relation Age of Onset   Heart attack Mother    Heart failure Mother    Stomach cancer Father    Pancreatic cancer Father    Liver cancer Father    Other Sister 58       Cancer of Bile Duct   Breast cancer Sister    Diabetes Sister    Brain cancer Sister 40   Sarcoidosis Sister    Colon cancer Neg Hx    Esophageal cancer Neg Hx    Rectal cancer Neg Hx    Colon polyps Neg Hx       Controlled substance contract: n/a      Review of Systems  Constitutional:  Negative for diaphoresis.  Eyes:  Negative for pain.  Respiratory:  Negative for shortness of breath.   Cardiovascular:  Negative for chest pain, palpitations and leg swelling.  Gastrointestinal:  Negative for abdominal pain.  Endocrine: Negative for polydipsia.  Skin:  Negative for rash.  Neurological:  Negative for dizziness, weakness and headaches.  Hematological:  Does not bruise/bleed easily.  All other systems reviewed and are negative.      Objective:   Physical Exam Vitals and nursing note reviewed.  Constitutional:      General: She is not in acute distress.    Appearance: Normal appearance. She is well-developed.  HENT:     Head: Normocephalic.     Right Ear: Tympanic membrane normal.     Left Ear: Tympanic membrane normal.     Nose: Nose normal.     Mouth/Throat:     Mouth: Mucous membranes are moist.  Eyes:     Pupils: Pupils are equal, round, and reactive to light.  Neck:     Vascular: No carotid bruit or JVD.  Cardiovascular:     Rate and Rhythm: Normal rate and regular rhythm.     Heart sounds: Murmur (3/6 systolic) heard.  Pulmonary:     Effort: Pulmonary effort is normal. No respiratory distress.     Breath sounds: Normal breath sounds. No wheezing or rales.  Chest:     Chest wall: No tenderness.   Abdominal:     General: Bowel sounds are normal. There is no distension or abdominal bruit.     Palpations: Abdomen is soft. There is no hepatomegaly, splenomegaly, mass or pulsatile mass.     Tenderness: There is no abdominal tenderness.  Musculoskeletal:        General: Normal range of motion.     Cervical back: Normal range of motion and neck supple.  Lymphadenopathy:     Cervical: No cervical adenopathy.  Skin:    General:  Skin is warm and dry.     Comments: 3cm erythematous mildly tender area on right buttocks. No drainage, no open area  Neurological:     Mental Status: She is alert and oriented to person, place, and time.     Deep Tendon Reflexes: Reflexes are normal and symmetric.  Psychiatric:        Behavior: Behavior normal.        Thought Content: Thought content normal.        Judgment: Judgment normal.     BP 131/67   Pulse 60   Temp 98 F (36.7 C) (Temporal)   Ht 5\' 3"  (1.6 m)   Wt 120 lb (54.4 kg)   SpO2 100%   BMI 21.26 kg/m   Hgba1c discussed at appointment 5.9%      Assessment & Plan:   CATHYRN PINERA comes in today with chief complaint of medical management of chronic issues    Diagnosis and orders addressed:  1. Primary hypertension Low sodium diet - CBC with Differential/Platelet - CMP14+EGFR - lisinopril (ZESTRIL) 5 MG tablet; Take 1 tablet (5 mg total) by mouth daily.  Dispense: 90 tablet; Refill: 1  2. Mixed hyperlipidemia Low fat diet - Lipid panel - simvastatin (ZOCOR) 20 MG tablet; Take 1 tablet (20 mg total) by mouth daily.  Dispense: 90 tablet; Refill: 1  3. Diabetes mellitus treated with oral medication (HCC) Continue to watch carbs in diet - Bayer DCA Hb A1c Waived - Microalbumin / creatinine urine ratio - metFORMIN (GLUCOPHAGE) 500 MG tablet; Take 1 tablet (500 mg total) by mouth 2 (two) times daily.  Dispense: 180 tablet; Refill: 1  4. Diabetic autonomic neuropathy associated with type 2 diabetes mellitus (HCC) Do not go  bare footed - gabapentin (NEURONTIN) 100 MG capsule; Take 1 capsule (100 mg total) by mouth 3 (three) times daily.  Dispense: 270 capsule; Refill: 1  5. Gastroesophageal reflux disease without esophagitis Avoid spicy foods Do not eat 2 hours prior to bedtime   6. Iron deficiency anemia, unspecified iron deficiency anemia type Continue iron supplement - ferrous sulfate (FEROSUL) 325 (65 FE) MG tablet; Take 1 tablet (325 mg total) by mouth daily.  Dispense: 90 tablet; Refill: 1  7. Age-related osteoporosis without current pathological fracture Weight bearing exercises  8. Decubitus right buttocks- stage I Calmoseptine BID Avoid pressure on area RTO prn  Labs pending Health Maintenance reviewed Diet and exercise encouraged  Follow up plan: 6 months   Mary-Margaret Daphine Deutscher, FNP

## 2023-12-08 LAB — CMP14+EGFR
ALT: 13 [IU]/L (ref 0–32)
AST: 17 [IU]/L (ref 0–40)
Albumin: 4.2 g/dL (ref 3.8–4.8)
Alkaline Phosphatase: 60 [IU]/L (ref 44–121)
BUN/Creatinine Ratio: 28 (ref 12–28)
BUN: 16 mg/dL (ref 8–27)
Bilirubin Total: 0.3 mg/dL (ref 0.0–1.2)
CO2: 24 mmol/L (ref 20–29)
Calcium: 9.2 mg/dL (ref 8.7–10.3)
Chloride: 99 mmol/L (ref 96–106)
Creatinine, Ser: 0.57 mg/dL (ref 0.57–1.00)
Globulin, Total: 2.1 g/dL (ref 1.5–4.5)
Glucose: 102 mg/dL — ABNORMAL HIGH (ref 70–99)
Potassium: 4.1 mmol/L (ref 3.5–5.2)
Sodium: 137 mmol/L (ref 134–144)
Total Protein: 6.3 g/dL (ref 6.0–8.5)
eGFR: 94 mL/min/{1.73_m2} (ref >59)

## 2023-12-08 LAB — CBC WITH DIFFERENTIAL/PLATELET
Basophils Absolute: 0.1 10*3/uL (ref 0.0–0.2)
Basos: 1 %
EOS (ABSOLUTE): 0.1 10*3/uL (ref 0.0–0.4)
Eos: 1 %
Hematocrit: 40.8 % (ref 34.0–46.6)
Hemoglobin: 13.5 g/dL (ref 11.1–15.9)
Immature Grans (Abs): 0 10*3/uL (ref 0.0–0.1)
Immature Granulocytes: 0 %
Lymphocytes Absolute: 2.9 10*3/uL (ref 0.7–3.1)
Lymphs: 35 %
MCH: 30.3 pg (ref 26.6–33.0)
MCHC: 33.1 g/dL (ref 31.5–35.7)
MCV: 92 fL (ref 79–97)
Monocytes Absolute: 0.6 10*3/uL (ref 0.1–0.9)
Monocytes: 7 %
Neutrophils Absolute: 4.5 10*3/uL (ref 1.4–7.0)
Neutrophils: 56 %
Platelets: 340 10*3/uL (ref 150–450)
RBC: 4.45 x10E6/uL (ref 3.77–5.28)
RDW: 11.9 % (ref 11.7–15.4)
WBC: 8.1 10*3/uL (ref 3.4–10.8)

## 2023-12-08 LAB — LIPID PANEL
Chol/HDL Ratio: 2.7 {ratio} (ref 0.0–4.4)
Cholesterol, Total: 130 mg/dL (ref 100–199)
HDL: 49 mg/dL (ref >39)
LDL Chol Calc (NIH): 53 mg/dL (ref 0–99)
Triglycerides: 166 mg/dL — ABNORMAL HIGH (ref 0–149)
VLDL Cholesterol Cal: 28 mg/dL (ref 5–40)

## 2023-12-14 DIAGNOSIS — Z96652 Presence of left artificial knee joint: Secondary | ICD-10-CM | POA: Diagnosis not present

## 2024-01-01 DIAGNOSIS — D225 Melanocytic nevi of trunk: Secondary | ICD-10-CM | POA: Diagnosis not present

## 2024-01-01 DIAGNOSIS — L309 Dermatitis, unspecified: Secondary | ICD-10-CM | POA: Diagnosis not present

## 2024-01-01 DIAGNOSIS — L821 Other seborrheic keratosis: Secondary | ICD-10-CM | POA: Diagnosis not present

## 2024-01-01 DIAGNOSIS — L814 Other melanin hyperpigmentation: Secondary | ICD-10-CM | POA: Diagnosis not present

## 2024-01-01 DIAGNOSIS — L570 Actinic keratosis: Secondary | ICD-10-CM | POA: Diagnosis not present

## 2024-01-08 ENCOUNTER — Ambulatory Visit: Payer: Medicare HMO

## 2024-01-08 VITALS — Ht 63.0 in | Wt 120.0 lb

## 2024-01-08 DIAGNOSIS — Z0000 Encounter for general adult medical examination without abnormal findings: Secondary | ICD-10-CM

## 2024-01-08 NOTE — Patient Instructions (Signed)
Lindsay Villarreal , Thank you for taking time to come for your Medicare Wellness Visit. I appreciate your ongoing commitment to your health goals. Please review the following plan we discussed and let me know if I can assist you in the future.   Referrals/Orders/Follow-Ups/Clinician Recommendations: Aim for 30 minutes of exercise or brisk walking, 6-8 glasses of water, and 5 servings of fruits and vegetables each day.  This is a list of the screening recommended for you and due dates:  Health Maintenance  Topic Date Due   COVID-19 Vaccine (3 - 2024-25 season) 07/19/2023   Complete foot exam   10/17/2023   Eye exam for diabetics  05/19/2024   Yearly kidney health urinalysis for diabetes  05/28/2024   Lipid (cholesterol) test  06/05/2024   Hemoglobin A1C  06/05/2024   Screening for Lung Cancer  10/08/2024   DEXA scan (bone density measurement)  10/16/2024   Yearly kidney function blood test for diabetes  12/06/2024   Medicare Annual Wellness Visit  01/07/2025   Colon Cancer Screening  09/02/2026   DTaP/Tdap/Td vaccine (3 - Td or Tdap) 05/28/2033   Pneumonia Vaccine  Completed   Flu Shot  Completed   Hepatitis C Screening  Completed   Zoster (Shingles) Vaccine  Completed   HPV Vaccine  Aged Out    Advanced directives: (ACP Link)Information on Advanced Care Planning can be found at [facility] Advance Health Care Directives Advance Health Care Directives ([email or web address]   Next Medicare Annual Wellness Visit scheduled for next year: Yes

## 2024-01-08 NOTE — Progress Notes (Signed)
Subjective:   Lindsay Villarreal is a 78 y.o. who presents for a Medicare Wellness preventive visit.  Visit Complete: Virtual I connected with  Lindsay Villarreal on 01/08/24 by a audio enabled telemedicine application and verified that I am speaking with the correct person using two identifiers.  Patient Location: Home  Provider Location: Home Office  I discussed the limitations of evaluation and management by telemedicine. The patient expressed understanding and agreed to proceed.  Vital Signs: Because this visit was a virtual/telehealth visit, some criteria may be missing or patient reported. Any vitals not documented were not able to be obtained and vitals that have been documented are patient reported.  VideoDeclined- This patient declined Librarian, academic. Therefore the visit was completed with audio only.  AWV Questionnaire: No: Patient Medicare AWV questionnaire was not completed prior to this visit.  Cardiac Risk Factors include: advanced age (>10men, >9 women);diabetes mellitus;hypertension;smoking/ tobacco exposure;dyslipidemia     Objective:    Today's Vitals   01/08/24 1441  Weight: 120 lb (54.4 kg)  Height: 5\' 3"  (1.6 m)   Body mass index is 21.26 kg/m.     01/08/2024    2:46 PM 12/05/2022    8:27 AM 12/04/2022    3:53 PM 12/01/2022   11:30 AM 12/01/2022   11:00 AM 11/21/2022    9:16 AM 09/03/2021   10:38 AM  Advanced Directives  Does Patient Have a Medical Advance Directive? No Yes Yes  Yes Yes No  Type of Special educational needs teacher of [place];Living will  Healthcare Power of [employer] Power of [employer] Power of Attorney   Does patient want to make changes to medical advance directive?    No - Patient declined     Copy of Healthcare Power of Attorney in Chart?  No - copy requested   No - copy requested No - copy requested   Would patient like information on creating a medical advance directive? Yes  (MAU/Ambulatory/Procedural Areas - Information given)      No - Patient declined    Current Medications (verified) Outpatient Encounter Medications as of 01/08/2024  Medication Sig   b complex vitamins capsule Take 1 capsule by mouth daily.   Biotin 16109 MCG TABS Take 10,000 mcg by mouth daily.   blood glucose meter kit and supplies Dispense based on patient and insurance preference. Use up to four times daily as directed. Dx E11.8   Calcium Carb-Cholecalciferol (CALCIUM 600 + D PO) Take 1 tablet by mouth daily.   Cholecalciferol (VITAMIN D3) 10 MCG (400 UNIT) tablet Take 400 Units by mouth daily.   denosumab (PROLIA) 60 MG/ML SOLN injection Inject 60 mg into the skin every 6 (six) months. Administer in upper arm, thigh, or abdomen   diphenhydrAMINE HCl, Sleep, (ZZZQUIL) 50 MG/30ML LIQD Take 50 mg by mouth at bedtime.   ferrous sulfate (FEROSUL) 325 (65 FE) MG tablet Take 1 tablet (325 mg total) by mouth daily.   gabapentin (NEURONTIN) 100 MG capsule Take 1 capsule (100 mg total) by mouth 3 (three) times daily.   glucose blood (ONETOUCH VERIO) test strip Test BS QID Dx E11.40   lisinopril (ZESTRIL) 5 MG tablet Take 1 tablet (5 mg total) by mouth daily.   Menthol-Methyl Salicylate (ARTHRITIS PAIN RELIEF EX) Apply 1 Application topically daily as needed (pain).   Menthol-Zinc Oxide (CALMOSEPTINE) 0.44-20.6 % OINT Apply to affected area BID   metFORMIN (GLUCOPHAGE) 500 MG tablet Take 1 tablet (500 mg total)  by mouth 2 (two) times daily.   OneTouch Delica Lancets 33G MISC USE ONE  TO CHECK GLUCOSE ONCE DAILY Dx E11.40   simvastatin (ZOCOR) 20 MG tablet Take 1 tablet (20 mg total) by mouth daily.   Zinc 50 MG TABS Take 50 mg by mouth daily.   No facility-administered encounter medications on file as of 01/08/2024.    Allergies (verified) Codeine   History: Past Medical History:  Diagnosis Date   Anemia    Po iron   Arthritis    Bilateral cataracts    Essential hypertension    GERD  (gastroesophageal reflux disease)    Heart murmur    History of kidney stones    Hyperlipidemia    Neuromuscular disorder (HCC)    Osteoporosis    Type 2 diabetes mellitus (HCC)    Past Surgical History:  Procedure Laterality Date   APPENDECTOMY  11/17/1976   BLADDER REPAIR     CATARACT EXTRACTION W/ INTRAOCULAR LENS IMPLANT  2010 (left), 2013 (right)   COLONOSCOPY  04/30/2015   Dr.Perry   COLONOSCOPY  2013   EYE SURGERY     POLYPECTOMY     TOTAL KNEE ARTHROPLASTY Left 12/01/2022   Procedure: TOTAL KNEE ARTHROPLASTY;  Surgeon: Ollen Gross, MD;  Location: [facility];  Service: Orthopedics;  Laterality: Left;   TUBAL LIGATION     VAGINAL HYSTERECTOMY  11/17/1976   XI ROBOTIC ASSISTED INGUINAL HERNIA REPAIR WITH MESH Bilateral 02/22/2021   Procedure: XI ROBOTIC ASSISTED BILATERAL INGUINAL HERNIA REPAIR;  Surgeon: Berna Bue, MD;  Location: [facility];  Service: General;  Laterality: Bilateral;  2.5 HOURS   Family History  Problem Relation Age of Onset   Heart attack Mother    Heart failure Mother    Stomach cancer Father    Pancreatic cancer Father    Liver cancer Father    Other Sister 31       Cancer of Bile Duct   Breast cancer Sister    Diabetes Sister    Brain cancer Sister 42   Sarcoidosis Sister    Colon cancer Neg Hx    Esophageal cancer Neg Hx    Rectal cancer Neg Hx    Colon polyps Neg Hx    Social History   Socioeconomic History   Marital status: Divorced    Spouse name: Not on file   Number of children: 3   Years of education: 12   Highest education level: High school graduate  Occupational History   Occupation: Sales promotion account executive, Statistician    Comment: retired  Tobacco Use   Smoking status: Every Day    Current packs/day: 0.50    Average packs/day: 0.5 packs/day for 50.0 years (25.0 ttl pk-yrs)    Types: Cigarettes   Smokeless tobacco: Never  Vaping Use   Vaping status: Never Used  Substance and Sexual Activity   Alcohol use: No   Drug use: No    Sexual activity: Not Currently  Other Topics Concern   Not on file  Social History Narrative   Lives alone - one level. Children live close by   Social Drivers of Health   Financial Resource Strain: Low Risk  (01/08/2024)   Overall Financial Resource Strain (CARDIA)    Difficulty of Paying Living Expenses: Not hard at all  Food Insecurity: No Food Insecurity (01/08/2024)   Hunger Vital Sign    Worried About Running Out of Food in the Last Year: Never true    Ran Out of Food  in the Last Year: Never true  Transportation Needs: No Transportation Needs (01/08/2024)   PRAPARE - Administrator, Civil Service (Medical): No    Lack of Transportation (Non-Medical): No  Physical Activity: Insufficiently Active (01/08/2024)   Exercise Vital Sign    Days of Exercise per Week: 3 days    Minutes of Exercise per Session: 30 min  Stress: No Stress Concern Present (01/08/2024)   [employer] of Occupational Health - Occupational Stress Questionnaire    Feeling of Stress : Not at all  Social Connections: Socially Isolated (01/08/2024)   Social Connection and Isolation Panel [NHANES]    Frequency of Communication with Friends and Family: More than three times a week    Frequency of Social Gatherings with Friends and Family: Three times a week    Attends Religious Services: Never    Active Member of Clubs or Organizations: No    Attends Engineer, structural: Never    Marital Status: Divorced    Tobacco Counseling Ready to quit: Not Answered Counseling given: Not Answered    Clinical Intake:  Pre-visit preparation completed: Yes  Pain : No/denies pain     Diabetes: Yes CBG done?: No Did pt. bring in CBG monitor from home?: No  How often do you need to have someone help you when you read instructions, pamphlets, or other written materials from your doctor or pharmacy?: 1 - Never  Interpreter Needed?: No  Information entered by :: Kandis Fantasia  LPN   Activities of Daily Living     01/08/2024    2:45 PM  In your present state of health, do you have any difficulty performing the following activities:  Hearing? 0  Vision? 0  Difficulty concentrating or making decisions? 0  Walking or climbing stairs? 0  Dressing or bathing? 0  Doing errands, shopping? 0  Preparing Food and eating ? N  Using the Toilet? N  In the past six months, have you accidently leaked urine? N  Do you have problems with loss of bowel control? N  Managing your Medications? N  Managing your Finances? N  Housekeeping or managing your Housekeeping? N    Patient Care Team: Bennie Pierini, FNP as PCP - General (Nurse Practitioner) Jonelle Sidle, MD as PCP - Cardiology (Cardiology) Smitty Cords, OD as Consulting Physician (Optometry) Cyndie Chime Genene Churn, MD as Consulting Physician (Hematology) Hilarie Fredrickson, MD as Consulting Physician (Gastroenterology)  Indicate any recent Medical Services you may have received from other than [facility] providers in the past year (date may be approximate).     Assessment:   This is a routine wellness examination for Lindsay Villarreal.  Hearing/Vision screen Hearing Screening - Comments:: Denies hearing difficulties   Vision Screening - Comments:: Wears rx glasses - up to date with routine eye exams with [facility] [place]       Goals Addressed   None    Depression Screen     01/08/2024    2:43 PM 12/07/2023   11:58 AM 05/29/2023   10:45 AM 12/05/2022    8:26 AM 10/16/2022    8:43 AM 04/15/2022    8:41 AM 10/22/2021    9:15 AM  PHQ 2/9 Scores  PHQ - 2 Score 0 0 0 0 0 2 0  PHQ- 9 Score   5 0 2 6 0    Fall Risk     01/08/2024    2:45 PM 12/07/2023   11:57 AM 05/29/2023   10:44 AM 12/05/2022  8:24 AM 10/16/2022    8:43 AM  Fall Risk   Falls in the past year? 0 1 0 0 0  Number falls in past yr: 0 0  0   Injury with Fall? 0 0  0   Risk for fall due to : No Fall Risks History of fall(s)  No Fall Risks    Follow up Falls prevention discussed;Education provided;Falls evaluation completed Education provided  Falls prevention discussed     MEDICARE RISK AT HOME:  Medicare Risk at Home Any stairs in or around the home?: No If so, are there any without handrails?: No Home free of loose throw rugs in walkways, pet beds, electrical cords, etc?: Yes Adequate lighting in your home to reduce risk of falls?: Yes Life alert?: No Use of a cane, walker or w/c?: No Grab bars in the bathroom?: Yes Shower chair or bench in shower?: No Elevated toilet seat or a handicapped toilet?: Yes  TIMED UP AND GO:  Was the test performed?  No  Cognitive Function: 6CIT completed    03/09/2017    9:40 AM 02/18/2016    9:43 AM 02/12/2015   10:37 AM  MMSE - Mini Mental State Exam  Orientation to time 4 5 5   Orientation to Place 5 5 5   Registration 3 3 3   Attention/ Calculation 5 5 5   Recall 3 3 3   Language- name 2 objects 2 2 2   Language- repeat 1 1 1   Language- follow 3 step command 3 3 3   Language- read & follow direction 1 1 1   Write a sentence 1 1 1   Copy design 1 1 1   Total score 29 30 30         01/08/2024    2:46 PM 12/05/2022    8:28 AM 08/30/2020    8:41 AM 08/30/2019    8:36 AM  6CIT Screen  What Year? 0 points 0 points 0 points 0 points  What month? 0 points 0 points 0 points 0 points  What time? 0 points 0 points 0 points 0 points  Count back from 20 0 points 0 points 0 points 0 points  Months in reverse 0 points 0 points 0 points 0 points  Repeat phrase 0 points 0 points 0 points 0 points  Total Score 0 points 0 points 0 points 0 points    Immunizations Immunization History  Administered Date(s) Administered   Fluad Quad(high Dose 65+) 09/17/2021, 10/16/2022   Fluad Trivalent(High Dose 65+) 12/07/2023   Hepatitis B 12/01/2018   Hepatitis B, ADULT 02/22/2018, 12/01/2018   Influenza, High Dose Seasonal PF 10/04/2015, 09/04/2016, 09/17/2017, 08/07/2018, 10/11/2019    Influenza,inj,Quad PF,6+ Mos 09/09/2013, 08/14/2014   Moderna Sars-Covid-2 Vaccination 11/19/2019, 01/13/2020   Pneumococcal Conjugate Pcv21, Polysaccharide Crm197 Conjugaf 01/01/2024   Pneumococcal Conjugate-13 10/30/2014   Pneumococcal Polysaccharide-23 07/30/2012   Respiratory Syncytial Virus Vaccine,Recomb Aduvanted(Arexvy) 01/01/2024   Tdap 07/30/2012, 05/29/2023   Zoster Recombinant(Shingrix) 08/07/2018, 11/13/2018    Screening Tests Health Maintenance  Topic Date Due   COVID-19 Vaccine (3 - 2024-25 season) 07/19/2023   FOOT EXAM  10/17/2023   OPHTHALMOLOGY EXAM  05/19/2024   Diabetic kidney evaluation - Urine ACR  05/28/2024   LIPID PANEL  06/05/2024   HEMOGLOBIN A1C  06/05/2024   Lung Cancer Screening  10/08/2024   DEXA SCAN  10/16/2024   Diabetic kidney evaluation - eGFR measurement  12/06/2024   Medicare Annual Wellness (AWV)  01/07/2025   Colonoscopy  09/02/2026   DTaP/Tdap/Td (3 - Td  or Tdap) 05/28/2033   Pneumonia Vaccine 49+ Years old  Completed   INFLUENZA VACCINE  Completed   Hepatitis C Screening  Completed   Zoster Vaccines- Shingrix  Completed   HPV VACCINES  Aged Out    Health Maintenance  Health Maintenance Due  Topic Date Due   COVID-19 Vaccine (3 - 2024-25 season) 07/19/2023   FOOT EXAM  10/17/2023    Additional Screening:  Vision Screening: Recommended annual ophthalmology exams for early detection of glaucoma and other disorders of the eye.  Dental Screening: Recommended annual dental exams for proper oral hygiene  Community Resource Referral / Chronic Care Management: CRR required this visit?  No   CCM required this visit?  No     Plan:     I have personally reviewed and noted the following in the patient's chart:   Medical and social history Use of alcohol, tobacco or illicit drugs  Current medications and supplements including opioid prescriptions. Patient is not currently taking opioid prescriptions. Functional ability and  status Nutritional status Physical activity Advanced directives List of other physicians Hospitalizations, surgeries, and ER visits in previous 12 months Vitals Screenings to include cognitive, depression, and falls Referrals and appointments  In addition, I have reviewed and discussed with patient certain preventive protocols, quality metrics, and best practice recommendations. A written personalized care plan for preventive services as well as general preventive health recommendations were provided to patient.     Kandis Fantasia [place], [place]   [id number]   After Visit Summary: (MyChart) Due to this being a telephonic visit, the after visit summary with patients personalized plan was offered to patient via MyChart   Notes: Nothing significant to report at this time.

## 2024-01-19 DIAGNOSIS — E1151 Type 2 diabetes mellitus with diabetic peripheral angiopathy without gangrene: Secondary | ICD-10-CM | POA: Diagnosis not present

## 2024-01-19 DIAGNOSIS — E1142 Type 2 diabetes mellitus with diabetic polyneuropathy: Secondary | ICD-10-CM | POA: Diagnosis not present

## 2024-01-19 DIAGNOSIS — I10 Essential (primary) hypertension: Secondary | ICD-10-CM | POA: Diagnosis not present

## 2024-01-19 DIAGNOSIS — Z833 Family history of diabetes mellitus: Secondary | ICD-10-CM | POA: Diagnosis not present

## 2024-01-19 DIAGNOSIS — E785 Hyperlipidemia, unspecified: Secondary | ICD-10-CM | POA: Diagnosis not present

## 2024-01-19 DIAGNOSIS — F17210 Nicotine dependence, cigarettes, uncomplicated: Secondary | ICD-10-CM | POA: Diagnosis not present

## 2024-01-19 DIAGNOSIS — Z7984 Long term (current) use of oral hypoglycemic drugs: Secondary | ICD-10-CM | POA: Diagnosis not present

## 2024-01-19 DIAGNOSIS — M810 Age-related osteoporosis without current pathological fracture: Secondary | ICD-10-CM | POA: Diagnosis not present

## 2024-01-19 DIAGNOSIS — R32 Unspecified urinary incontinence: Secondary | ICD-10-CM | POA: Diagnosis not present

## 2024-01-19 DIAGNOSIS — Z008 Encounter for other general examination: Secondary | ICD-10-CM | POA: Diagnosis not present

## 2024-01-19 DIAGNOSIS — R011 Cardiac murmur, unspecified: Secondary | ICD-10-CM | POA: Diagnosis not present

## 2024-01-19 DIAGNOSIS — D509 Iron deficiency anemia, unspecified: Secondary | ICD-10-CM | POA: Diagnosis not present

## 2024-01-19 DIAGNOSIS — M1990 Unspecified osteoarthritis, unspecified site: Secondary | ICD-10-CM | POA: Diagnosis not present

## 2024-01-25 ENCOUNTER — Telehealth: Payer: Self-pay

## 2024-01-25 DIAGNOSIS — M810 Age-related osteoporosis without current pathological fracture: Secondary | ICD-10-CM

## 2024-01-25 MED ORDER — DENOSUMAB 60 MG/ML ~~LOC~~ SOSY
60.0000 mg | PREFILLED_SYRINGE | Freq: Once | SUBCUTANEOUS | Status: AC
Start: 2024-03-08 — End: 2024-03-16
  Administered 2024-03-16: 60 mg via SUBCUTANEOUS

## 2024-01-25 NOTE — Telephone Encounter (Signed)
 Prolia ordered for PA team to review verification benefits

## 2024-02-09 ENCOUNTER — Other Ambulatory Visit: Payer: Self-pay | Admitting: Nurse Practitioner

## 2024-02-09 DIAGNOSIS — Z1231 Encounter for screening mammogram for malignant neoplasm of breast: Secondary | ICD-10-CM

## 2024-02-17 ENCOUNTER — Other Ambulatory Visit (HOSPITAL_COMMUNITY): Payer: Self-pay

## 2024-02-17 ENCOUNTER — Telehealth: Payer: Self-pay

## 2024-02-17 NOTE — Telephone Encounter (Signed)
 Prolia VOB initiated via [email or web address]  Next Prolia inj DUE: 03/07/24

## 2024-02-19 NOTE — Telephone Encounter (Signed)
 [name]

## 2024-02-19 NOTE — Telephone Encounter (Signed)
 Pharmacy Patient Advocate Encounter   Received notification from  [facility] Portal that prior authorization for PROLIA is required/requested.   Insurance verification completed.   The patient is insured through [employer] .   Per test claim: PA required; PA submitted to above mentioned insurance via Avail[employer] Key/confirmation #/EOC [id number] Status is pending

## 2024-02-24 ENCOUNTER — Other Ambulatory Visit (HOSPITAL_COMMUNITY): Payer: Self-pay

## 2024-02-24 NOTE — Telephone Encounter (Signed)
 Pt ready for scheduling for PROLIA on or after : 03/07/24  Option# 1: Buy/Bill (Office supplied medication)  Out-of-pocket cost due at time of clinic visit: $357  Number of injection/visits approved: 2  Primary: [employer] Prolia co-insurance: 20% Admin fee co-insurance: 20%  Secondary: --- Prolia co-insurance:  Admin fee co-insurance:   Medical Benefit Details: Date Benefits were checked: 02/17/24 Deductible: NO/ Coinsurance: 20%/ Admin Fee: 20%  Prior Auth: APPROVED PA# [id number] Expiration Date: 02/19/24-02/18/25   # of doses approved:2 ----------------------------------------------------------------------- Option# 2- Med Obtained from pharmacy:  Pharmacy benefit: Copay $---REFILL TOO SOON (Paid to pharmacy) Admin Fee: 20% (Pay at clinic)  Prior Auth: N/A PA# Expiration Date:   # of doses approved:   If patient wants fill through the pharmacy benefit please send prescription to: [employer], and include estimated need by date in rx notes. Pharmacy will ship medication directly to the office.  Patient NOT eligible for Prolia Copay Card. Copay Card can make patient's cost as little as $25. Link to apply: [email or web address]://[email or web address]/copay  ** This summary of benefits is an estimation of the patient's out-of-pocket cost. Exact cost may very based on individual plan coverage.

## 2024-03-04 DIAGNOSIS — B001 Herpesviral vesicular dermatitis: Secondary | ICD-10-CM | POA: Diagnosis not present

## 2024-03-04 DIAGNOSIS — L249 Irritant contact dermatitis, unspecified cause: Secondary | ICD-10-CM | POA: Diagnosis not present

## 2024-03-07 ENCOUNTER — Telehealth: Payer: Self-pay | Admitting: Family Medicine

## 2024-03-07 NOTE — Telephone Encounter (Signed)
 Please review

## 2024-03-07 NOTE — Telephone Encounter (Signed)
 Copied from CRM [id number]. Topic: General - Other >> Mar 04, 2024  1:47 PM Bearl Botts E wrote: Reason for CRM: Pt says that she wants the prolia  injection instead of an alternative please advise.   Best contact: [phone] Please contact when injection is ready

## 2024-03-09 ENCOUNTER — Telehealth: Payer: Self-pay | Admitting: Cardiology

## 2024-03-09 ENCOUNTER — Ambulatory Visit: Payer: Medicare HMO | Attending: Cardiology | Admitting: Cardiology

## 2024-03-09 ENCOUNTER — Encounter: Payer: Self-pay | Admitting: Cardiology

## 2024-03-09 VITALS — BP 116/70 | HR 69 | Ht 64.0 in | Wt 126.3 lb

## 2024-03-09 DIAGNOSIS — I700 Atherosclerosis of aorta: Secondary | ICD-10-CM | POA: Diagnosis not present

## 2024-03-09 DIAGNOSIS — R011 Cardiac murmur, unspecified: Secondary | ICD-10-CM | POA: Diagnosis not present

## 2024-03-09 DIAGNOSIS — I361 Nonrheumatic tricuspid (valve) insufficiency: Secondary | ICD-10-CM | POA: Diagnosis not present

## 2024-03-09 DIAGNOSIS — I77819 Aortic ectasia, unspecified site: Secondary | ICD-10-CM | POA: Diagnosis not present

## 2024-03-09 DIAGNOSIS — I340 Nonrheumatic mitral (valve) insufficiency: Secondary | ICD-10-CM | POA: Diagnosis not present

## 2024-03-09 DIAGNOSIS — I358 Other nonrheumatic aortic valve disorders: Secondary | ICD-10-CM

## 2024-03-09 NOTE — Patient Instructions (Signed)
 Medication Instructions:  Your physician recommends that you continue on your current medications as directed. Please refer to the Current Medication list given to you today.  *If you need a refill on your cardiac medications before your next appointment, please call your pharmacy*  Lab Work: NONE   If you have labs (blood work) drawn today and your tests are completely normal, you will receive your results only by: MyChart Message (if you have MyChart) OR A paper copy in the mail If you have any lab test that is abnormal or we need to change your treatment, we will call you to review the results.  Testing/Procedures: Your physician has requested that you have an echocardiogram. Echocardiography is a painless test that uses sound waves to create images of your heart. It provides your doctor with information about the size and shape of your heart and how well your heart's chambers and valves are working. This procedure takes approximately one hour. There are no restrictions for this procedure. Please do NOT wear cologne, perfume, aftershave, or lotions (deodorant is allowed). Please arrive 15 minutes prior to your appointment time.  Please note: We ask at that you not bring children with you during ultrasound (echo/ vascular) testing. Due to room size and safety concerns, children are not allowed in the ultrasound rooms during exams. Our front office staff cannot provide observation of children in our lobby area while testing is being conducted. An adult accompanying a patient to their appointment will only be allowed in the ultrasound room at the discretion of the ultrasound technician under special circumstances. We apologize for any inconvenience.   Follow-Up: At [facility], you and your health needs are our priority.  As part of our continuing mission to provide you with exceptional heart care, our providers are all part of one team.  This team includes your primary Cardiologist  (physician) and Advanced Practice Providers or APPs (Physician Assistants and Nurse Practitioners) who all work together to provide you with the care you need, when you need it.  Your next appointment:    To Be Determined   Provider:   You may see Teddie Favre, MD or the following Advanced Practice Provider on your designated Care Team:   Lasalle Pointer, NP    We recommend signing up for the patient portal called "MyChart".  Sign up information is provided on this After Visit Summary.  MyChart is used to connect with patients for Virtual Visits (Telemedicine).  Patients are able to view lab/test results, encounter notes, upcoming appointments, etc.  Non-urgent messages can be sent to your provider as well.   To learn more about what you can do with MyChart, go to [email or web address].   Other Instructions Thank you for choosing [facility]!

## 2024-03-09 NOTE — Telephone Encounter (Signed)
 Pre-cert Verification for the following procedure     Echo in [facility]

## 2024-03-09 NOTE — Progress Notes (Signed)
    Cardiology Office Note  Date: 03/09/2024   ID: Lindsay Villarreal, DOB May 18, 1946, MRN [id number]  History of Present Illness: Lindsay Villarreal is a 78 y.o. female last seen in the office back in December 2021.  She presents overdue for a follow-up visit.  Continues to follow regularly with PCP, she does not describe any new exertional symptoms.  She remains active with ADLs including push mowing her yard.  She does not report any sudden palpitations or syncope, no exertional chest pain.  We went over her medications.  She reports compliance with regimen and no obvious intolerances.  I did review her interval lab work which is noted below.  I reviewed her ECG today showing sinus rhythm with PAC, borderline low voltage.  We discussed her echocardiogram findings from 2021.  Physical Exam: VS:  BP 116/70   Pulse 69   Ht 5\' 4"  (1.626 m)   Wt 126 lb 4.8 oz (57.3 kg)   SpO2 100%   BMI 21.68 kg/m , BMI Body mass index is 21.68 kg/m.  Wt Readings from Last 3 Encounters:  03/09/24 126 lb 4.8 oz (57.3 kg)  01/08/24 120 lb (54.4 kg)  12/07/23 120 lb (54.4 kg)    General: Patient appears comfortable at rest. HEENT: Conjunctiva and lids normal. Neck: Supple, no elevated JVP or carotid bruits. Lungs: Clear to auscultation, nonlabored breathing at rest. Cardiac: Regular rate and rhythm, no S3, 3/6 right basal systolic murmur, no pericardial rub. Extremities: No pitting edema.  ECG:  An ECG dated 10/16/2022 was personally reviewed today and demonstrated:  Sinus bradycardia with nonspecific T wave changes.  Labwork: 12/07/2023: ALT 13; AST 17; BUN 16; Creatinine, Ser 0.57; Hemoglobin 13.5; Platelets 340; Potassium 4.1; Sodium 137     Component Value Date/Time   CHOL 130 12/07/2023 1157   CHOL 134 06/02/2013 0854   TRIG 166 (H) 12/07/2023 1157   TRIG 182 (H) 02/19/2015 1041   TRIG 126 06/02/2013 0854   HDL 49 12/07/2023 1157   HDL 35 (L) 02/19/2015 1041   HDL 35 (L) 06/02/2013 0854    CHOLHDL 2.7 12/07/2023 1157   LDLCALC 53 12/07/2023 1157   LDLCALC 46 07/21/2014 0903   LDLCALC 74 06/02/2013 0854   Other Studies Reviewed Today:  No interval cardiac testing for review today.  Assessment and Plan:  1.  Aortic valve sclerosis without stenosis by echocardiogram in December 2021.  Relatively small LVOT and basal septal LV hypertrophy contributing to cardiac murmur at that time as well.  Murmur remains prominent today, we will plan a follow-up echocardiogram and continue surveillance.  2.  Mild to moderate mitral regurgitation and tricuspid regurgitation by echocardiogram in December 2021.  This will also be reassessed by follow-up echocardiogram.  3.  Aortic and coronary artery calcification by CT imaging.  She is asymptomatic and is on Zocor  20 mg daily with recent LDL 53.  4.  Mildly dilated ascending thoracic aorta at 3.9 cm by chest CT in November 2024.  Asymptomatic.  5.  Primary hypertension.  Disposition:  Follow up  test results.  Signed, Gerard Knight, M.D., F.A.C.C. [facility]

## 2024-03-10 NOTE — Telephone Encounter (Signed)
 Appt made for 03/16/2024

## 2024-03-16 ENCOUNTER — Ambulatory Visit
Admission: RE | Admit: 2024-03-16 | Discharge: 2024-03-16 | Disposition: A | Discharge status: Home / Self Care | Source: Ambulatory Visit | Attending: Nurse Practitioner | Admitting: Nurse Practitioner

## 2024-03-16 ENCOUNTER — Ambulatory Visit

## 2024-03-16 DIAGNOSIS — Z1231 Encounter for screening mammogram for malignant neoplasm of breast: Secondary | ICD-10-CM | POA: Diagnosis not present

## 2024-03-16 DIAGNOSIS — M810 Age-related osteoporosis without current pathological fracture: Secondary | ICD-10-CM | POA: Diagnosis not present

## 2024-03-16 NOTE — Progress Notes (Signed)
 Patient is in office today for a nurse visit for  Prolia injection . Patient Injection was given in the  Left arm. Patient tolerated injection well.

## 2024-03-24 ENCOUNTER — Ambulatory Visit: Attending: Cardiology

## 2024-03-24 DIAGNOSIS — R011 Cardiac murmur, unspecified: Secondary | ICD-10-CM | POA: Diagnosis not present

## 2024-03-25 ENCOUNTER — Other Ambulatory Visit: Payer: Self-pay | Admitting: Nurse Practitioner

## 2024-03-25 LAB — ECHOCARDIOGRAM COMPLETE
AR max vel: 2.38 cm2
AV Area VTI: 2.46 cm2
AV Area mean vel: 2.69 cm2
AV Mean grad: 9 mmHg
AV Peak grad: 20.8 mmHg
Ao pk vel: 2.28 m/s
Area-P 1/2: 3.1 cm2
Calc EF: 68 %
MV VTI: 3.59 cm2
S' Lateral: 2.4 cm
Single Plane A2C EF: 66.7 %
Single Plane A4C EF: 67.9 %

## 2024-04-01 ENCOUNTER — Ambulatory Visit: Payer: Self-pay | Admitting: *Deleted

## 2024-04-07 DIAGNOSIS — B001 Herpesviral vesicular dermatitis: Secondary | ICD-10-CM | POA: Diagnosis not present

## 2024-04-07 DIAGNOSIS — L570 Actinic keratosis: Secondary | ICD-10-CM | POA: Diagnosis not present

## 2024-05-29 ENCOUNTER — Other Ambulatory Visit: Payer: Self-pay | Admitting: Nurse Practitioner

## 2024-05-29 DIAGNOSIS — E1169 Type 2 diabetes mellitus with other specified complication: Secondary | ICD-10-CM

## 2024-05-29 DIAGNOSIS — E119 Type 2 diabetes mellitus without complications: Secondary | ICD-10-CM

## 2024-05-29 DIAGNOSIS — I10 Essential (primary) hypertension: Secondary | ICD-10-CM

## 2024-06-03 ENCOUNTER — Encounter: Payer: Self-pay | Admitting: Nurse Practitioner

## 2024-06-03 ENCOUNTER — Ambulatory Visit (INDEPENDENT_AMBULATORY_CARE_PROVIDER_SITE_OTHER): Payer: Medicare HMO | Admitting: Nurse Practitioner

## 2024-06-03 VITALS — BP 109/57 | HR 69 | Temp 97.0°F | Ht 64.0 in | Wt 123.0 lb

## 2024-06-03 DIAGNOSIS — M810 Age-related osteoporosis without current pathological fracture: Secondary | ICD-10-CM | POA: Diagnosis not present

## 2024-06-03 DIAGNOSIS — E785 Hyperlipidemia, unspecified: Secondary | ICD-10-CM | POA: Diagnosis not present

## 2024-06-03 DIAGNOSIS — E1169 Type 2 diabetes mellitus with other specified complication: Secondary | ICD-10-CM

## 2024-06-03 DIAGNOSIS — K219 Gastro-esophageal reflux disease without esophagitis: Secondary | ICD-10-CM

## 2024-06-03 DIAGNOSIS — E119 Type 2 diabetes mellitus without complications: Secondary | ICD-10-CM

## 2024-06-03 DIAGNOSIS — E1143 Type 2 diabetes mellitus with diabetic autonomic (poly)neuropathy: Secondary | ICD-10-CM

## 2024-06-03 DIAGNOSIS — Z0001 Encounter for general adult medical examination with abnormal findings: Secondary | ICD-10-CM | POA: Diagnosis not present

## 2024-06-03 DIAGNOSIS — Z7984 Long term (current) use of oral hypoglycemic drugs: Secondary | ICD-10-CM | POA: Diagnosis not present

## 2024-06-03 DIAGNOSIS — I10 Essential (primary) hypertension: Secondary | ICD-10-CM

## 2024-06-03 DIAGNOSIS — D509 Iron deficiency anemia, unspecified: Secondary | ICD-10-CM

## 2024-06-03 DIAGNOSIS — Z0000 Encounter for general adult medical examination without abnormal findings: Secondary | ICD-10-CM

## 2024-06-03 LAB — CBC WITH DIFFERENTIAL/PLATELET

## 2024-06-03 LAB — BAYER DCA HB A1C WAIVED: HB A1C (BAYER DCA - WAIVED): 6.2 % — ABNORMAL HIGH (ref 4.8–5.6)

## 2024-06-03 MED ORDER — LISINOPRIL 5 MG PO TABS: 5.0000 mg | ORAL_TABLET | Freq: Every day | ORAL | 1 refills | Status: DC

## 2024-06-03 MED ORDER — GABAPENTIN 100 MG PO CAPS: 100.0000 mg | ORAL_CAPSULE | Freq: Three times a day (TID) | ORAL | 1 refills | Status: DC

## 2024-06-03 MED ORDER — METFORMIN HCL 500 MG PO TABS: 500.0000 mg | ORAL_TABLET | Freq: Two times a day (BID) | ORAL | 1 refills | Status: DC

## 2024-06-03 MED ORDER — SIMVASTATIN 20 MG PO TABS: 20.0000 mg | ORAL_TABLET | Freq: Every day | ORAL | 1 refills | Status: DC

## 2024-06-03 MED ORDER — FERROUS SULFATE 325 (65 FE) MG PO TABS: 325.0000 mg | ORAL_TABLET | Freq: Every day | ORAL | 1 refills | Status: DC

## 2024-06-03 NOTE — Progress Notes (Signed)
 Subjective:    Patient ID: Lindsay Villarreal, female    DOB: 1946/10/22, 78 y.o.   MRN: [id number]   Chief Complaint: annual physical  HPI:  Lindsay Villarreal is a 78 y.o. who identifies as a female who was assigned female at birth.   Social history: Lives with: husband Work history: retired   Water engineer in today for follow up of the following chronic medical issues:  1. Primary hypertension No c/o chest pain, sob or headache. Doe snot check blood pressure at home. BP Readings from Last 3 Encounters:  03/09/24 116/70  12/07/23 131/67  05/29/23 120/63     2. Mixed hyperlipidemia Does try to watch diet but doe snot do any dedicated exercise. Lab Results  Component Value Date   CHOL 130 12/07/2023   HDL 49 12/07/2023   LDLCALC 53 12/07/2023   TRIG 166 (H) 12/07/2023   CHOLHDL 2.7 12/07/2023     3. Diabetes mellitus treated with oral medication (HCC) Fasting blood sugars are running 110-135. Denies any low blood sugars. Lab Results  Component Value Date   HGBA1C 5.9 (H) 12/07/2023    4. Diabetic autonomic neuropathy associated with type 2 diabetes mellitus (HCC) Has tingling in bil feet. No change  5. Gastroesophageal reflux disease without esophagitis Uses OTC meds as needed  6. Iron  deficiency anemia, unspecified iron  deficiency anemia type On daily iron  supplement Lab Results  Component Value Date   HGB 13.5 12/07/2023    7. Age-related osteoporosis without current pathological fracture Last dexascan was done on 10/16/22. Her t score was -2.1  Wt Readings from Last 3 Encounters:  06/03/24 123 lb (55.8 kg)  03/09/24 126 lb 4.8 oz (57.3 kg)  01/08/24 120 lb (54.4 kg)     New complaints: None today  Allergies  Allergen Reactions   Codeine Other (See Comments)    Hallucinations.   Outpatient Encounter Medications as of 06/03/2024  Medication Sig   b complex vitamins capsule Take 1 capsule by mouth daily.   Biotin 89999 MCG TABS Take 10,000 mcg by mouth  daily.   blood glucose meter kit and supplies Dispense based on patient and insurance preference. Use up to four times daily as directed. Dx E11.8   Calcium Carb-Cholecalciferol (CALCIUM 600 + D PO) Take 1 tablet by mouth daily.   Cholecalciferol (VITAMIN D3) 10 MCG (400 UNIT) tablet Take 400 Units by mouth daily.   denosumab  (PROLIA ) 60 MG/ML SOLN injection Inject 60 mg into the skin every 6 (six) months. Administer in upper arm, thigh, or abdomen   diphenhydrAMINE  HCl, Sleep, (ZZZQUIL) 50 MG/30ML LIQD Take 50 mg by mouth at bedtime.   ferrous sulfate  (FEROSUL) 325 (65 FE) MG tablet Take 1 tablet (325 mg total) by mouth daily.   gabapentin  (NEURONTIN ) 100 MG capsule Take 1 capsule (100 mg total) by mouth 3 (three) times daily.   glucose blood (ONETOUCH VERIO) test strip USE TO CHECK BLOOD SUGAR 4 TIMES A DAY Dx E11.8   lisinopril  (ZESTRIL ) 5 MG tablet Take 1 tablet by mouth once daily   Menthol -Methyl Salicylate (ARTHRITIS PAIN RELIEF EX) Apply 1 Application topically daily as needed (pain).   Menthol -Zinc Oxide (CALMOSEPTINE) 0.44-20.6 % OINT Apply to affected area BID   metFORMIN  (GLUCOPHAGE ) 500 MG tablet Take 1 tablet by mouth twice daily   OneTouch Delica Lancets 33G MISC USE ONE  TO CHECK GLUCOSE ONCE DAILY Dx E11.40   simvastatin  (ZOCOR ) 20 MG tablet Take 1 tablet by mouth once daily  SSD 1 % cream Apply 1 Application topically daily.   valACYclovir  (VALTREX ) 500 MG tablet Take 500 mg by mouth 2 (two) times daily.   Zinc 50 MG TABS Take 50 mg by mouth daily.   No facility-administered encounter medications on file as of 06/03/2024.    Past Surgical History:  Procedure Laterality Date   APPENDECTOMY  11/17/1976   BLADDER REPAIR     CATARACT EXTRACTION W/ INTRAOCULAR LENS IMPLANT  2010 (left), 2013 (right)   COLONOSCOPY  04/30/2015   Dr.Perry   COLONOSCOPY  2013   EYE SURGERY     POLYPECTOMY     TOTAL KNEE ARTHROPLASTY Left 12/01/2022   Procedure: TOTAL KNEE ARTHROPLASTY;   Surgeon: Melodi Lerner, MD;  Location: [facility];  Service: Orthopedics;  Laterality: Left;   TUBAL LIGATION     VAGINAL HYSTERECTOMY  11/17/1976   XI ROBOTIC ASSISTED INGUINAL HERNIA REPAIR WITH MESH Bilateral 02/22/2021   Procedure: XI ROBOTIC ASSISTED BILATERAL INGUINAL HERNIA REPAIR;  Surgeon: Signe Mitzie LABOR, MD;  Location: [facility];  Service: General;  Laterality: Bilateral;  2.5 HOURS    Family History  Problem Relation Age of Onset   Heart attack Mother    Heart failure Mother    Stomach cancer Father    Pancreatic cancer Father    Liver cancer Father    Other Sister 38       Cancer of Bile Duct   Breast cancer Sister    Diabetes Sister    Brain cancer Sister 20   Sarcoidosis Sister    Colon cancer Neg Hx    Esophageal cancer Neg Hx    Rectal cancer Neg Hx    Colon polyps Neg Hx       Controlled substance contract: n/a      Review of Systems  Constitutional:  Negative for diaphoresis.  Eyes:  Negative for pain.  Respiratory:  Negative for shortness of breath.   Cardiovascular:  Negative for chest pain, palpitations and leg swelling.  Gastrointestinal:  Negative for abdominal pain.  Endocrine: Negative for polydipsia.  Skin:  Negative for rash.  Neurological:  Negative for dizziness, weakness and headaches.  Hematological:  Does not bruise/bleed easily.  All other systems reviewed and are negative.      Objective:   Physical Exam Vitals and nursing note reviewed.  Constitutional:      General: She is not in acute distress.    Appearance: Normal appearance. She is well-developed.  HENT:     Head: Normocephalic.     Right Ear: Tympanic membrane normal.     Left Ear: Tympanic membrane normal.     Nose: Nose normal.     Mouth/Throat:     Mouth: Mucous membranes are moist.  Eyes:     Pupils: Pupils are equal, round, and reactive to light.  Neck:     Vascular: No carotid bruit or JVD.  Cardiovascular:     Rate and Rhythm: Normal rate and regular  rhythm.     Heart sounds: Murmur (3/6 systolic) heard.  Pulmonary:     Effort: Pulmonary effort is normal. No respiratory distress.     Breath sounds: Normal breath sounds. No wheezing or rales.  Chest:     Chest wall: No tenderness.  Abdominal:     General: Bowel sounds are normal. There is no distension or abdominal bruit.     Palpations: Abdomen is soft. There is no hepatomegaly, splenomegaly, mass or pulsatile mass.     Tenderness: There  is no abdominal tenderness.  Musculoskeletal:        General: Normal range of motion.     Cervical back: Normal range of motion and neck supple.  Lymphadenopathy:     Cervical: No cervical adenopathy.  Skin:    General: Skin is warm and dry.  Neurological:     Mental Status: She is alert and oriented to person, place, and time.     Deep Tendon Reflexes: Reflexes are normal and symmetric.  Psychiatric:        Behavior: Behavior normal.        Thought Content: Thought content normal.        Judgment: Judgment normal.     BP (!) 109/57   Pulse 69   Temp (!) 97 F (36.1 C) (Temporal)   Ht 5' 4 (1.626 m)   Wt 123 lb (55.8 kg)   SpO2 99%   BMI 21.11 kg/m   Hgba1c discussed at appointment 6.2%      Assessment & Plan:   Lindsay Villarreal comes in today with chief complaint of annual physical  Diagnosis and orders addressed:  1. Primary hypertension Low sodium diet - CBC with Differential/Platelet - CMP14+EGFR - lisinopril  (ZESTRIL ) 5 MG tablet; Take 1 tablet (5 mg total) by mouth daily.  Dispense: 90 tablet; Refill: 1  2. Mixed hyperlipidemia Low fat diet - Lipid panel - simvastatin  (ZOCOR ) 20 MG tablet; Take 1 tablet (20 mg total) by mouth daily.  Dispense: 90 tablet; Refill: 1  3. Diabetes mellitus treated with oral medication (HCC) Continue to watch carbs in diet - Bayer DCA Hb A1c Waived - Microalbumin / creatinine urine ratio - metFORMIN  (GLUCOPHAGE ) 500 MG tablet; Take 1 tablet (500 mg total) by mouth 2 (two) times  daily.  Dispense: 180 tablet; Refill: 1  4. Diabetic autonomic neuropathy associated with type 2 diabetes mellitus (HCC) Do not go bare footed - gabapentin  (NEURONTIN ) 100 MG capsule; Take 1 capsule (100 mg total) by mouth 3 (three) times daily.  Dispense: 270 capsule; Refill: 1  5. Gastroesophageal reflux disease without esophagitis Avoid spicy foods Do not eat 2 hours prior to bedtime   6. Iron  deficiency anemia, unspecified iron  deficiency anemia type Continue iron  supplement - ferrous sulfate  (FEROSUL) 325 (65 FE) MG tablet; Take 1 tablet (325 mg total) by mouth daily.  Dispense: 90 tablet; Refill: 1  7. Age-related osteoporosis without current pathological fracture Weight bearing exercises   Labs pending Health Maintenance reviewed Diet and exercise encouraged  Follow up plan: 6 months   Mary-Margaret Gladis, FNP

## 2024-06-03 NOTE — Patient Instructions (Signed)
 Fall Prevention in the Home, Adult Falls can cause injuries and can happen to people of all ages. There are many things you can do to make your home safer and to help prevent falls. What actions can I take to prevent falls? General information Use good lighting in all rooms. Make sure to: Replace any light bulbs that burn out. Turn on the lights in dark areas and use night-lights. Keep items that you use often in easy-to-reach places. Lower the shelves around your home if needed. Move furniture so that there are clear paths around it. Do not use throw rugs or other things on the floor that can make you trip. If any of your floors are uneven, fix them. Add color or contrast paint or tape to clearly mark and help you see: Grab bars or handrails. First and last steps of staircases. Where the edge of each step is. If you use a ladder or stepladder: Make sure that it is fully opened. Do not climb a closed ladder. Make sure the sides of the ladder are locked in place. Have someone hold the ladder while you use it. Know where your pets are as you move through your home. What can I do in the bathroom?     Keep the floor dry. Clean up any water on the floor right away. Remove soap buildup in the bathtub or shower. Buildup makes bathtubs and showers slippery. Use non-skid mats or decals on the floor of the bathtub or shower. Attach bath mats securely with double-sided, non-slip rug tape. If you need to sit down in the shower, use a non-slip stool. Install grab bars by the toilet and in the bathtub and shower. Do not use towel bars as grab bars. What can I do in the bedroom? Make sure that you have a light by your bed that is easy to reach. Do not use any sheets or blankets on your bed that hang to the floor. Have a firm chair or bench with side arms that you can use for support when you get dressed. What can I do in the kitchen? Clean up any spills right away. If you need to reach something  above you, use a step stool with a grab bar. Keep electrical cords out of the way. Do not use floor polish or wax that makes floors slippery. What can I do with my stairs? Do not leave anything on the stairs. Make sure that you have a light switch at the top and the bottom of the stairs. Make sure that there are handrails on both sides of the stairs. Fix handrails that are broken or loose. Install non-slip stair treads on all your stairs if they do not have carpet. Avoid having throw rugs at the top or bottom of the stairs. Choose a carpet that does not hide the edge of the steps on the stairs. Make sure that the carpet is firmly attached to the stairs. Fix carpet that is loose or worn. What can I do on the outside of my home? Use bright outdoor lighting. Fix the edges of walkways and driveways and fix any cracks. Clear paths of anything that can make you trip, such as tools or rocks. Add color or contrast paint or tape to clearly mark and help you see anything that might make you trip as you walk through a door, such as a raised step or threshold. Trim any bushes or trees on paths to your home. Check to see if handrails are loose  or broken and that both sides of all steps have handrails. Install guardrails along the edges of any raised decks and porches. Have leaves, snow, or ice cleared regularly. Use sand, salt, or ice melter on paths if you live where there is ice and snow during the winter. Clean up any spills in your garage right away. This includes grease or oil spills. What other actions can I take? Review your medicines with your doctor. Some medicines can cause dizziness or changes in blood pressure, which increase your risk of falling. Wear shoes that: Have a low heel. Do not wear high heels. Have rubber bottoms and are closed at the toe. Feel good on your feet and fit well. Use tools that help you move around if needed. These include: Canes. Walkers. Scooters. Crutches. Ask  your doctor what else you can do to help prevent falls. This may include seeing a physical therapist to learn to do exercises to move better and get stronger. Where to find more information Centers for Disease Control and Prevention, STEADI: TonerPromos.no [employer] on Aging: BaseRingTones.pl National Institute on Aging: BaseRingTones.pl Contact a doctor if: You are afraid of falling at home. You feel weak, drowsy, or dizzy at home. You fall at home. Get help right away if you: Lose consciousness or have trouble moving after a fall. Have a fall that causes a head injury. These symptoms may be an emergency. Get help right away. Call 911. Do not wait to see if the symptoms will go away. Do not drive yourself to the hospital. This information is not intended to replace advice given to you by your health care provider. Make sure you discuss any questions you have with your health care provider. Document Revised: 07/07/2022 Document Reviewed: 07/07/2022 Elsevier Patient Education  2024 [employer].

## 2024-06-04 LAB — MICROALBUMIN / CREATININE URINE RATIO
Creatinine, Urine: 123.7 mg/dL
Microalb/Creat Ratio: 13 mg/g{creat} (ref 0–29)
Microalbumin, Urine: 16.5 ug/mL

## 2024-06-06 LAB — CMP14+EGFR
ALT: 11 IU/L (ref 0–32)
AST: 17 IU/L (ref 0–40)
Albumin: 4.3 g/dL (ref 3.8–4.8)
Alkaline Phosphatase: 51 IU/L (ref 44–121)
BUN/Creatinine Ratio: 20 (ref 12–28)
BUN: 13 mg/dL (ref 8–27)
Bilirubin Total: 0.4 mg/dL (ref 0.0–1.2)
CO2: 24 mmol/L (ref 20–29)
Calcium: 9.9 mg/dL (ref 8.7–10.3)
Chloride: 100 mmol/L (ref 96–106)
Creatinine, Ser: 0.66 mg/dL (ref 0.57–1.00)
Globulin, Total: 2 g/dL (ref 1.5–4.5)
Glucose: 113 mg/dL — ABNORMAL HIGH (ref 70–99)
Potassium: 4.6 mmol/L (ref 3.5–5.2)
Sodium: 137 mmol/L (ref 134–144)
Total Protein: 6.3 g/dL (ref 6.0–8.5)
eGFR: 90 mL/min/1.73 (ref >59)

## 2024-06-06 LAB — CBC WITH DIFFERENTIAL/PLATELET
Basos: 1
EOS (ABSOLUTE): 0.1 x10E3/uL (ref 0.0–0.2)
Eos: 1
Hematocrit: 40.9 (ref 34.0–46.6)
Hemoglobin: 13.4 g/dL (ref 11.1–15.9)
Immature Granulocytes: 0
Immature Granulocytes: 0 x10E3/uL (ref 0.0–0.1)
Lymphs: 33
MCH: 30.9 pg (ref 26.6–33.0)
MCHC: 32.8 g/dL (ref 31.5–35.7)
MCV: 94 fL (ref 79–97)
Monocytes Absolute: 0.1 x10E3/uL (ref 0.0–0.4)
Monocytes Absolute: 0.6 x10E3/uL (ref 0.1–0.9)
Monocytes: 8
Neutrophils Absolute: 2.5 x10E3/uL (ref 0.7–3.1)
Neutrophils Absolute: 4.2 x10E3/uL (ref 1.4–7.0)
Neutrophils: 57
Platelets: 333 x10E3/uL (ref 150–450)
RBC: 4.34 x10E6/uL (ref 3.77–5.28)
RDW: 12.1 (ref 11.7–15.4)
WBC: 7.4 x10E3/uL (ref 3.4–10.8)

## 2024-06-06 LAB — VITAMIN B12: Vitamin B-12: 495 pg/mL (ref 232–1245)

## 2024-06-06 LAB — LIPID PANEL
Chol/HDL Ratio: 2.5 ratio (ref 0.0–4.4)
Cholesterol, Total: 119 mg/dL (ref 100–199)
HDL: 48 mg/dL (ref >39)
LDL Chol Calc (NIH): 47 mg/dL (ref 0–99)
Triglycerides: 136 mg/dL (ref 0–149)
VLDL Cholesterol Cal: 24 mg/dL (ref 5–40)

## 2024-06-13 ENCOUNTER — Ambulatory Visit: Payer: Self-pay | Admitting: Nurse Practitioner

## 2024-07-11 DIAGNOSIS — D492 Neoplasm of unspecified behavior of bone, soft tissue, and skin: Secondary | ICD-10-CM | POA: Diagnosis not present

## 2024-07-11 DIAGNOSIS — C44619 Basal cell carcinoma of skin of left upper limb, including shoulder: Secondary | ICD-10-CM | POA: Diagnosis not present

## 2024-07-11 DIAGNOSIS — B001 Herpesviral vesicular dermatitis: Secondary | ICD-10-CM | POA: Diagnosis not present

## 2024-08-03 ENCOUNTER — Telehealth: Payer: Self-pay

## 2024-08-03 DIAGNOSIS — M810 Age-related osteoporosis without current pathological fracture: Secondary | ICD-10-CM

## 2024-08-03 MED ORDER — DENOSUMAB 60 MG/ML ~~LOC~~ SOSY
60.0000 mg | PREFILLED_SYRINGE | Freq: Once | SUBCUTANEOUS | Status: AC
  Administered 2024-09-16: 60 mg via SUBCUTANEOUS

## 2024-08-03 NOTE — Telephone Encounter (Signed)
 Sent for VOB.

## 2024-08-04 ENCOUNTER — Other Ambulatory Visit (HOSPITAL_COMMUNITY): Payer: Self-pay

## 2024-08-04 ENCOUNTER — Telehealth: Payer: Self-pay

## 2024-08-04 NOTE — Telephone Encounter (Signed)
 Pt ready for scheduling for PROLIA  on or after : 09/15/24  Option# 1: Buy/Bill (Office supplied medication)  Out-of-pocket cost due at time of clinic visit: $357  Number of injection/visits approved: 2  Primary: [employer] Prolia  co-insurance: 20% Admin fee co-insurance: 20%  Secondary: --- Prolia  co-insurance:  Admin fee co-insurance:   Medical Benefit Details: Date Benefits were checked: 08/04/24 Deductible: NO/ Coinsurance: 20%/ Admin Fee: 20%  Prior Auth: APPROVED PA# [id number] Expiration Date: 02/19/24-02/18/25   # of doses approved: 2 ----------------------------------------------------------------------- Option# 2- Med Obtained from pharmacy:  Pharmacy benefit: Copay $645.79 (Paid to pharmacy) Admin Fee: 20% (Pay at clinic)  Prior Auth: N/A PA# Expiration Date:   # of doses approved:   If patient wants fill through the pharmacy benefit please send prescription to: [facility], and include estimated need by date in rx notes. Pharmacy will ship medication directly to the office.  Patient NOT eligible for Prolia  Copay Card. Copay Card can make patient's cost as little as $25. Link to apply: [email or web address]://www.amgensupportplus.[email or web address]  ** This summary of benefits is an estimation of the patient's out-of-pocket cost. Exact cost may very based on individual plan coverage.

## 2024-08-04 NOTE — Telephone Encounter (Signed)
 Prolia  VOB initiated via [email or web address]  Next Prolia  inj DUE: 09/15/24

## 2024-08-04 NOTE — Telephone Encounter (Signed)
 SABRA

## 2024-08-15 DIAGNOSIS — C44619 Basal cell carcinoma of skin of left upper limb, including shoulder: Secondary | ICD-10-CM | POA: Diagnosis not present

## 2024-08-15 DIAGNOSIS — L905 Scar conditions and fibrosis of skin: Secondary | ICD-10-CM | POA: Diagnosis not present

## 2024-09-16 ENCOUNTER — Ambulatory Visit (INDEPENDENT_AMBULATORY_CARE_PROVIDER_SITE_OTHER): Admitting: *Deleted

## 2024-09-16 DIAGNOSIS — M810 Age-related osteoporosis without current pathological fracture: Secondary | ICD-10-CM | POA: Diagnosis not present

## 2024-09-16 NOTE — Progress Notes (Signed)
 Patient is in office today for a nurse visit for  Prolia injection . Patient Injection was given in the  Left arm. Patient tolerated injection well.

## 2024-11-28 ENCOUNTER — Ambulatory Visit (INDEPENDENT_AMBULATORY_CARE_PROVIDER_SITE_OTHER): Payer: Self-pay | Admitting: Nurse Practitioner

## 2024-11-28 ENCOUNTER — Encounter: Payer: Self-pay | Admitting: Nurse Practitioner

## 2024-11-28 ENCOUNTER — Ambulatory Visit: Payer: Self-pay | Admitting: Nurse Practitioner

## 2024-11-28 ENCOUNTER — Other Ambulatory Visit

## 2024-11-28 ENCOUNTER — Other Ambulatory Visit: Payer: Self-pay | Admitting: Nurse Practitioner

## 2024-11-28 VITALS — BP 118/68 | HR 73 | Temp 97.1°F | Ht 64.0 in | Wt 124.0 lb

## 2024-11-28 DIAGNOSIS — Z13820 Encounter for screening for osteoporosis: Secondary | ICD-10-CM

## 2024-11-28 DIAGNOSIS — M810 Age-related osteoporosis without current pathological fracture: Secondary | ICD-10-CM

## 2024-11-28 DIAGNOSIS — I10 Essential (primary) hypertension: Secondary | ICD-10-CM

## 2024-11-28 DIAGNOSIS — E1169 Type 2 diabetes mellitus with other specified complication: Secondary | ICD-10-CM

## 2024-11-28 DIAGNOSIS — E119 Type 2 diabetes mellitus without complications: Secondary | ICD-10-CM

## 2024-11-28 DIAGNOSIS — K219 Gastro-esophageal reflux disease without esophagitis: Secondary | ICD-10-CM

## 2024-11-28 DIAGNOSIS — D509 Iron deficiency anemia, unspecified: Secondary | ICD-10-CM

## 2024-11-28 DIAGNOSIS — Z780 Asymptomatic menopausal state: Secondary | ICD-10-CM

## 2024-11-28 DIAGNOSIS — E1143 Type 2 diabetes mellitus with diabetic autonomic (poly)neuropathy: Secondary | ICD-10-CM

## 2024-11-28 DIAGNOSIS — E785 Hyperlipidemia, unspecified: Secondary | ICD-10-CM

## 2024-11-28 DIAGNOSIS — Z7984 Long term (current) use of oral hypoglycemic drugs: Secondary | ICD-10-CM

## 2024-11-28 LAB — CMP14+EGFR
ALT: 13 IU/L (ref 0–32)
AST: 15 IU/L (ref 0–40)
Albumin: 4.1 g/dL (ref 3.8–4.8)
Alkaline Phosphatase: 53 IU/L (ref 49–135)
BUN/Creatinine Ratio: 19 (ref 12–28)
BUN: 11 mg/dL (ref 8–27)
Bilirubin Total: 0.3 mg/dL (ref 0.0–1.2)
CO2: 23 mmol/L (ref 20–29)
Calcium: 9.1 mg/dL (ref 8.7–10.3)
Chloride: 102 mmol/L (ref 96–106)
Creatinine, Ser: 0.57 mg/dL (ref 0.57–1.00)
Globulin, Total: 2 g/dL (ref 1.5–4.5)
Glucose: 112 mg/dL — ABNORMAL HIGH (ref 70–99)
Potassium: 4.1 mmol/L (ref 3.5–5.2)
Sodium: 139 mmol/L (ref 134–144)
Total Protein: 6.1 g/dL (ref 6.0–8.5)
eGFR: 93 mL/min/1.73

## 2024-11-28 LAB — CBC WITH DIFFERENTIAL/PLATELET
Basophils Absolute: 0.1 x10E3/uL (ref 0.0–0.2)
Basos: 1 %
EOS (ABSOLUTE): 0.1 x10E3/uL (ref 0.0–0.4)
Eos: 1 %
Hematocrit: 40.2 % (ref 34.0–46.6)
Hemoglobin: 13 g/dL (ref 11.1–15.9)
Immature Grans (Abs): 0 x10E3/uL (ref 0.0–0.1)
Immature Granulocytes: 0 %
Lymphocytes Absolute: 2.7 x10E3/uL (ref 0.7–3.1)
Lymphs: 31 %
MCH: 30.2 pg (ref 26.6–33.0)
MCHC: 32.3 g/dL (ref 31.5–35.7)
MCV: 94 fL (ref 79–97)
Monocytes Absolute: 0.6 x10E3/uL (ref 0.1–0.9)
Monocytes: 7 %
Neutrophils Absolute: 5.1 x10E3/uL (ref 1.4–7.0)
Neutrophils: 60 %
Platelets: 351 x10E3/uL (ref 150–450)
RBC: 4.3 x10E6/uL (ref 3.77–5.28)
RDW: 12.1 % (ref 11.7–15.4)
WBC: 8.5 x10E3/uL (ref 3.4–10.8)

## 2024-11-28 LAB — BAYER DCA HB A1C WAIVED: HB A1C (BAYER DCA - WAIVED): 6.1 % — ABNORMAL HIGH (ref 4.8–5.6)

## 2024-11-28 LAB — LIPID PANEL
Chol/HDL Ratio: 2.7 ratio (ref 0.0–4.4)
Cholesterol, Total: 129 mg/dL (ref 100–199)
HDL: 48 mg/dL
LDL Chol Calc (NIH): 53 mg/dL (ref 0–99)
Triglycerides: 169 mg/dL — ABNORMAL HIGH (ref 0–149)
VLDL Cholesterol Cal: 28 mg/dL (ref 5–40)

## 2024-11-28 MED ORDER — METFORMIN HCL 500 MG PO TABS: 500.0000 mg | ORAL_TABLET | Freq: Two times a day (BID) | ORAL | 1 refills | Status: AC

## 2024-11-28 MED ORDER — SIMVASTATIN 20 MG PO TABS: 20.0000 mg | ORAL_TABLET | Freq: Every day | ORAL | 1 refills | Status: AC

## 2024-11-28 MED ORDER — GABAPENTIN 100 MG PO CAPS: 100.0000 mg | ORAL_CAPSULE | Freq: Three times a day (TID) | ORAL | 1 refills | Status: AC

## 2024-11-28 MED ORDER — LISINOPRIL 5 MG PO TABS: 5.0000 mg | ORAL_TABLET | Freq: Every day | ORAL | 1 refills | Status: AC

## 2024-11-28 MED ORDER — FERROUS SULFATE 325 (65 FE) MG PO TABS: 325.0000 mg | ORAL_TABLET | Freq: Every day | ORAL | 1 refills | Status: AC

## 2024-11-28 NOTE — Patient Instructions (Signed)

## 2024-11-28 NOTE — Progress Notes (Signed)
 "  Subjective:    Patient ID: Lindsay Villarreal, female    DOB: Apr 14, 1946, 79 y.o.   MRN: [id number]   Chief Complaint: medical management of chronic issues     HPI:  Lindsay Villarreal is a 79 y.o. who identifies as a female who was assigned female at birth.   Social history: Lives with: husband Work history: retired   Water Engineer in today for follow up of the following chronic medical issues:  1. Primary hypertension No c/o chest pain, sob or headache. Doe snot check blood pressure at home. BP Readings from Last 3 Encounters:  06/03/24 (!) 109/57  03/09/24 116/70  12/07/23 131/67     2. Mixed hyperlipidemia Does try to watch diet but doe snot do any dedicated exercise. Lab Results  Component Value Date   CHOL 119 06/03/2024   HDL 48 06/03/2024   LDLCALC 47 06/03/2024   TRIG 136 06/03/2024   CHOLHDL 2.5 06/03/2024     3. Diabetes mellitus treated with oral medication (HCC) Fasting blood sugars are running 110-135. Denies any low blood sugars. Lab Results  Component Value Date   HGBA1C 6.2 (H) 06/03/2024    4. Diabetic autonomic neuropathy associated with type 2 diabetes mellitus (HCC) Has tingling in bil feet. No change  5. Gastroesophageal reflux disease without esophagitis Uses OTC meds as needed  6. Iron  deficiency anemia, unspecified iron  deficiency anemia type On daily iron  supplement Lab Results  Component Value Date   HGB 13.4 06/03/2024    7. Age-related osteoporosis without current pathological fracture Last dexascan was done on 10/16/22. Her t score was -2.1- will repat dexascan today    New complaints: None today   Allergies  Allergen Reactions   Codeine Other (See Comments)    Hallucinations.   Outpatient Encounter Medications as of 11/28/2024  Medication Sig   b complex vitamins capsule Take 1 capsule by mouth daily.   blood glucose meter kit and supplies Dispense based on patient and insurance preference. Use up to four times daily as  directed. Dx E11.8   Calcium Carb-Cholecalciferol (CALCIUM 600 + D PO) Take 1 tablet by mouth daily.   Cholecalciferol (VITAMIN D3) 10 MCG (400 UNIT) tablet Take 400 Units by mouth daily.   diphenhydrAMINE  HCl, Sleep, (ZZZQUIL) 50 MG/30ML LIQD Take 50 mg by mouth at bedtime.   ferrous sulfate  (FEROSUL) 325 (65 FE) MG tablet Take 1 tablet (325 mg total) by mouth daily.   gabapentin  (NEURONTIN ) 100 MG capsule Take 1 capsule (100 mg total) by mouth 3 (three) times daily.   glucose blood (ONETOUCH VERIO) test strip USE TO CHECK BLOOD SUGAR 4 TIMES A DAY Dx E11.8   lisinopril  (ZESTRIL ) 5 MG tablet Take 1 tablet (5 mg total) by mouth daily.   Menthol -Methyl Salicylate (ARTHRITIS PAIN RELIEF EX) Apply 1 Application topically daily as needed (pain).   Menthol -Zinc Oxide (CALMOSEPTINE) 0.44-20.6 % OINT Apply to affected area BID   metFORMIN  (GLUCOPHAGE ) 500 MG tablet Take 1 tablet (500 mg total) by mouth 2 (two) times daily.   OneTouch Delica Lancets 33G MISC USE ONE  TO CHECK GLUCOSE ONCE DAILY Dx E11.40   simvastatin  (ZOCOR ) 20 MG tablet Take 1 tablet (20 mg total) by mouth daily.   SSD 1 % cream Apply 1 Application topically daily.   Zinc 50 MG TABS Take 50 mg by mouth daily.   No facility-administered encounter medications on file as of 11/28/2024.    Past Surgical History:  Procedure Laterality Date  APPENDECTOMY  11/17/1976   BLADDER REPAIR     CATARACT EXTRACTION W/ INTRAOCULAR LENS IMPLANT  2010 (left), 2013 (right)   COLONOSCOPY  04/30/2015   Dr.Perry   COLONOSCOPY  2013   EYE SURGERY     POLYPECTOMY     TOTAL KNEE ARTHROPLASTY Left 12/01/2022   Procedure: TOTAL KNEE ARTHROPLASTY;  Surgeon: Melodi Lerner, MD;  Location: [facility];  Service: Orthopedics;  Laterality: Left;   TUBAL LIGATION     VAGINAL HYSTERECTOMY  11/17/1976   XI ROBOTIC ASSISTED INGUINAL HERNIA REPAIR WITH MESH Bilateral 02/22/2021   Procedure: XI ROBOTIC ASSISTED BILATERAL INGUINAL HERNIA REPAIR;  Surgeon: Signe Mitzie LABOR, MD;  Location: [facility];  Service: General;  Laterality: Bilateral;  2.5 HOURS    Family History  Problem Relation Age of Onset   Heart attack Mother    Heart failure Mother    Stomach cancer Father    Pancreatic cancer Father    Liver cancer Father    Other Sister 65       Cancer of Bile Duct   Breast cancer Sister    Diabetes Sister    Brain cancer Sister 84   Sarcoidosis Sister    Colon cancer Neg Hx    Esophageal cancer Neg Hx    Rectal cancer Neg Hx    Colon polyps Neg Hx       Controlled substance contract: n/a      Review of Systems  Constitutional:  Negative for diaphoresis.  Eyes:  Negative for pain.  Respiratory:  Negative for shortness of breath.   Cardiovascular:  Negative for chest pain, palpitations and leg swelling.  Gastrointestinal:  Negative for abdominal pain.  Endocrine: Negative for polydipsia.  Skin:  Negative for rash.  Neurological:  Negative for dizziness, weakness and headaches.  Hematological:  Does not bruise/bleed easily.  All other systems reviewed and are negative.      Objective:   Physical Exam Vitals and nursing note reviewed.  Constitutional:      General: She is not in acute distress.    Appearance: Normal appearance. She is well-developed.  HENT:     Head: Normocephalic.     Right Ear: Tympanic membrane normal.     Left Ear: Tympanic membrane normal.     Nose: Nose normal.     Mouth/Throat:     Mouth: Mucous membranes are moist.  Eyes:     Pupils: Pupils are equal, round, and reactive to light.  Neck:     Vascular: No carotid bruit or JVD.  Cardiovascular:     Rate and Rhythm: Normal rate and regular rhythm.     Heart sounds: Murmur (3/6 systolic) heard.  Pulmonary:     Effort: Pulmonary effort is normal. No respiratory distress.     Breath sounds: Normal breath sounds. No wheezing or rales.  Chest:     Chest wall: No tenderness.  Abdominal:     General: Bowel sounds are normal. There is no distension  or abdominal bruit.     Palpations: Abdomen is soft. There is no hepatomegaly, splenomegaly, mass or pulsatile mass.     Tenderness: There is no abdominal tenderness.  Musculoskeletal:        General: Normal range of motion.     Cervical back: Normal range of motion and neck supple.  Lymphadenopathy:     Cervical: No cervical adenopathy.  Skin:    General: Skin is warm and dry.     Comments: 3cm erythematous mildly  tender area on right buttocks. No drainage, no open area  Neurological:     Mental Status: She is alert and oriented to person, place, and time.     Deep Tendon Reflexes: Reflexes are normal and symmetric.  Psychiatric:        Behavior: Behavior normal.        Thought Content: Thought content normal.        Judgment: Judgment normal.     BP 118/68   Pulse 73   Temp (!) 97.1 F (36.2 C) (Temporal)   Ht 5' 4 (1.626 m)   Wt 124 lb (56.2 kg)   SpO2 97%   BMI 21.28 kg/m    Hgba1c discussed at appointment 6.1%      Assessment & Plan:   ELLENIE SALOME comes in today with chief complaint of medical management of chronic issues    Diagnosis and orders addressed:  1. Primary hypertension Low sodium diet - CBC with Differential/Platelet - CMP14+EGFR - lisinopril  (ZESTRIL ) 5 MG tablet; Take 1 tablet (5 mg total) by mouth daily.  Dispense: 90 tablet; Refill: 1  2. Mixed hyperlipidemia Low fat diet - Lipid panel - simvastatin  (ZOCOR ) 20 MG tablet; Take 1 tablet (20 mg total) by mouth daily.  Dispense: 90 tablet; Refill: 1  3. Diabetes mellitus treated with oral medication (HCC) Continue to watch carbs in diet - Bayer DCA Hb A1c Waived - Microalbumin / creatinine urine ratio - metFORMIN  (GLUCOPHAGE ) 500 MG tablet; Take 1 tablet (500 mg total) by mouth 2 (two) times daily.  Dispense: 180 tablet; Refill: 1  4. Diabetic autonomic neuropathy associated with type 2 diabetes mellitus (HCC) Do not go bare footed - gabapentin  (NEURONTIN ) 100 MG capsule; Take 1  capsule (100 mg total) by mouth 3 (three) times daily.  Dispense: 270 capsule; Refill: 1  5. Gastroesophageal reflux disease without esophagitis Avoid spicy foods Do not eat 2 hours prior to bedtime   6. Iron  deficiency anemia, unspecified iron  deficiency anemia type Continue iron  supplement - ferrous sulfate  (FEROSUL) 325 (65 FE) MG tablet; Take 1 tablet (325 mg total) by mouth daily.  Dispense: 90 tablet; Refill: 1  7. Age-related osteoporosis without current pathological fracture Weight bearing exercises  Labs pending Health Maintenance reviewed Diet and exercise encouraged  Follow up plan: 6 months   Mary-Margaret Gladis, FNP   "

## 2024-11-29 ENCOUNTER — Ambulatory Visit: Payer: Self-pay | Admitting: Nurse Practitioner

## 2024-12-21 LAB — OPHTHALMOLOGY REPORT-SCANNED

## 2025-01-10 ENCOUNTER — Ambulatory Visit: Payer: Self-pay

## 2025-05-25 ENCOUNTER — Ambulatory Visit: Admitting: Nurse Practitioner
